# Patient Record
Sex: Female | Born: 1950 | Race: White | Hispanic: No | Smoking: Current every day smoker
Health system: Southern US, Community
[De-identification: ages and names within clinical notes are randomized; demographics above are authoritative.]

## PROBLEM LIST (undated history)

## (undated) DIAGNOSIS — G4700 Insomnia, unspecified: Secondary | ICD-10-CM

## (undated) DIAGNOSIS — K5090 Crohn's disease, unspecified, without complications: Secondary | ICD-10-CM

## (undated) DIAGNOSIS — I472 Ventricular tachycardia: Secondary | ICD-10-CM

## (undated) DIAGNOSIS — F32A Depression, unspecified: Secondary | ICD-10-CM

## (undated) DIAGNOSIS — J449 Chronic obstructive pulmonary disease, unspecified: Secondary | ICD-10-CM

## (undated) DIAGNOSIS — Z932 Ileostomy status: Secondary | ICD-10-CM

## (undated) DIAGNOSIS — I4729 Other ventricular tachycardia: Secondary | ICD-10-CM

## (undated) DIAGNOSIS — E46 Unspecified protein-calorie malnutrition: Secondary | ICD-10-CM

## (undated) DIAGNOSIS — F1910 Other psychoactive substance abuse, uncomplicated: Secondary | ICD-10-CM

## (undated) DIAGNOSIS — F329 Major depressive disorder, single episode, unspecified: Secondary | ICD-10-CM

## (undated) DIAGNOSIS — K529 Noninfective gastroenteritis and colitis, unspecified: Secondary | ICD-10-CM

## (undated) DIAGNOSIS — J440 Chronic obstructive pulmonary disease with acute lower respiratory infection: Secondary | ICD-10-CM

## (undated) DIAGNOSIS — F1190 Opioid use, unspecified, uncomplicated: Secondary | ICD-10-CM

## (undated) DIAGNOSIS — G4701 Insomnia due to medical condition: Secondary | ICD-10-CM

## (undated) HISTORY — DX: Unspecified protein-calorie malnutrition: E46

## (undated) HISTORY — DX: Insomnia, unspecified: G47.00

## (undated) HISTORY — DX: Other ventricular tachycardia: I47.29

## (undated) HISTORY — DX: Insomnia due to medical condition: G47.01

## (undated) HISTORY — DX: Other psychoactive substance abuse, uncomplicated: F19.10

## (undated) HISTORY — PX: COLOSTOMY: SHX63

## (undated) HISTORY — PX: COLON STRICTURE DILATATION: SHX1372

## (undated) HISTORY — DX: Crohn's disease, unspecified, without complications: K50.90

## (undated) HISTORY — PX: TUBAL LIGATION: SHX77

## (undated) HISTORY — DX: Major depressive disorder, single episode, unspecified: F32.9

## (undated) HISTORY — DX: Depression, unspecified: F32.A

## (undated) HISTORY — PX: BOWEL RESECTION: SHX1257

## (undated) HISTORY — PX: OOPHORECTOMY: SHX86

## (undated) HISTORY — DX: Ventricular tachycardia: I47.2

---

## 1979-01-14 DIAGNOSIS — K5090 Crohn's disease, unspecified, without complications: Secondary | ICD-10-CM

## 1979-01-14 HISTORY — PX: COLECTOMY: SHX59

## 1979-01-14 HISTORY — DX: Crohn's disease, unspecified, without complications: K50.90

## 1999-02-26 ENCOUNTER — Encounter: Payer: Self-pay | Admitting: Internal Medicine

## 1999-02-26 ENCOUNTER — Ambulatory Visit (HOSPITAL_COMMUNITY): Admission: RE | Admit: 1999-02-26 | Discharge: 1999-02-26 | Payer: Self-pay | Admitting: Internal Medicine

## 2000-02-28 ENCOUNTER — Encounter: Admission: RE | Admit: 2000-02-28 | Discharge: 2000-02-28 | Payer: Self-pay | Admitting: Family Medicine

## 2000-02-28 ENCOUNTER — Encounter: Payer: Self-pay | Admitting: Family Medicine

## 2000-02-29 ENCOUNTER — Ambulatory Visit (HOSPITAL_COMMUNITY): Admission: RE | Admit: 2000-02-29 | Discharge: 2000-02-29 | Payer: Self-pay | Admitting: Family Medicine

## 2000-03-10 ENCOUNTER — Emergency Department (HOSPITAL_COMMUNITY): Admission: EM | Admit: 2000-03-10 | Discharge: 2000-03-10 | Payer: Self-pay | Admitting: Emergency Medicine

## 2002-04-22 ENCOUNTER — Encounter: Payer: Self-pay | Admitting: Emergency Medicine

## 2002-04-22 ENCOUNTER — Emergency Department (HOSPITAL_COMMUNITY): Admission: EM | Admit: 2002-04-22 | Discharge: 2002-04-22 | Payer: Self-pay | Admitting: Emergency Medicine

## 2003-05-14 ENCOUNTER — Emergency Department (HOSPITAL_COMMUNITY): Admission: EM | Admit: 2003-05-14 | Discharge: 2003-05-14 | Payer: Self-pay | Admitting: Emergency Medicine

## 2003-08-05 ENCOUNTER — Inpatient Hospital Stay (HOSPITAL_COMMUNITY): Admission: EM | Admit: 2003-08-05 | Discharge: 2003-08-07 | Payer: Self-pay | Admitting: Emergency Medicine

## 2003-08-24 ENCOUNTER — Encounter: Admission: RE | Admit: 2003-08-24 | Discharge: 2003-08-24 | Payer: Self-pay | Admitting: Internal Medicine

## 2004-02-16 ENCOUNTER — Emergency Department (HOSPITAL_COMMUNITY): Admission: EM | Admit: 2004-02-16 | Discharge: 2004-02-16 | Payer: Self-pay | Admitting: Emergency Medicine

## 2004-06-08 ENCOUNTER — Emergency Department (HOSPITAL_COMMUNITY): Admission: EM | Admit: 2004-06-08 | Discharge: 2004-06-08 | Payer: Self-pay | Admitting: Family Medicine

## 2004-08-17 ENCOUNTER — Emergency Department (HOSPITAL_COMMUNITY): Admission: EM | Admit: 2004-08-17 | Discharge: 2004-08-17 | Payer: Self-pay | Admitting: Family Medicine

## 2004-09-28 ENCOUNTER — Emergency Department (HOSPITAL_COMMUNITY): Admission: EM | Admit: 2004-09-28 | Discharge: 2004-09-28 | Payer: Self-pay | Admitting: Emergency Medicine

## 2007-01-16 ENCOUNTER — Emergency Department (HOSPITAL_COMMUNITY): Admission: EM | Admit: 2007-01-16 | Discharge: 2007-01-16 | Payer: Self-pay | Admitting: Family Medicine

## 2009-01-11 ENCOUNTER — Emergency Department (HOSPITAL_COMMUNITY): Admission: EM | Admit: 2009-01-11 | Discharge: 2009-01-11 | Payer: Self-pay | Admitting: Family Medicine

## 2009-09-06 ENCOUNTER — Encounter (INDEPENDENT_AMBULATORY_CARE_PROVIDER_SITE_OTHER): Payer: Self-pay | Admitting: *Deleted

## 2009-09-06 ENCOUNTER — Ambulatory Visit: Payer: Self-pay | Admitting: Internal Medicine

## 2009-09-07 ENCOUNTER — Inpatient Hospital Stay (HOSPITAL_COMMUNITY): Admission: EM | Admit: 2009-09-07 | Discharge: 2009-09-08 | Payer: Self-pay | Admitting: Emergency Medicine

## 2009-09-07 ENCOUNTER — Encounter (INDEPENDENT_AMBULATORY_CARE_PROVIDER_SITE_OTHER): Payer: Self-pay | Admitting: Internal Medicine

## 2009-09-07 DIAGNOSIS — K5090 Crohn's disease, unspecified, without complications: Secondary | ICD-10-CM

## 2009-09-09 DIAGNOSIS — N390 Urinary tract infection, site not specified: Secondary | ICD-10-CM

## 2009-09-14 ENCOUNTER — Encounter: Payer: Self-pay | Admitting: Internal Medicine

## 2009-11-09 ENCOUNTER — Ambulatory Visit: Payer: Self-pay | Admitting: Internal Medicine

## 2009-11-09 DIAGNOSIS — R112 Nausea with vomiting, unspecified: Secondary | ICD-10-CM | POA: Insufficient documentation

## 2009-11-09 DIAGNOSIS — M810 Age-related osteoporosis without current pathological fracture: Secondary | ICD-10-CM | POA: Insufficient documentation

## 2009-11-09 DIAGNOSIS — R109 Unspecified abdominal pain: Secondary | ICD-10-CM | POA: Insufficient documentation

## 2009-11-18 LAB — CONVERTED CEMR LAB
Albumin: 4 g/dL (ref 3.5–5.2)
CO2: 20 meq/L (ref 19–32)
Chloride: 106 meq/L (ref 96–112)
Creatinine, Ser: 0.71 mg/dL (ref 0.40–1.20)
Eosinophils Relative: 2 % (ref 0–5)
Glucose, Bld: 81 mg/dL (ref 70–99)
HCT: 49.1 % — ABNORMAL HIGH (ref 36.0–46.0)
Hemoglobin: 16.6 g/dL — ABNORMAL HIGH (ref 12.0–15.0)
MCV: 90 fL (ref >=78.0)
Monocytes Absolute: 0.4 10*3/uL (ref 0.1–1.0)
Monocytes Relative: 7 % (ref 3–12)
Neutrophils Relative %: 59 % (ref 43–77)
Platelets: 241 10*3/uL (ref 150–400)
RBC: 5.45 M/uL — ABNORMAL HIGH (ref 3.87–5.11)
RDW: 12.6 % (ref 11.5–15.5)
WBC: 6.2 10*3/uL (ref 4.0–10.5)

## 2009-11-23 ENCOUNTER — Ambulatory Visit (HOSPITAL_COMMUNITY): Admission: RE | Admit: 2009-11-23 | Discharge: 2009-11-23 | Payer: Self-pay | Admitting: Internal Medicine

## 2009-11-23 ENCOUNTER — Encounter: Payer: Self-pay | Admitting: Internal Medicine

## 2009-11-29 ENCOUNTER — Encounter (INDEPENDENT_AMBULATORY_CARE_PROVIDER_SITE_OTHER): Payer: Self-pay | Admitting: *Deleted

## 2009-11-29 ENCOUNTER — Encounter: Admission: RE | Admit: 2009-11-29 | Discharge: 2009-11-29 | Payer: Self-pay | Admitting: Surgery

## 2009-12-15 ENCOUNTER — Telehealth: Payer: Self-pay | Admitting: Internal Medicine

## 2009-12-28 ENCOUNTER — Encounter: Payer: Self-pay | Admitting: Gastroenterology

## 2010-01-03 ENCOUNTER — Telehealth: Payer: Self-pay | Admitting: Gastroenterology

## 2010-01-03 ENCOUNTER — Encounter (INDEPENDENT_AMBULATORY_CARE_PROVIDER_SITE_OTHER): Payer: Self-pay | Admitting: *Deleted

## 2010-01-11 ENCOUNTER — Ambulatory Visit: Payer: Self-pay | Admitting: Internal Medicine

## 2010-01-11 DIAGNOSIS — F4321 Adjustment disorder with depressed mood: Secondary | ICD-10-CM

## 2010-01-31 ENCOUNTER — Encounter (INDEPENDENT_AMBULATORY_CARE_PROVIDER_SITE_OTHER): Payer: Self-pay | Admitting: *Deleted

## 2010-01-31 ENCOUNTER — Ambulatory Visit: Payer: Self-pay | Admitting: Gastroenterology

## 2010-02-01 LAB — CONVERTED CEMR LAB
ALT: 9 units/L (ref 0–35)
BUN: 8 mg/dL (ref 6–23)
CO2: 30 meq/L (ref 19–32)
Chloride: 101 meq/L (ref 96–112)
Creatinine, Ser: 0.6 mg/dL (ref 0.4–1.2)
Glucose, Bld: 95 mg/dL (ref 70–99)
Hemoglobin: 14 g/dL (ref 12.0–15.0)
MCV: 91.4 fL (ref 78.0–100.0)
Monocytes Absolute: 0.4 10*3/uL (ref 0.1–1.0)
Potassium: 4.2 meq/L (ref 3.5–5.1)
RBC: 4.56 M/uL (ref 3.87–5.11)
RDW: 13.9 % (ref 11.5–14.6)
Sodium: 139 meq/L (ref 135–145)
Total Protein: 6.8 g/dL (ref 6.0–8.3)
WBC: 6.6 10*3/uL (ref 4.5–10.5)

## 2010-02-02 ENCOUNTER — Ambulatory Visit: Payer: Self-pay | Admitting: Cardiology

## 2010-02-11 ENCOUNTER — Telehealth: Payer: Self-pay | Admitting: Internal Medicine

## 2010-02-14 ENCOUNTER — Emergency Department (HOSPITAL_COMMUNITY): Admission: EM | Admit: 2010-02-14 | Discharge: 2010-02-15 | Payer: Self-pay | Admitting: Emergency Medicine

## 2010-02-17 ENCOUNTER — Ambulatory Visit (HOSPITAL_COMMUNITY): Admission: RE | Admit: 2010-02-17 | Discharge: 2010-02-17 | Payer: Self-pay | Admitting: Gastroenterology

## 2010-02-17 ENCOUNTER — Ambulatory Visit: Payer: Self-pay | Admitting: Gastroenterology

## 2010-02-17 LAB — HM SIGMOIDOSCOPY

## 2010-02-18 ENCOUNTER — Telehealth: Payer: Self-pay | Admitting: Internal Medicine

## 2010-02-18 ENCOUNTER — Telehealth: Payer: Self-pay | Admitting: Gastroenterology

## 2010-02-22 ENCOUNTER — Ambulatory Visit: Payer: Self-pay | Admitting: Internal Medicine

## 2010-02-22 DIAGNOSIS — H269 Unspecified cataract: Secondary | ICD-10-CM | POA: Insufficient documentation

## 2010-03-01 ENCOUNTER — Encounter: Payer: Self-pay | Admitting: Internal Medicine

## 2010-03-01 ENCOUNTER — Encounter: Payer: Self-pay | Admitting: Gastroenterology

## 2010-03-04 ENCOUNTER — Encounter: Payer: Self-pay | Admitting: Internal Medicine

## 2010-03-30 ENCOUNTER — Telehealth: Payer: Self-pay | Admitting: Internal Medicine

## 2010-04-12 ENCOUNTER — Ambulatory Visit (HOSPITAL_COMMUNITY): Admission: RE | Admit: 2010-04-12 | Discharge: 2010-04-12 | Payer: Self-pay | Admitting: General Surgery

## 2010-04-13 ENCOUNTER — Inpatient Hospital Stay (HOSPITAL_COMMUNITY)
Admission: RE | Admit: 2010-04-13 | Discharge: 2010-04-19 | Payer: Self-pay | Source: Home / Self Care | Admitting: General Surgery

## 2010-04-13 ENCOUNTER — Ambulatory Visit: Payer: Self-pay | Admitting: Cardiology

## 2010-04-13 ENCOUNTER — Encounter (INDEPENDENT_AMBULATORY_CARE_PROVIDER_SITE_OTHER): Payer: Self-pay | Admitting: General Surgery

## 2010-04-13 HISTORY — PX: LAPAROSCOPY: SHX197

## 2010-04-15 ENCOUNTER — Encounter: Payer: Self-pay | Admitting: Cardiovascular Disease

## 2010-04-16 ENCOUNTER — Encounter: Payer: Self-pay | Admitting: Cardiology

## 2010-04-18 ENCOUNTER — Encounter: Payer: Self-pay | Admitting: Cardiovascular Disease

## 2010-04-25 ENCOUNTER — Telehealth (INDEPENDENT_AMBULATORY_CARE_PROVIDER_SITE_OTHER): Payer: Self-pay | Admitting: Radiology

## 2010-04-26 ENCOUNTER — Encounter: Payer: Self-pay | Admitting: Cardiology

## 2010-04-26 ENCOUNTER — Ambulatory Visit: Payer: Self-pay

## 2010-04-26 ENCOUNTER — Encounter (HOSPITAL_COMMUNITY)
Admission: RE | Admit: 2010-04-26 | Discharge: 2010-06-14 | Payer: Self-pay | Source: Home / Self Care | Attending: Internal Medicine | Admitting: Internal Medicine

## 2010-04-29 ENCOUNTER — Encounter: Payer: Self-pay | Admitting: Internal Medicine

## 2010-04-29 ENCOUNTER — Telehealth: Payer: Self-pay | Admitting: Cardiology

## 2010-04-29 ENCOUNTER — Ambulatory Visit: Payer: Self-pay | Admitting: Physician Assistant

## 2010-05-03 ENCOUNTER — Telehealth: Payer: Self-pay | Admitting: *Deleted

## 2010-05-04 ENCOUNTER — Ambulatory Visit: Payer: Self-pay | Admitting: Internal Medicine

## 2010-05-04 DIAGNOSIS — F17200 Nicotine dependence, unspecified, uncomplicated: Secondary | ICD-10-CM

## 2010-05-04 DIAGNOSIS — E46 Unspecified protein-calorie malnutrition: Secondary | ICD-10-CM

## 2010-05-05 ENCOUNTER — Telehealth: Payer: Self-pay | Admitting: Internal Medicine

## 2010-05-10 ENCOUNTER — Ambulatory Visit (HOSPITAL_COMMUNITY): Admission: RE | Admit: 2010-05-10 | Payer: Self-pay | Source: Home / Self Care | Admitting: Internal Medicine

## 2010-05-10 ENCOUNTER — Ambulatory Visit (HOSPITAL_COMMUNITY)
Admission: RE | Admit: 2010-05-10 | Discharge: 2010-05-10 | Payer: Self-pay | Source: Home / Self Care | Attending: Internal Medicine | Admitting: Internal Medicine

## 2010-05-13 ENCOUNTER — Encounter: Payer: Self-pay | Admitting: Internal Medicine

## 2010-05-25 ENCOUNTER — Emergency Department (HOSPITAL_COMMUNITY)
Admission: EM | Admit: 2010-05-25 | Discharge: 2010-05-25 | Payer: Medicare Other | Source: Home / Self Care | Admitting: Emergency Medicine

## 2010-05-25 ENCOUNTER — Encounter: Payer: Self-pay | Admitting: Internal Medicine

## 2010-05-26 ENCOUNTER — Emergency Department (HOSPITAL_COMMUNITY)
Admission: EM | Admit: 2010-05-26 | Discharge: 2010-05-27 | Payer: Self-pay | Source: Home / Self Care | Admitting: Emergency Medicine

## 2010-05-27 ENCOUNTER — Telehealth: Payer: Self-pay | Admitting: Internal Medicine

## 2010-05-27 ENCOUNTER — Emergency Department (HOSPITAL_COMMUNITY)
Admission: EM | Admit: 2010-05-27 | Discharge: 2010-05-27 | Payer: Self-pay | Source: Home / Self Care | Admitting: Emergency Medicine

## 2010-05-30 LAB — URINALYSIS, ROUTINE W REFLEX MICROSCOPIC
Bilirubin Urine: NEGATIVE
Hgb urine dipstick: NEGATIVE
Ketones, ur: NEGATIVE mg/dL
Nitrite: NEGATIVE
Protein, ur: NEGATIVE mg/dL
Specific Gravity, Urine: 1.022 (ref 1.005–1.030)
Urine Glucose, Fasting: 100 mg/dL — AB
Urobilinogen, UA: 0.2 mg/dL (ref 0.0–1.0)
pH: 5.5 (ref 5.0–8.0)

## 2010-05-30 LAB — CBC
HCT: 39.6 % (ref 36.0–46.0)
HCT: 36.8 % (ref 36.0–46.0)
Hemoglobin: 13.1 g/dL (ref 12.0–15.0)
Hemoglobin: 12.1 g/dL (ref 12.0–15.0)
MCH: 30.2 pg (ref 26.0–34.0)
MCH: 30.4 pg (ref 26.0–34.0)
MCHC: 33.1 g/dL (ref 30.0–36.0)
MCHC: 32.9 g/dL (ref 30.0–36.0)
MCV: 91.2 fL (ref 78.0–100.0)
MCV: 92.5 fL (ref 78.0–100.0)
Platelets: 230 10*3/uL (ref 150–400)
Platelets: 277 10*3/uL (ref 150–400)
RBC: 4.34 MIL/uL (ref 3.87–5.11)
RBC: 3.98 MIL/uL (ref 3.87–5.11)
RDW: 13 % (ref 11.5–15.5)
RDW: 13.3 % (ref 11.5–15.5)
WBC: 7.1 10*3/uL (ref 4.0–10.5)
WBC: 7.9 10*3/uL (ref 4.0–10.5)

## 2010-05-30 LAB — BASIC METABOLIC PANEL
BUN: 10 mg/dL (ref 6–23)
CO2: 20 mEq/L (ref 19–32)
Calcium: 9.3 mg/dL (ref 8.4–10.5)
Chloride: 108 mEq/L (ref 96–112)
Creatinine, Ser: 0.73 mg/dL (ref 0.4–1.2)
GFR calc Af Amer: >60 mL/min (ref >60)
GFR calc non Af Amer: >60 mL/min (ref >60)
Glucose, Bld: 179 mg/dL — ABNORMAL HIGH (ref 70–99)
Potassium: 3.5 mEq/L (ref 3.5–5.1)
Sodium: 137 mEq/L (ref 135–145)

## 2010-05-30 LAB — COMPREHENSIVE METABOLIC PANEL
ALT: 12 U/L (ref 0–35)
AST: 26 U/L (ref 0–37)
Albumin: 3.7 g/dL (ref 3.5–5.2)
Alkaline Phosphatase: 99 U/L (ref 39–117)
BUN: 11 mg/dL (ref 6–23)
CO2: 22 mEq/L (ref 19–32)
Calcium: 8.9 mg/dL (ref 8.4–10.5)
Chloride: 105 mEq/L (ref 96–112)
Creatinine, Ser: 0.81 mg/dL (ref 0.4–1.2)
GFR calc Af Amer: >60 mL/min (ref >60)
GFR calc non Af Amer: >60 mL/min (ref >60)
Glucose, Bld: 110 mg/dL — ABNORMAL HIGH (ref 70–99)
Potassium: 3 mEq/L — ABNORMAL LOW (ref 3.5–5.1)
Sodium: 137 mEq/L (ref 135–145)
Total Bilirubin: 0.6 mg/dL (ref 0.3–1.2)
Total Protein: 6.7 g/dL (ref 6.0–8.3)

## 2010-05-30 LAB — DIFFERENTIAL
Basophils Absolute: 0.1 10*3/uL (ref 0.0–0.1)
Basophils Absolute: 0 10*3/uL (ref 0.0–0.1)
Basophils Relative: 1 % (ref 0–1)
Basophils Relative: 1 % (ref 0–1)
Eosinophils Absolute: 0.1 10*3/uL (ref 0.0–0.7)
Eosinophils Absolute: 0.1 10*3/uL (ref 0.0–0.7)
Eosinophils Relative: 1 % (ref 0–5)
Eosinophils Relative: 2 % (ref 0–5)
Lymphocytes Relative: 11 % — ABNORMAL LOW (ref 12–46)
Lymphocytes Relative: 18 % (ref 12–46)
Lymphs Abs: 0.8 10*3/uL (ref 0.7–4.0)
Lymphs Abs: 1.5 10*3/uL (ref 0.7–4.0)
Monocytes Absolute: 0.5 10*3/uL (ref 0.1–1.0)
Monocytes Absolute: 0.6 10*3/uL (ref 0.1–1.0)
Monocytes Relative: 7 % (ref 3–12)
Monocytes Relative: 7 % (ref 3–12)
Neutro Abs: 5.6 10*3/uL (ref 1.7–7.7)
Neutro Abs: 5.7 10*3/uL (ref 1.7–7.7)
Neutrophils Relative %: 80 % — ABNORMAL HIGH (ref 43–77)
Neutrophils Relative %: 72 % (ref 43–77)

## 2010-05-30 LAB — LACTIC ACID, PLASMA: Lactic Acid, Venous: 1 mmol/L (ref 0.5–2.2)

## 2010-05-30 LAB — POCT I-STAT, CHEM 8
BUN: 9 mg/dL (ref 6–23)
Calcium, Ion: 1.04 mmol/L — ABNORMAL LOW (ref 1.12–1.32)
Chloride: 105 mEq/L (ref 96–112)
Creatinine, Ser: 0.7 mg/dL (ref 0.4–1.2)
Glucose, Bld: 87 mg/dL (ref 70–99)
HCT: 36 % (ref 36.0–46.0)
Hemoglobin: 12.2 g/dL (ref 12.0–15.0)
Potassium: 3.2 mEq/L — ABNORMAL LOW (ref 3.5–5.1)
Sodium: 137 mEq/L (ref 135–145)
TCO2: 24 mmol/L (ref 0–100)

## 2010-05-30 LAB — LIPASE, BLOOD: Lipase: 33 U/L (ref 11–59)

## 2010-05-31 ENCOUNTER — Ambulatory Visit
Admission: RE | Admit: 2010-05-31 | Discharge: 2010-05-31 | Payer: Self-pay | Source: Home / Self Care | Attending: Internal Medicine | Admitting: Internal Medicine

## 2010-05-31 DIAGNOSIS — K047 Periapical abscess without sinus: Secondary | ICD-10-CM | POA: Insufficient documentation

## 2010-05-31 DIAGNOSIS — R300 Dysuria: Secondary | ICD-10-CM | POA: Insufficient documentation

## 2010-05-31 LAB — CONVERTED CEMR LAB
Glucose, Urine, Semiquant: NEGATIVE
Lymphocytes Relative: 28 % (ref 12–46)
Lymphs Abs: 1.8 10*3/uL (ref 0.7–4.0)
Neutrophils Relative %: 51 % (ref 43–77)
Nitrite: NEGATIVE
Platelets: 242 10*3/uL (ref 150–400)
Urobilinogen, UA: NEGATIVE
WBC Urine, dipstick: NEGATIVE
WBC: 6.5 10*3/uL (ref 4.0–10.5)
pH: 5

## 2010-06-02 ENCOUNTER — Telehealth: Payer: Self-pay | Admitting: Internal Medicine

## 2010-06-04 ENCOUNTER — Encounter: Payer: Self-pay | Admitting: Internal Medicine

## 2010-06-04 ENCOUNTER — Observation Stay (HOSPITAL_COMMUNITY)
Admission: EM | Admit: 2010-06-04 | Discharge: 2010-06-05 | Payer: Self-pay | Source: Home / Self Care | Admitting: Emergency Medicine

## 2010-06-05 ENCOUNTER — Encounter: Payer: Self-pay | Admitting: Internal Medicine

## 2010-06-06 ENCOUNTER — Ambulatory Visit (HOSPITAL_COMMUNITY)
Admission: RE | Admit: 2010-06-06 | Discharge: 2010-06-06 | Payer: Self-pay | Source: Home / Self Care | Attending: Internal Medicine | Admitting: Internal Medicine

## 2010-06-07 ENCOUNTER — Ambulatory Visit (HOSPITAL_COMMUNITY)
Admission: RE | Admit: 2010-06-07 | Discharge: 2010-06-07 | Payer: Self-pay | Source: Home / Self Care | Attending: Internal Medicine | Admitting: Internal Medicine

## 2010-06-07 LAB — URINE MICROSCOPIC-ADD ON

## 2010-06-07 LAB — CBC
Hemoglobin: 11.4 g/dL — ABNORMAL LOW (ref 12.0–15.0)
MCH: 29 pg (ref 26.0–34.0)
MCHC: 30.7 g/dL (ref 30.0–36.0)
Platelets: 169 10*3/uL (ref 150–400)
RBC: 3.93 MIL/uL (ref 3.87–5.11)
RDW: 12.9 % (ref 11.5–15.5)

## 2010-06-07 LAB — DIFFERENTIAL
Eosinophils Relative: 11 % — ABNORMAL HIGH (ref 0–5)
Monocytes Absolute: 0.5 10*3/uL (ref 0.1–1.0)
Monocytes Relative: 10 % (ref 3–12)
Neutro Abs: 2.1 10*3/uL (ref 1.7–7.7)

## 2010-06-07 LAB — URINALYSIS, ROUTINE W REFLEX MICROSCOPIC
Ketones, ur: NEGATIVE mg/dL
Specific Gravity, Urine: 1.028 (ref 1.005–1.030)
Urobilinogen, UA: 0.2 mg/dL (ref 0.0–1.0)

## 2010-06-07 LAB — POCT I-STAT, CHEM 8
Chloride: 104 mEq/L (ref 96–112)
Creatinine, Ser: 0.7 mg/dL (ref 0.4–1.2)
Hemoglobin: 14.6 g/dL (ref 12.0–15.0)
Potassium: 3.9 mEq/L (ref 3.5–5.1)
TCO2: 26 mmol/L (ref 0–100)

## 2010-06-07 LAB — COMPREHENSIVE METABOLIC PANEL
ALT: 19 U/L (ref 0–35)
CO2: 25 mEq/L (ref 19–32)
Chloride: 105 mEq/L (ref 96–112)
GFR calc Af Amer: >60 mL/min (ref >60)
GFR calc non Af Amer: >60 mL/min (ref >60)
Sodium: 136 mEq/L (ref 135–145)

## 2010-06-08 ENCOUNTER — Telehealth: Payer: Self-pay | Admitting: *Deleted

## 2010-06-14 ENCOUNTER — Ambulatory Visit
Admission: RE | Admit: 2010-06-14 | Discharge: 2010-06-14 | Payer: Self-pay | Source: Home / Self Care | Attending: Internal Medicine | Admitting: Internal Medicine

## 2010-06-14 NOTE — Assessment & Plan Note (Signed)
Summary: routine f/u//kg   Vital Signs:  Patient profile:   60 year old female Height:      64.25 inches (163.19 cm) Weight:      111.2 pounds (50.55 kg) BMI:     19.01 Temp:     98.2 degrees F (36.78 degrees C) oral Pulse rate:   72 / minute BP sitting:   122 / 72  (left arm) Cuff size:   regular  Vitals Entered By: Cynda Familia Duncan Dull) (January 11, 2010 11:43 AM) CC: f/u stoma, not eating well, Is Patient Diabetic? No Pain Assessment Patient in pain? yes     Location: "stoma pain" Intensity: 4 Type: burning Onset of pain  Constant Nutritional Status BMI of 19 -24 = normal  Have you ever been in a relationship where you felt threatened, hurt or afraid?No   Does patient need assistance? Functional Status Self care Ambulation Normal   CC:  f/u stoma, not eating well, and .  History of Present Illness: 60 year old patient who comes in for 2 month follow up. Since I last saw her she has had all the health maintenance we scheduled and followed up with Dr. Gaston Islam.  Her bone density showed as expected severe osteoporosis with her long term steroid use and I have started her on fosamax which she is tolereating without problem. Her mamogram was fine.   After seeing Dr. Gaston Islam she had a barium enema which was unremarkable, then saw his partner, Dr. Rush Farmer. He has sent her to GI, Dr. Christella Hartigan, who is scheduled to see her 01/31/10. She is frustrated that is taking so long to get to surgery and we discussed at length today why that was neccessary.  She is not sleeping-able to fall asleep-but awakening after 2 hours, she is hardly eating because it causes pain and has lost 7 more pounds. She eats one meal in the evening after taking 1-2 vicodin. She is often tearful. The belt that was suggested that she use with her ostomy bag is very uncomfortable.  Ms. Kosinski does have a history of depression, again relating to her medical illness and thinks she was on wellbutrin in the past. That  drug was also chosen in hopes of tobacco cessation.  Depression History:      The patient denies a depressed mood most of the day and a diminished interest in her usual daily activities.         Preventive Screening-Counseling & Management  Alcohol-Tobacco     Smoking Status: current     Smoking Cessation Counseling: yes     Packs/Day: 2.0     Year Started: started at 87  Current Medications (verified): 1)  Promethazine Hcl 25 Mg Tabs (Promethazine Hcl) .... Take 1 Tablet By Mouth Every 6 Hours As Needed For Nausea 2)  Hydrocodone-Acetaminophen 5-500 Mg Tabs (Hydrocodone-Acetaminophen) .... Take 1 Tablet By Mouth Every 6 Hours As Needed For Pain 3)  Fosamax 70 Mg Tabs (Alendronate Sodium) .... Take One Tab Once A Week 4)  Trazodone Hcl 50 Mg Tabs (Trazodone Hcl) .Marland Kitchen.. 1-2 At Night As Needed For Sleep 5)  Citalopram Hydrobromide 40 Mg Tabs (Citalopram Hydrobromide) .... One A Day  Allergies (verified): No Known Drug Allergies  Past History:  Past Surgical History: Reviewed history from 11/09/2009 and no changes required. 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 single, lives with 18 birds, 3 dogs and 2 cats has a very supportive younger brother who also  has Crohn's Current Smoker Alcohol use-no  Review of Systems       as per HPI  Physical Exam  General:  alert.   Head:  normocephalic and atraumatic.   Eyes:  vision grossly intact.   Neck:  supple.   Lungs:  normal respiratory effort and normal breath sounds.   Heart:  normal rate and regular rhythm.   Abdomen:  not examined, wearing ostomy bag with belt Extremities:  no edema Psych:  Oriented X3, memory intact for recent and remote, and depressed affect.     Impression & Recommendations:  Problem # 1:  DEPRESSION, SITUATIONAL (ICD-309.0) I talked at length with patient about this. Before she gets more down, we decides to begin citalopram 40mg .  She will take 1/2 a pill a day for 6 days, then begin taking one a day. I have talked about risks, benefits, and side effects. I believe this will also help her sleep after a few weeks. In the meantime I have also begun trazadone 50mg  1-2 at night for sleep as needed.  Problem # 2:  OSTEOPOROSIS (ICD-733.00) Assessment: Improved She is tolerating the fosamax well. Her updated medication list for this problem includes:    Fosamax 70 Mg Tabs (Alendronate sodium) .Marland Kitchen... Take one tab once a week  Problem # 3:  CROHN'S DISEASE (ICD-555.9) At this point I am giving her 60 vicidin a month, and I believe this will be short term until after her surgery. I believe her pain is more structural than due to active disease.  Problem # 4:  NAUSEA AND VOMITING (ICD-787.01) Phenergan is refilled today.  Problem # 5:  Preventive Health Care (ICD-V70.0) She did not want a flu shot today. Otherwise she is up to date.  Complete Medication List: 1)  Promethazine Hcl 25 Mg Tabs (Promethazine hcl) .... Take 1 tablet by mouth every 6 hours as needed for nausea 2)  Hydrocodone-acetaminophen 5-500 Mg Tabs (Hydrocodone-acetaminophen) .... Take 1 tablet by mouth every 6 hours as needed for pain 3)  Fosamax 70 Mg Tabs (Alendronate sodium) .... Take one tab once a week 4)  Trazodone Hcl 50 Mg Tabs (Trazodone hcl) .Marland Kitchen.. 1-2 at night as needed for sleep 5)  Citalopram Hydrobromide 40 Mg Tabs (Citalopram hydrobromide) .... One a day  Patient Instructions: 1)  Please schedule a follow-up appointment in 1 month. 2)  I have started you on citalopram and trazadone. Prescriptions: PROMETHAZINE HCL 25 MG TABS (PROMETHAZINE HCL) Take 1 tablet by mouth every 6 hours as needed for nausea  #60 x 2   Entered and Authorized by:   Zoila Shutter MD   Signed by:   Zoila Shutter MD on 01/11/2010   Method used:   [facility]  [phone]* (retail)       [address] [place], [place]   [address]       Ph: [phone] or [phone]       Fax: [phone]   RxID:   [id number] CITALOPRAM HYDROBROMIDE 40 MG TABS (CITALOPRAM HYDROBROMIDE) one a day  #31 x 6   Entered and Authorized by:   Zoila Shutter MD   Signed by:   Zoila Shutter MD on 01/11/2010   Method used:   [facility]        [facility]  [address]  [phone]* (retail)       [address] [place]  [place], [place]  [address]       Ph: [phone] or [phone]       Fax: [phone]   RxID:   [id number] TRAZODONE HCL 50 MG TABS (TRAZODONE HCL) 1-2 at night as needed for sleep  #60 x 6   Entered and Authorized by:   Zoila Shutter MD   Signed by:   Zoila Shutter MD on 01/11/2010   Method used:   [facility]        [facility]  [phone]* (retail)       [address] [place], [place]  [address]       Ph: [phone] or [phone]       Fax: [phone]   RxID:   [id number]    Prevention & Chronic Care Immunizations   Influenza vaccine: Not documented    Tetanus booster: 01/13/2009: Historical    Pneumococcal vaccine: Historical  (08/13/2009)  Colorectal Screening   Hemoccult: Not documented    Colonoscopy: Not documented  Other Screening   Pap smear: Not documented    Mammogram: ASSESSMENT: Negative - BI-RADS 1^MM DIGITAL SCREENING  (11/23/2009)   Mammogram action/deferral: Ordered  (11/09/2009)   Smoking status: current  (01/11/2010)   Smoking cessation counseling: yes  (01/11/2010)  Lipids   Total Cholesterol: Not documented   LDL: Not documented   LDL Direct: Not documented   HDL: Not documented   Triglycerides: Not documented

## 2010-06-14 NOTE — Progress Notes (Signed)
Summary: sooner appt  Phone Note From Other Clinic Call back at [phone]   Caller: Helmut Muster from [facility] Dr Dwain Sarna Call For: Dr Christella Hartigan Reason for Call: Schedule Patient Appt Summary of Call: Helmut Muster states that patient wants to be seen sooner than first available appt (oct) for problems with Iliostomy bag and Crohns. Initial call taken by: Tawni Levy,  January 03, 2010 9:30 AM  Follow-up for Phone Call        Pt of Dr Steele Sizer at [facility] last seen 05.  Called Helmut Muster to see why the pt is switching care.  Left message with nurse to have her call me  Follow-up by: Chales Abrahams CMA Duncan Dull),  January 03, 2010 10:54 AM  Additional Follow-up for Phone Call Additional follow up Details #1::        Spoke with Helmut Muster and she is going to try and get the pt back in with Dr Lilli Light she will call back with any questions Additional Follow-up by: Chales Abrahams CMA Duncan Dull),  January 03, 2010 11:09 AM     Appended Document: sooner appt Helmut Muster called back and Dr Christella Hartigan has spoken with Dr Dwain Sarna about the pt so she has been scheduled

## 2010-06-14 NOTE — Procedures (Signed)
Summary: Ileoscopy vis Ostomy  Patient: [name] Note: All result statuses are Final unless otherwise noted.  Tests: (1) Flexible Sigmoidoscopy (FLX)  FLX Flexible Sigmoidoscopy                             DONE (C)     [facility]     [address] [place], [place]  [address]           FLEXIBLE ILEOSCOPY PROCEDURE REPORT           PATIENT:  Misty [name], Misty [name]  MR#:  [id number]     BIRTHDATE:  13-Feb-1951, 59 yrs. old  GENDER:  female     ENDOSCOPIST:  Rachael Fee, MD     Referred by:  Emelia Loron, M.D.     PROCEDURE DATE:  02/17/2010     PROCEDURE:  ileoscopy via ostomy     ASA CLASS:  Class III     INDICATIONS:  long history of crohn's, now with ileostomy, pain at     ileostomy     MEDICATIONS:   MAC sedation, administered by CRNA           DESCRIPTION OF PROCEDURE:   After the risks benefits and     alternatives of the procedure were thoroughly explained, informed     consent was obtained.  No rectal exam performed. The [id number]     ([id number]), [id number] ([id number]) and [id number] ([id number]) endoscope was     introduced through the ostomy and advanced 40cm into the ileum,     without limitations.  Unprepped.  The instrument was then slowly     withdrawn as the mucosa was fully examined.     <<PROCEDUREIMAGES>>     A pediatric gastroscope was used to inspect the distal 40cm of     ileum, via ileostomy. The distal most 5-6cm of the neo-terminal     ileum was mildly inflammed, ulcerated. There was a focal, fibrotic     appearing stricture approximately 5cm proximal to ostomy that     narrowed lumen to 5mm (could not pass 9mm adult gastroscope, but     pediatric scope passed quite easily). Biopsies were taken from     normal ileum as well as from the more overtly inflammed     distal-most 5cm of ileum (see image003, image004, image005,     image007, image009, and image010).   Retroflexed views in the     rectum revealed no abnormalities.    The scope was  then withdrawn     from the patient and the procedure terminated.     COMPLICATIONS:  None           ENDOSCOPIC IMPRESSION:     Distal 5-6cm of neo-terminal ileum was inflamed mildly. There     was also a fibrotic appearing stricture 5cm proximal to ostomy     site.  The stricture narrowed lumen to 5mm, focally.  Biopsies     taken from normal and abnormal distal ileum.           RECOMMENDATIONS:     Await final biopsies.  Will discuss with Dr. Dwain Sarna.     She knows that smoking is very bad for Crohn's (stopping smoking     is felt to be as effective as starting steriods for Crohn's     control), will continue to try to stop.  ______________________________     Rachael Fee, MD           n.     REVISED:  02/17/2010 02:28 PM     eSIGNED:   Rachael Fee at 02/17/2010 02:28 PM           Curt Jews, [id number]  Note: An exclamation mark (!) indicates a result that was not dispersed into the flowsheet. Document Creation Date: 02/17/2010 2:29 PM _______________________________________________________________________  (1) Order result status: Final Collection or observation date-time: 02/17/2010 14:11 Requested date-time:  Receipt date-time:  Reported date-time:  Referring Physician:   Ordering Physician: Rob Bunting [phone]) Specimen Source:  Source: Launa Grill Order Number: [id number] Lab site:

## 2010-06-14 NOTE — Progress Notes (Signed)
Summary: Question for Dr Christella Hartigan  Phone Note Call from Patient Call back at [facility] Phone [phone]   Caller: Patient Call For: Dr. Christella Hartigan Reason for Call: Talk to Nurse Summary of Call: pt. got a ticket for not wearing a seat belt. Was told by attorney if she had a note for her Crohn's disease she would not have to pay for it. Initial call taken by: Karna Christmas,  February 18, 2010 3:56 PM  Follow-up for Phone Call        Dr Christella Hartigan this pt wants a note saying that Crohns is preventing her from wearing a seat belt.  Chales Abrahams CMA Duncan Dull)  February 18, 2010 4:03 PM  Follow-up by: Rachael Fee MD,  February 18, 2010 4:11 PM  Additional Follow-up for Phone Call Additional follow up Details #1::        no Additional Follow-up by: Rachael Fee MD,  February 18, 2010 4:11 PM    Additional Follow-up for Phone Call Additional follow up Details #2::    pt aware  Follow-up by: Chales Abrahams CMA Duncan Dull),  February 18, 2010 4:18 PM

## 2010-06-14 NOTE — Procedures (Signed)
Summary: Preparation for Flex Sigmoid / [facility]  Preparation for Flex Sigmoid / [facility]   Imported By: Lennie Odor 02/01/2010 14:58:21  _____________________________________________________________________  External Attachment:    Type:   Image     Comment:   External Document

## 2010-06-14 NOTE — Letter (Signed)
Summary: External Correspondence-[facility]  External Correspondence-[facility]   Imported By: Louretta Parma 09/14/2009 15:24:45  _____________________________________________________________________  External Attachment:    Type:   Image     Comment:   External Document

## 2010-06-14 NOTE — Progress Notes (Signed)
Summary: med refillgp  Phone Note Refill Request Message from:  Fax from Pharmacy on March 30, 2010 4:36 PM  Refills Requested: Medication #1:  HYDROCODONE-ACETAMINOPHEN 5-500 MG TABS Take 1 tablet by mouth every 6 hours as needed for pain   Last Refilled: 03/07/2010  Method Requested: Telephone to Pharmacy Initial call taken by: Chinita Pester RN,  March 30, 2010 4:37 PM  Follow-up for Phone Call        [facility] Follow-up by: Chinita Pester RN,  March 31, 2010 12:18 PM    Prescriptions: HYDROCODONE-ACETAMINOPHEN 5-500 MG TABS (HYDROCODONE-ACETAMINOPHEN) Take 1 tablet by mouth every 6 hours as needed for pain  #60 x 1   Entered and Authorized by:   Zoila Shutter MD   Signed by:   Zoila Shutter MD on 03/30/2010   Method used:   Telephoned to ...       [facility]  [phone]* (retail)       [address] [place], [place]  [address]       Ph: [phone] or [phone]       Fax: [phone]   RxID:   [id number]   Appended Document: med refillgp Pt states she went by to pick up med and it was not called in.  Pharmacy was contacted and rx was called in, pt aware.  Pt will also have surgery on Nov 30th with [facility]

## 2010-06-14 NOTE — Discharge Summary (Signed)
Summary: Hospital Discharge Update    Hospital Discharge Update:  Date of Admission: 09/06/2009 Date of Discharge: 09/08/2009  Brief Summary:  Patient is a 60 yo F with a PMH of Crohn's disease s/p total colectomy admitted for abdominal pain at the ostomy site. Likely secondary to chemical irritation given that patient has insufficient material resources and was not changing ostomy bags frequently. Patient also had nausea and vomiting that was resolved at the time of discharge, but she was sent out with a script for Phenergan. She is on no medications for Crohn's and an abdominal CT was performed with no evidence of Crohn's flare.  Labs needed at follow-up: Basic metabolic panel  Other follow-up issues:  Please request records from [facility] and her surgeon mentioned in [place] records (he is here in [place]) as the patient was seen there in the past. She would also like a referral to a local GI practice as the trip to [facility] is too far for her now. Please ask about recurrent nausea and/or vomiting and check BMET.  Problem list changes:  Added new problem of CROHN'S DISEASE (ICD-555.9) Added new problem of DEPRESSION, HX OF (ICD-V11.8)  Medication list changes:  Added new medication of PROMETHAZINE HCL 25 MG TABS (PROMETHAZINE HCL) Take 1 tablet by mouth every 6 hours as needed for nausea - Signed Added new medication of HYDROCODONE-ACETAMINOPHEN 5-500 MG TABS (HYDROCODONE-ACETAMINOPHEN) Take 1 tablet by mouth every 6 hours as needed for pain - Signed Rx of PROMETHAZINE HCL 25 MG TABS (PROMETHAZINE HCL) Take 1 tablet by mouth every 6 hours as needed for nausea;  #60 x 0;  Signed;  Entered by: Nilda Riggs MD;  Authorized by: Nilda Riggs MD;  Method used: Handwritten Rx of HYDROCODONE-ACETAMINOPHEN 5-500 MG TABS (HYDROCODONE-ACETAMINOPHEN) Take 1 tablet by mouth every 6 hours as needed for pain;  #60 x 0;  Signed;  Entered by: Nilda Riggs MD;  Authorized by: Nilda Riggs MD;   Method used: Handwritten  The medication, problem, and allergy lists have been updated.  Please see the dictated discharge summary for details.  Discharge medications:  PROMETHAZINE HCL 25 MG TABS (PROMETHAZINE HCL) Take 1 tablet by mouth every 6 hours as needed for nausea HYDROCODONE-ACETAMINOPHEN 5-500 MG TABS (HYDROCODONE-ACETAMINOPHEN) Take 1 tablet by mouth every 6 hours as needed for pain  Other patient instructions:  You have an appointment in the [facility] with Dr. Coralee Pesa on May 11th at 8:30 am. Please remember to bring your medications with you to the appointment. If you experience any worsening of symptoms or intractable nausea and vomiting, please call the clinic at [phone] or seek medical attention immediately if the symptoms are severe.  Note: Hospital Discharge Medications & Other Instructions handout was printed, one copy for patient and a second copy to be placed in hospital chart.  Prescriptions: HYDROCODONE-ACETAMINOPHEN 5-500 MG TABS (HYDROCODONE-ACETAMINOPHEN) Take 1 tablet by mouth every 6 hours as needed for pain  #60 x 0   Entered and Authorized by:   Nilda Riggs MD   Signed by:   Nilda Riggs MD on 09/08/2009   Method used:   Handwritten   RxID:   [id number] PROMETHAZINE HCL 25 MG TABS (PROMETHAZINE HCL) Take 1 tablet by mouth every 6 hours as needed for nausea  #60 x 0   Entered and Authorized by:   Nilda Riggs MD   Signed by:   Nilda Riggs MD on 09/08/2009   Method used:   Handwritten   RxID:  [id number]   Appended Document: Hospital Discharge Update ESR was drawn prior to d/c and was 11 (normal). CRP was pending.  Appended Document: Hospital Discharge Update   Impression & Recommendations:  Problem # 1:  UTI (ICD-599.0)  Cipro called in to patient's pharmacy for 4 more days of treatment.  Her updated medication list for this problem includes:    Ciprofloxacin Hcl 500 Mg Tabs (Ciprofloxacin hcl) .Marland Kitchen... Take 1  tablet by mouth two times a day  Complete Medication List: 1)  Promethazine Hcl 25 Mg Tabs (Promethazine hcl) .... Take 1 tablet by mouth every 6 hours as needed for nausea 2)  Hydrocodone-acetaminophen 5-500 Mg Tabs (Hydrocodone-acetaminophen) .... Take 1 tablet by mouth every 6 hours as needed for pain 3)  Ciprofloxacin Hcl 500 Mg Tabs (Ciprofloxacin hcl) .... Take 1 tablet by mouth two times a day Prescriptions: CIPROFLOXACIN HCL 500 MG TABS (CIPROFLOXACIN HCL) Take 1 tablet by mouth two times a day  #8 x 0   Entered and Authorized by:   Nilda Riggs MD   Signed by:   Nilda Riggs MD on 09/09/2009   Method used:   [facility]        [facility] (retail)       [address]       [place], [place]  [address]       Ph: [phone] or [phone]       Fax: [phone]   RxID:   [id number]

## 2010-06-14 NOTE — Letter (Signed)
Summary: [facility]  [address] [place], [place] [address]   Phone: [phone]  Fax: [phone]       [facility]    1951/03/06    MRN: [id number]        Procedure Day /Date:02/17/10 THURS     Arrival Time:1230 pm     Procedure Time:130 pm     Location of Procedure:                     X  [facility] ( Outpatient Registration)      PREPARATION FOR FLEXIBLE SIGMOIDOSCOPY WITH MAGNESIUM CITRATE  Prior to the day before your procedure, purchase one 8 oz. bottle of Magnesium Citrate   from the laxative section of your drugstore.  _________________________________________________________________________________________________  THE DAY BEFORE YOUR PROCEDURE             DATE: 02/16/10      DAY:WED  1.   Have a clear liquid dinner the night before your procedure.  2.   Do not drink anything colored red or purple.  Avoid juices with pulp.  No orange juice.              CLEAR LIQUIDS INCLUDE: Water Jello Ice Popsicles Tea (sugar ok, no milk/cream) Powdered fruit flavored drinks Coffee (sugar ok, no milk/cream) Gatorade Juice: apple, white grape, white cranberry  Lemonade Clear bullion, consomm, broth Carbonated beverages (any kind) Strained chicken noodle soup Hard Candy   3.   At 7:00 pm the night before your procedure, drink one bottle of Magnesium Citrate over ice.  4.   Drink at least 3 more glasses of clear liquids before bedtime (preferably juices).  5.   Results are expected usually within 1 to 6 hours after taking the Magnesium Citrate.  ___________________________________________________________________________________________________  THE DAY OF YOUR PROCEDURE            DATE: 02/17/10     DAY: THURS    1.   You may drink clear liquids until 930 am (4 hours before exam)       MEDICATION INSTRUCTIONS  Unless otherwise instructed, you should take regular prescription medications with a small sip  of water as early as possible the morning of your procedure.         OTHER INSTRUCTIONS  You will need a responsible adult at least 59 years of age to accompany you and drive you home.   This person must remain in the waiting room during your procedure.  Wear loose fitting clothing that is easily removed.  Leave jewelry and other valuables at home.  However, you may wish to bring a book to read or an iPod/MP3 player to listen to music as you wait for your procedure to start.  Remove all body piercing jewelry and leave at home.  Total time from sign-in until discharge is approximately 2-3 hours.  You should go home directly after your procedure and rest.  You can resume normal activities the day after your procedure.  The day of your procedure you should not:   Drive   Make legal decisions   Operate machinery   Drink alcohol   Return to work  You will receive specific instructions about eating, activities and medications before you leave.   The above instructions have been reviewed and explained to me by   _______________________    I fully understand and can verbalize these instructions _____________________________ Date _________

## 2010-06-14 NOTE — Letter (Signed)
Summary: New Patient letter  [facility]  [address] [place], [place] [address]   Phone: [phone]  Fax: [phone]       01/03/2010 MRN: [id number]  [facility] [place] [address] [place], [place]  [address]  Dear Misty [name],  Welcome to the Gastroenterology Division at [facility].    You are scheduled to see Dr.  Christella Hartigan  on 01/31/10 at 10:15 am on the 3rd floor at [facility], [address].  We ask that you try to arrive at our office 15 minutes prior to your appointment time to allow for check-in.  We would like you to complete the enclosed self-administered evaluation form prior to your visit and bring it with you on the day of your appointment.  We will review it with you.  Also, please bring a complete list of all your medications or, if you prefer, bring the medication bottles and we will list them.  Please bring your insurance card so that we may make a copy of it.  If your insurance requires a referral to see a specialist, please bring your referral form from your primary care physician.  Co-payments are due at the time of your visit and may be paid by cash, check or credit card.     Your office visit will consist of a consult with your physician (includes a physical exam), any laboratory testing he/she may order, scheduling of any necessary diagnostic testing (e.g. x-ray, ultrasound, CT-scan), and scheduling of a procedure (e.g. Endoscopy, Colonoscopy) if required.  Please allow enough time on your schedule to allow for any/all of these possibilities.    If you cannot keep your appointment, please call [phone] to cancel or reschedule prior to your appointment date.  This allows [place] the opportunity to schedule an appointment for another patient in need of care.  If you do not cancel or reschedule by 5 p.m. the business day prior to your appointment date, you will be charged a $50.00 late cancellation/no-show fee.    Thank you for choosing  [facility] for your medical needs.  We appreciate the opportunity to care for you.  Please visit [place] at our website  to learn more about our practice.                     Sincerely,                                                             The Gastroenterology Division

## 2010-06-14 NOTE — Consult Note (Signed)
Summary: [facility]  [facility]   Imported By: Louretta Parma 03/18/2010 15:18:54  _____________________________________________________________________  External Attachment:    Type:   Image     Comment:   External Document

## 2010-06-14 NOTE — Consult Note (Signed)
Summary: Dr. Jamey Ripa  Dr. Jamey Ripa   Imported By: Shon Hough 12/03/2009 14:14:21  _____________________________________________________________________  External Attachment:    Type:   Image     Comment:   External Document

## 2010-06-14 NOTE — Progress Notes (Signed)
Summary: refill/gg  Phone Note Refill Request  on February 11, 2010 2:53 PM  Refills Requested: Medication #1:  HYDROCODONE-ACETAMINOPHEN 5-500 MG TABS Take 1 tablet by mouth every 6 hours as needed for pain   Last Refilled: 01/11/2010  Method Requested: Fax to Local Pharmacy Initial call taken by: Merrie Roof RN,  February 11, 2010 2:54 PM  Follow-up for Phone Call        There were 2 refills when filled 01/11/10 so should be good to 04/13/10. Follow-up by: Zoila Shutter MD,  February 11, 2010 3:46 PM  Additional Follow-up for Phone Call Additional follow up Details #1::        Rx faxed to pharmacy Additional Follow-up by: Merrie Roof RN,  February 14, 2010 3:09 PM    Prescriptions: HYDROCODONE-ACETAMINOPHEN 5-500 MG TABS (HYDROCODONE-ACETAMINOPHEN) Take 1 tablet by mouth every 6 hours as needed for pain  #60 x 1   Entered and Authorized by:   Zoila Shutter MD   Signed by:   Zoila Shutter MD on 02/14/2010   Method used:   Telephoned to ...       [facility]  [phone]* (retail)       [address] [place], [place]  [address]       Ph: [phone] or [phone]       Fax: [phone]   RxID:   [id number]

## 2010-06-14 NOTE — Letter (Signed)
Summary: New Patient letter  [facility]  [address] [place], [place] [address]   Phone: [phone]  Fax: [phone]       12/28/2009 MRN: [id number]  [facility] [address] [place], [place]  [address]  Dear Ms. Alben Spittle,  Welcome to the Gastroenterology Division at [facility].    You are scheduled to see Dr.  Christella Hartigan on  02-15-10 at 8:30am on the 3rd floor at [facility], [address].  We ask that you try to arrive at our office 15 minutes prior to your appointment time to allow for check-in.  We would like you to complete the enclosed self-administered evaluation form prior to your visit and bring it with you on the day of your appointment.  We will review it with you.  Also, please bring a complete list of all your medications or, if you prefer, bring the medication bottles and we will list them.  Please bring your insurance card so that we may make a copy of it.  If your insurance requires a referral to see a specialist, please bring your referral form from your primary care physician.  Co-payments are due at the time of your visit and may be paid by cash, check or credit card.     Your office visit will consist of a consult with your physician (includes a physical exam), any laboratory testing he/she may order, scheduling of any necessary diagnostic testing (e.g. x-ray, ultrasound, CT-scan), and scheduling of a procedure (e.g. Endoscopy, Colonoscopy) if required.  Please allow enough time on your schedule to allow for any/all of these possibilities.    If you cannot keep your appointment, please call [phone] to cancel or reschedule prior to your appointment date.  This allows [place] the opportunity to schedule an appointment for another patient in need of care.  If you do not cancel or reschedule by 5 p.m. the business day prior to your appointment date, you will be charged a $50.00 late cancellation/no-show fee.    Thank you for choosing [facility] for your medical needs.  We appreciate the opportunity to care for you.  Please visit [place] at our website  to learn more about our practice.                     Sincerely,                                                             The Gastroenterology Division

## 2010-06-14 NOTE — Progress Notes (Signed)
Summary: phone/gg  Phone Note Call from Patient   Caller: Patient Summary of Call: Pt. got a ticket for not wearing a seat belt. Was told by attorney if she had a note for her doctor stating she has Crohn's disease she would not have to pay for it.  Pt states it's hard to wear the seatbelt and is asking for a note saying that Crohns is preventing her from wearing a seat belt. She asked Dr Christella Hartigan to write the letter but he said no.  (See last entry.) Please advise. Initial call taken by: Merrie Roof RN,  February 18, 2010 5:05 PM  Follow-up for Phone Call        Pt has appointment with you on the 11th.  Can she get the letter then? Follow-up by: Merrie Roof RN,  February 18, 2010 5:25 PM  Additional Follow-up for Phone Call Additional follow up Details #1::        Done. Additional Follow-up by: Zoila Shutter MD,  February 22, 2010 9:31 AM

## 2010-06-14 NOTE — Assessment & Plan Note (Signed)
Summary: ER/FU-/CFB   Vital Signs:  Patient profile:   60 year old female Height:      64.25 inches (163.19 cm) Weight:      108.0 pounds (49.09 kg) BMI:     18.46 Temp:     99.2 degrees F (37.33 degrees C) oral Pulse rate:   82 / minute BP sitting:   98 / 59  (left arm) Cuff size:   regular  Vitals Entered By: Cynda Familia Duncan Dull) (February 22, 2010 9:12 AM) CC: Er f/u, problems with left eye for about 1 year, appt next tues with Dr Dwain Sarna Is Patient Diabetic? No Pain Assessment Patient in pain? yes     Location: abdomen Intensity: 3 Type: aching/cramping Onset of pain  Chronic Nutritional Status BMI of < 19 = underweight  Have you ever been in a relationship where you felt threatened, hurt or afraid?No   Does patient need assistance? Functional Status Self care Ambulation Normal   Primary Care Provider:  Dr Coralee Pesa  CC:  Er f/u, problems with left eye for about 1 year, and appt next tues with Dr Dwain Sarna.  History of Present Illness: 60 year old pt. who comes in for follow up. I spoke with her yesterday about a letter saying she could not wear her safety belt secondary to pain and I have written that letter.   I also spoke with Dr. Dwain Sarna yesterday. He is planning to call Ms. Sansom  and get her scheduled for surgery now that the ileoscopy through ostomy has been done. This showed a stricture and the biopsies did not show any active disease.   She has continued to have pain. Her weight is down 11 pounds.  Ms. Jasmer also c/o a film over her L eye that she has had for over a year. She says that it is very hard to drive at night because of glare.   She was in the ED since she was last in with a GI bug treated with IVF, anti-emetics, and pain meds. That has completely resolved.  She also has had a problem with incontinence since her first surgery for her Crohn's which the surgeon had said was expected.  She is sleeping well with the addition of trazadone to  her celexa.  Preventive Screening-Counseling & Management  Alcohol-Tobacco     Smoking Status: current     Smoking Cessation Counseling: yes     Packs/Day: 1.0     Year Started: started at 34  Current Medications (verified): 1)  Promethazine Hcl 25 Mg Tabs (Promethazine Hcl) .... Take 1 Tablet By Mouth Every 6 Hours As Needed For Nausea 2)  Hydrocodone-Acetaminophen 5-500 Mg Tabs (Hydrocodone-Acetaminophen) .... Take 1 Tablet By Mouth Every 6 Hours As Needed For Pain 3)  Fosamax 70 Mg Tabs (Alendronate Sodium) .... Take One Tab Once A Week 4)  Trazodone Hcl 50 Mg Tabs (Trazodone Hcl) .Marland Kitchen.. 1-2 At Night As Needed For Sleep 5)  Citalopram Hydrobromide 40 Mg Tabs (Citalopram Hydrobromide) .... One A Day  Allergies: 1)  ! Codeine  Family History: Reviewed history from 01/31/2010 and no changes required. Crohn's disease, brother  Social History: Reviewed history from 01/31/2010 and no changes required. single, lives with 18 birds, 3 dogs and 2 cats has a very supportive younger brother who also has Crohn's Current Smoker Alcohol use-no  Packs/Day:  1.0  Review of Systems General:  Complains of weight loss; denies fatigue and weakness. CV:  Denies chest pain or discomfort and palpitations. Resp:  Denies cough and shortness of breath. GI:  Complains of abdominal pain; denies constipation, loss of appetite, nausea, and vomiting.  Physical Exam  General:  alert and underweight appearing.   Head:  normocephalic and atraumatic.   Eyes:  vision grossly intact. bilateral cataracts  Neck:  supple and no masses.   Breasts:  no masses.   Lungs:  normal respiratory effort and normal breath sounds.   Heart:  normal rate, regular rhythm, and no murmur.   Abdomen:  soft, colostomy bag, being held on by a bag, diffuse tenderness   Psych:  mood good since being told she was going to have surgery   Impression & Recommendations:  Problem # 1:  DEPRESSION, SITUATIONAL (ICD-309.0) Doing  well. Continue celexa and trazadone.  Problem # 2:  CATARACTS, BILATERAL (ICD-366.9) I will refer her to ophthalmology. I have asked her to avoid driving at night. These have probably occurred as early as they have because of chronic steroid use.  Problem # 3:  CROHN'S DISEASE (ICD-555.9) Thankfully patient is seeing Dr. Dwain Sarna next week and hopefully will be scheduled for surgery soon. We have talked about the importance of compliance with medications if needed after surgery. However the biopsies did not show active disease. I have asked her to try to eat healthily and increase protein before surgery to maximize nutritional status.  Problem # 4:  OSTEOPOROSIS (ICD-733.00) She is tolerating the fosamax without problem. Her updated medication list for this problem includes:    Fosamax 70 Mg Tabs (Alendronate sodium) .Marland Kitchen... Take one tab once a week  Problem # 5:  Preventive Health Care (ICD-V70.0) Flu shot given today.  Complete Medication List: 1)  Promethazine Hcl 25 Mg Tabs (Promethazine hcl) .... Take 1 tablet by mouth every 6 hours as needed for nausea 2)  Hydrocodone-acetaminophen 5-500 Mg Tabs (Hydrocodone-acetaminophen) .... Take 1 tablet by mouth every 6 hours as needed for pain 3)  Fosamax 70 Mg Tabs (Alendronate sodium) .... Take one tab once a week 4)  Trazodone Hcl 50 Mg Tabs (Trazodone hcl) .Marland Kitchen.. 1-2 at night as needed for sleep 5)  Citalopram Hydrobromide 40 Mg Tabs (Citalopram hydrobromide) .... One a day  Patient Instructions: 1)  Please schedule a follow-up appointment in 3 months. 2)  I am so glad you are going to get this surgery.   Prevention & Chronic Care Immunizations   Influenza vaccine: Not documented    Tetanus booster: 01/13/2009: Historical    Pneumococcal vaccine: Historical  (08/13/2009)  Colorectal Screening   Hemoccult: Not documented    Colonoscopy: Not documented  Other Screening   Pap smear: Not documented    Mammogram: ASSESSMENT:  Negative - BI-RADS 1^MM DIGITAL SCREENING  (11/23/2009)   Mammogram action/deferral: Ordered  (11/09/2009)   Smoking status: current  (02/22/2010)   Smoking cessation counseling: yes  (02/22/2010)  Lipids   Total Cholesterol: Not documented   LDL: Not documented   LDL Direct: Not documented   HDL: Not documented   Triglycerides: Not documented   Appended Document: ophthalmology referral//kg    Clinical Lists Changes  Orders: Added new Referral order of Ophthalmology Referral (Ophthalmology) - Signed

## 2010-06-14 NOTE — Letter (Signed)
Summary: [facility]   Imported By: Lester Ericson 03/18/2010 09:01:06  _____________________________________________________________________  External Attachment:    Type:   Image     Comment:   External Document

## 2010-06-14 NOTE — Assessment & Plan Note (Signed)
History of Present Illness Visit Type: Initial Consult Primary GI MD: Rob Bunting MD Primary Provider: Dr Coralee Pesa Requesting Provider: Moishe Spice Chief Complaint: Crohns History of Present Illness:     very pleasant 60 year old woman who was diagnosed with crohns disease in her 71s.  Her brother also had crohn's.    She has had several surgeries (4-5), one was complicated by abscess.  She has had at least a total colectomy, ileostomy.  She has no anal discharge, function.   She has had ileostomy since 1980s and  never been revised. Currently she is have ostomy problems, leakage at the site for 10 years.  5 years ago evluated by [employer], who recommended ostomy revision but she declined at that time.  She occasionally has cramps/nausea/joint pain/watery ostomy output. She will go on liquids, sometimes for 4-5 days.  then slowy resume regular diet for her (low residue, soft, no veggie diet).  she was on prednsione (given by her veternarian) that she took daily about a year ago.  The last was 10 months ago. Was off/on pred for many years "the only thing that helped."  Never on azathiaprine/31mp.  Never been on remicade (her brother is though).           Current Medications (verified): 1)  Promethazine Hcl 25 Mg Tabs (Promethazine Hcl) .... Take 1 Tablet By Mouth Every 6 Hours As Needed For Nausea 2)  Hydrocodone-Acetaminophen 5-500 Mg Tabs (Hydrocodone-Acetaminophen) .... Take 1 Tablet By Mouth Every 6 Hours As Needed For Pain 3)  Fosamax 70 Mg Tabs (Alendronate Sodium) .... Take One Tab Once A Week 4)  Trazodone Hcl 50 Mg Tabs (Trazodone Hcl) .Marland Kitchen.. 1-2 At Night As Needed For Sleep 5)  Citalopram Hydrobromide 40 Mg Tabs (Citalopram Hydrobromide) .... One A Day  Allergies (verified): 1)  ! Codeine  Past History:  Past Medical History: Crohn's disease, diagnosed 1980s status post multiple surgeries eventual total colectomy, ileostomy Anxiety Chronic  headaches Depression Pneumonia Occasional UTI  Past Surgical History: 1980-colectomy with end ileostomy small bowel resection(s)  Family History: Crohn's disease, brother  Social History: single, lives with 18 birds, 3 dogs and 2 cats has a very supportive younger brother who also has Crohn's Current Smoker Alcohol use-no   Review of Systems       Pertinent positive and negative review of systems were noted in the above HPI and GI specific review of systems.  All other review of systems was otherwise negative.   Vital Signs:  Patient profile:   60 year old female Height:      64.25 inches Weight:      109 pounds BMI:     18.63 BSA:     1.52 Pulse rate:   80 / minute Pulse rhythm:   regular BP sitting:   110 / 60  (left arm)  Vitals Entered By: Merri Ray CMA Duncan Dull) (January 31, 2010 10:16 AM)  Physical Exam  Additional Exam:  Constitutional: generally well appearing Psychiatric: alert and oriented times 3 Eyes: extraocular movements intact Mouth: oropharynx moist, no lesions Neck: supple, no lymphadenopathy Cardiovascular: heart regular rate and rythm Lungs: CTA bilaterally Abdomen: Ostomy site visualized, removed wafer:  the ostomy opening is indurated, quite tender to touch,  no indurated tissue, no suggestion of abscess, umbilicus is reddened, she has thick midline scarring. Mildly tender other than her ostomy itself which is a bit more tender. Extremities: no lower extremity edema bilaterally Skin: no lesions on visible extremities    Impression &  Recommendations:  Problem # 1:  history of Crohn's disease, painful ileostomy site I think the most important question on mind is whether she has active Crohn's disease. Her ostomy site is definitely reddened, this could be from active Crohn's disease. The ostomy weeks and may indeed need to be revised by think we should check to see if she has active disease first, get that under control if possible with  medicines prior to any revision of ileostomy.  She'll get basic set of labs including a CBC, metabolic profile, sedimentation rate, Prometheus TPMT testing ( to consider eventual immunomodulators treatments)   we will set her up with CT enterography as well. I may also want to proceed with ileostomy examination with a thin endoscope, biopsies.   Patient Instructions: 1)  You will get lab test(s) done today (cbc, cmet, esr, Prometheus TPMT testing). 2)  You will be scheduled for a CT (enterography) scan of abdomen and pelvis with IV and oral contrast. 3)  You will be scheduled to have an ileostomy examination (propofol, book as a flex sig without enemas however) at [facility]. 4)  A copy of this information will be sent to Dr. Dwain Sarna. 5)  The medication list was reviewed and reconciled.  All changed / newly prescribed medications were explained.  A complete medication list was provided to the patient / caregiver.  Appended Document:  Added to her intructions.Marland KitchenMarland KitchenMarland KitchenOne of your biggest health concerns is your smoking.  You should try your absolute best to stop.  If you need assistence, please contact your PCP or Smoking Cessation Class at [facility] [phone]) or [facility] Quit-Line (1-800-QUIT-NOW).       Allergies: 1)  ! Codeine   Patient Instructions: 1)  Smoking is TERRIBLE for Crohn's disease.  You should try your absolute best to stop.  If you need assistence, please contact your PCP or Smoking Cessation Class at [facility] [phone]) or [facility] Quit-Line (1-800-QUIT-NOW). 2)  The medication list was reviewed and reconciled.  All changed / newly prescribed medications were explained.  A complete medication list was provided to the patient / caregiver.    Appended Document: Orders Update/Labs    Clinical Lists Changes  Orders: Added new Test order of TLB-CBC Platelet - w/Differential (85025-CBCD) - Signed Added new Test order of TLB-CMP (Comprehensive Metabolic Pnl)  (80053-COMP) - Signed Added new Test order of TLB-Sedimentation Rate (ESR) (85652-ESR) - Signed Added new Referral order of CT Enteroscopy (CT Enteroscopy) - Signed      Appended Document: Orders Update/flex    Clinical Lists Changes  Orders: Added new Test order of ZFLEX (ZFL) - Signed

## 2010-06-14 NOTE — Consult Note (Signed)
[facility]   Imported By: Louretta Parma 03/16/2010 14:34:11  _____________________________________________________________________  External Attachment:    Type:   Image     Comment:   External Document

## 2010-06-14 NOTE — Progress Notes (Signed)
Summary: med refill/gp  Phone Note Refill Request Message from:  Fax from Pharmacy on December 15, 2009 10:21 AM  Refills Requested: Medication #1:  HYDROCODONE-ACETAMINOPHEN 5-500 MG TABS Take 1 tablet by mouth every 6 hours as needed for pain   Last Refilled: 11/09/2009 Last appt. June 28 w/labs.   Method Requested: Telephone to Pharmacy Initial call taken by: Chinita Pester RN,  December 15, 2009 10:22 AM  Follow-up for Phone Call        [facility] Follow-up by: Chinita Pester RN,  December 15, 2009 10:30 AM    Prescriptions: HYDROCODONE-ACETAMINOPHEN 5-500 MG TABS (HYDROCODONE-ACETAMINOPHEN) Take 1 tablet by mouth every 6 hours as needed for pain  #60 x 1   Entered and Authorized by:   Zoila Shutter MD   Signed by:   Zoila Shutter MD on 12/15/2009   Method used:   Telephoned to ...       [facility]  [phone]* (retail)       [address] [place], [place]  [address]       Ph: [phone] or [phone]       Fax: [phone]   RxID:   [id number]

## 2010-06-14 NOTE — Assessment & Plan Note (Signed)
Summary: new pt to clinic/cfb   Vital Signs:  Patient profile:   60 year old female Height:      64.25 inches (163.19 cm) Weight:      118.9 pounds (54.05 kg) BMI:     20.32 Temp:     97.5 degrees F (36.39 degrees C) oral Pulse rate:   73 / minute BP sitting:   103 / 63  (right arm) Cuff size:   regular  Vitals Entered By: Cynda Familia Duncan Dull) (November 09, 2009 9:50 AM) CC: new to clinic, HFU-pt c/o knee and pain Is Patient Diabetic? No Pain Assessment Patient in pain? yes     Location: stoma Intensity: 4 Type: stinging Onset of pain  Chronic for 30+ years Nutritional Status BMI of 19 -24 = normal  Have you ever been in a relationship where you felt threatened, hurt or afraid?No   Does patient need assistance? Functional Status Self care Ambulation Normal   CC:  new to clinic and HFU-pt c/o knee and pain.  History of Present Illness: 60 year old patient who was admitted to my B service 4/24-4/27/11 with abdominal pain, and nausea and vomiting secondary to irritation surrounding her ostomy site and also longstanding pain as she has a very stenotic iliostomy that she was told she needed surgery for 6 years ago.  When Ms. Blomberg came into the hospital we were unaware of the past history and thought that this was a more acute problem. She had not been changing her ostomy bags as directed (q week), but had been making them last up to 3 weeks because of the cost and we thought this may have been the main cause of her problem.  However on obtaining old records, on 11/11/04 she was seen by GI who documented that she had seen a local surgeon, Dr. Jamey Ripa, who recommended a laparotomy with iliostomy revision at that time. This was because she had a very stenotic iliostomy orifice. At that time she wanted to wait on the surgery and even today when I bring it up she wanted to put it off. She had been using "pred"-prednisone that she got from the vet clinic where she worked up till several  months ago to help the pain around her site caused by the stenosis and almost all foods that go through the site.  We discharged her from the hospital with #60 vicodin and she takes 1 a day so she can eat some dinner. She denies any recent prednisone use. She has not had any symptoms to suggest acute Crohn's flare.  She also has a history of osteoporosis but has not had a bone density in some time. She went through menopause at age 32. She lives with 18 birds, 3 dogs and 2 cats which are her family and that is why she is hesitant to take care of herself.  Depression History:      The patient denies a depressed mood most of the day and a diminished interest in her usual daily activities.         Preventive Screening-Counseling & Management  Alcohol-Tobacco     Smoking Status: current     Smoking Cessation Counseling: yes     Packs/Day: 2.0     Year Started: started at 51  Allergies (verified): No Known Drug Allergies  Past History:  Past Surgical History: 1980-colectomy with end ileostomy  Social History: single, lives with 18 birds, 3 dogs and 2 cats has a very supportive younger brother who also  has Crohn's Current Smoker Alcohol use-no Packs/Day:  2.0 Smoking Status:  current  Review of Systems       she does have weight loss because of decreased by mouth as it is painful for anything to go through her iliostomy CV:  Denies chest pain or discomfort and palpitations. Resp:  Denies cough and shortness of breath. GI:  as per HPI.  Physical Exam  General:  alert and well-developed.   Head:  normocephalic and atraumatic.   Neck:  supple and no masses.   Lungs:  normal respiratory effort and normal breath sounds.   Heart:  normal rate and regular rhythm.   Abdomen:  soft and normal bowel sounds.  iliostomy bag with normal appearing liquid stool, she did not take off the bandage to see the surrounding skin, the mucosa is pulled in some. Extremities:  no  edema   Impression & Recommendations:  Problem # 1:  ABDOMINAL PAIN (ICD-789.00) This is almost certainly secondary to the stenosis of the iliostomy which can only have gotten worse over the last 6 years. After talking to Ms. Tarte for some time she understood the importance of proceeding with this now. She is scheduled to see Dr. Jamey Ripa on July 11/11. I will check a sed rate to see if there is any evidence of active disease, but I do not think that there is. Orders: Surgical Referral (Surgery)  Problem # 2:  OSTEOPOROSIS (ICD-733.00) We have scheduled a bone density and then I have spoken with her about starting her on Fosamax. Orders: Dexa scan (Dexa scan)  Problem # 3:  CROHN'S DISEASE (ICD-555.9) See #1, as well I have given her #60 more vicodin for the pain so she can eat at least one meal a day. Orders: T-Comprehensive Metabolic Panel ([id number]) T-CBC w/Diff ([id number])  Problem # 4:  DEPRESSION, HX OF (ICD-V11.8) No current problem with this. Will follow.  Problem # 5:  Preventive Health Care (ICD-V70.0) Mammogram and bone density scheduled for 11/22/09. She had a Tdap in fall of 2010. She had a pneumovax when we had her in the hospital in 4/11. We will follow up on a pap smear and fasting lipids in future visits. She is to follow up in 6 weeks.  Complete Medication List: 1)  Promethazine Hcl 25 Mg Tabs (Promethazine hcl) .... Take 1 tablet by mouth every 6 hours as needed for nausea 2)  Hydrocodone-acetaminophen 5-500 Mg Tabs (Hydrocodone-acetaminophen) .... Take 1 tablet by mouth every 6 hours as needed for pain  Other Orders: Mammogram (Screening) (Mammo)  Patient Instructions: 1)  Please schedule a follow-up appointment in 6-8 weeks. 2)  I have refilled your vicodin for your abdominal pain. 3)  You are scheduled to see Dr. Jamey Ripa on July 12. 4)  TAKE CARE OF YOURSELF!!! Prescriptions: HYDROCODONE-ACETAMINOPHEN 5-500 MG TABS (HYDROCODONE-ACETAMINOPHEN) Take 1  tablet by mouth every 6 hours as needed for pain  #60 x 0   Entered and Authorized by:   Zoila Shutter MD   Signed by:   Zoila Shutter MD on 11/09/2009   Method used:   Print then Give to Patient   RxID:   [id number]  Process Orders Check Orders Results:     [facility]: Check successful Tests Sent for requisitioning (November 10, 2009 1:18 PM):     11/09/2009: [facility] -- T-Comprehensive Metabolic Panel [[id number]] (signed)     11/09/2009: [facility] -- [facility] w/Diff [[id number]] (signed)    Prevention & Chronic  Care Immunizations   Influenza vaccine: Not documented    Tetanus booster: 01/13/2009: Historical    Pneumococcal vaccine: Historical  (08/13/2009)  Colorectal Screening   Hemoccult: Not documented    Colonoscopy: Not documented  Other Screening   Pap smear: Not documented    Mammogram: Not documented   Mammogram action/deferral: Ordered  (11/09/2009)   Smoking status: current  (11/09/2009)   Smoking cessation counseling: yes  (11/09/2009)  Lipids   Total Cholesterol: Not documented   LDL: Not documented   LDL Direct: Not documented   HDL: Not documented   Triglycerides: Not documented   Nursing Instructions: Schedule screening mammogram (see order)    Immunization History:  Pneumovax Immunization History:    Pneumovax:  historical (08/13/2009)  Tetanus/Td Immunization History:    Tetanus/Td:  historical (01/13/2009)

## 2010-06-15 LAB — CONVERTED CEMR LAB
ALT: 14 units/L (ref 0–35)
AST: 19 units/L (ref 0–37)
Albumin: 4.1 g/dL (ref 3.5–5.2)
Alkaline Phosphatase: 96 units/L (ref 39–117)
Basophils Absolute: 0.1 10*3/uL (ref 0.0–0.1)
Basophils Relative: 1 % (ref 0–1)
Calcium: 9.4 mg/dL (ref 8.4–10.5)
Chloride: 100 meq/L (ref 96–112)
Hemoglobin: 12.9 g/dL (ref 12.0–15.0)
Lymphocytes Relative: 32 % (ref 12–46)
MCHC: 31.9 g/dL (ref 30.0–36.0)
Monocytes Absolute: 0.5 10*3/uL (ref 0.1–1.0)
Neutro Abs: 2.9 10*3/uL (ref 1.7–7.7)
Platelets: 200 10*3/uL (ref 150–400)
Potassium: 4.3 meq/L (ref 3.5–5.3)
RDW: 12.9 % (ref 11.5–15.5)
Sed Rate: 6 mm/hr (ref 0–22)
Sodium: 134 meq/L — ABNORMAL LOW (ref 135–145)
Total Protein: 7 g/dL (ref 6.0–8.3)

## 2010-06-16 NOTE — Assessment & Plan Note (Signed)
Summary: Cardiology Nuclear Testing  Nuclear Med Background Indications for Stress Test: Evaluation for Ischemia, Post Hospital  Indications Comments: 04/13/10 Hosp. for ileostomy revision>>04/15/10 Surg. >>Post OP Day #2 >>Polymorphic VT with Assoc. CP & SOB  History: Echo  History Comments: 04/15/10 EF = 50-55%  Symptoms: Chest Pain, Chest Pressure, Fatigue, Fatigue with Exertion, Palpitations, SOB    Nuclear Pre-Procedure Cardiac Risk Factors: Smoker Caffeine/Decaff Intake: none NPO After: 8:00 PM Lungs: Clear IV 0.9% NS with Angio Cath: 22g     IV Site: R Forearm IV Started by: Cathlyn Parsons, RN Chest Size (in) 34     Cup Size B     Height (in): 64.25 Weight (lb): 97 BMI: 16.58 Tech Comments: Patient not taking Metoprolol at present.  Nuclear Med Study 1 or 2 day study:  1 day     Stress Test Type:  Eugenie Birks Reading MD:  Olga Millers, MD     Referring MD:  Lovina Reach Resting Radionuclide:  Technetium 64m Tetrofosmin     Resting Radionuclide Dose:  10.7 mCi  Stress Radionuclide:  Technetium 24m Tetrofosmin     Stress Radionuclide Dose:  33 mCi   Stress Protocol      Max HR:  115 bpm     Predicted Max HR:  162 bpm  Max Systolic BP: 123 mm Hg     Percent Max HR:  70.99 %Rate Pressure Product:  98119  Lexiscan: 0.4 mg   Stress Test Technologist:  Irean Hong,  RN     Nuclear Technologist:  Doyne Keel, CNMT  Rest Procedure  Myocardial perfusion imaging was performed at rest 45 minutes following the intravenous administration of Technetium 50m Tetrofosmin.  Stress Procedure  The patient received IV Lexiscan 0.4 mg over 15-seconds.  Technetium 82m Tetrofosmin injected at 30-seconds.  The EKG was nondiagnostic due to baseline T wave changes, which was reviewed by Dr. Algis Downs. Bensimhon.  Quantitative spect images were obtained after a 45 minute delay.  QPS Raw Data Images:  Acquisition technically good; normal left ventricular size. Stress Images:  Normal homogeneous  uptake in all areas of the myocardium. Rest Images:  Normal homogeneous uptake in all areas of the myocardium. Subtraction (SDS):  No evidence of ischemia. Transient Ischemic Dilatation:  1.13  (Normal <1.22)  Lung/Heart Ratio:  .34  (Normal <0.45)  Quantitative Gated Spect Images QGS EDV:  70 ml QGS ESV:  29 ml QGS EF:  59 % QGS cine images:  Normal wall motion.   Overall Impression  Exercise Capacity: Lexiscan with no exercise. BP Response: Normal blood pressure response. Clinical Symptoms: There is chest pain ECG Impression: No significant ST segment change suggestive of ischemia; patient with anterior Twave inversion on baseline ECG with prolonged QT. Overall Impression: Normal lexiscan nuclear study with no ischemia or infarction.    Appended Document: Cardiology Nuclear Testing Normal stress nuclear study. Still needs f/u for prolonged QT on IKG Pull chart for review with EP.  Appended Document: Cardiology Nuclear Testing LMTCB. PT HAS APPT WITH SCOTT Lawler TODAY AT 11:00 AM./CY  Appended Document: Cardiology Nuclear Testing SEE PHONE NOTE./CY

## 2010-06-16 NOTE — Progress Notes (Signed)
Summary: to ED/ hla  Phone Note Call from Patient   Caller: Patient Summary of Call: pt calls and states she has N&V, weakness, has been in ED 3x in 3 days, she states she feels horrible, she is advised to go to ED now, do not drive, either have someone bring her or call 911, she is agreeable. Initial call taken by: Marin Roberts RN,  May 27, 2010 5:02 PM  Follow-up for Phone Call        Noted. Follow-up by: Zoila Shutter MD,  May 30, 2010 1:16 PM

## 2010-06-16 NOTE — Consult Note (Signed)
Summary: DR.WAKEFIELD  DR.WAKEFIELD   Imported By: Margie Billet 05/25/2010 11:29:57  _____________________________________________________________________  External Attachment:    Type:   Image     Comment:   External Document

## 2010-06-16 NOTE — Progress Notes (Signed)
Summary: TPN order//kg  Phone Note Other Incoming Call back at [phone]   Caller: [facility] Summary of Call: Call from [facility] with [facility] who stated that when they contacted the pt regarding getting her TPN, the pt stated she had numerous birds and dogs and in singlewide trailer and didnt want them to come to her home.  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  [facility] then called our office to make [place] aware that because this pt works, she is not considered homebound and they may not be able to see her at at all in the home.  They will have to check with the supervisor tomorrow morning.  I attempted to call [facility] to see if that was an option, but they had already closed for the day.  As the office is closed tomorrow and Monday, Debbie with [facility] was given Dr Sandy Salaam pager number to keep her informed as to what the next step is.  Pt is currently scheduled for a picc line tomorrow at 2pm here at [facility].  Eunice Blase stated that if the supervisor does agree to letting pt be seen, it now may be early next week before they can get there and has the number to reschedule pt's picc line if needed.  They are short staffed with the Christmas holiday.  Debbie willl keep in touch with pt over the weekend.  I also contacted pt and made her aware that  Eunice Blase will call her back tomorrow.  Of note the pt is still responsible to 20% which is estimated to be about $336 a week.  [facility] also wanted to know how long the pt will need the TPN and I did not have the answer to that.  [facility] will f/u with Dr Coralee Pesa and patient tomorrow.Cynda Familia [facility])  May 05, 2010 5:34 PM   Follow-up for Phone Call        I spoke with Eunice Blase at [facility] and patient several times today. For now PICC line is cancelled for today and reschedulled for 05/10/10 at 2pm. Patient will get TPN care done at a friend or family members house. Eunice Blase is checking to  see is they can help with the cost of nursing care for patient. Follow-up by: Zoila Shutter MD,  May 06, 2010 1:32 PM

## 2010-06-16 NOTE — Progress Notes (Signed)
Summary: phone/gg  Phone Note From Other Clinic   Summary of Call: Call from [employer], nurse with [facility]  [phone] The PICC flushes okay but they can't get blood return.  They want an order for cathflo to flush in home. they are checking on insurance coverage.  She may need to be set up in for outpatient care.  Will call tomorrow. Initial call taken by: Merrie Roof RN,  June 02, 2010 5:06 PM  Follow-up for Phone Call        Talked with [facility] and in home cath flo is not paid for so pt will need to go to "out pt clinic" and we have to set this up. We can set this up at [facility] as soon as possible so they can draw blood. Follow-up by: Merrie Roof RN,  June 03, 2010 3:48 PM  Additional Follow-up for Phone Call Additional follow up Details #1::        IR called to set up time for pt to come in and have PICC line evaluated.  ex [phone] I/Radiology is closed and scheduler is gone for the day. They will call pt  on Monday and will work pt in. Pt is not to eat monday AM. Message left for pt.    Additional Follow-up for Phone Call Additional follow up Details #2::    I discussed situation by phone with IR, who placed the PICC line, and they advised that, so long as the PICC is usable, patient come in Monday and they will assess and manage the PICC problem (see RN note above). Follow-up by: Margarito Liner MD,  June 03, 2010 6:26 PM  Additional Follow-up for Phone Call Additional follow up Details #3:: Details for Additional Follow-up Action Taken: Thank you. Additional Follow-up by: Zoila Shutter MD,  June 05, 2010 12:36 PM   Appended Document: phone/gg IR at [facility] unable to evaluate PICC today so pt sent to [facility] IR for evaluation. Order sent to evaluate and change if necessary.  Dr Reche Dixon signed order.  Order faxed to IR @ [facility] Pt called and will go to [facility] now for evaluation.

## 2010-06-16 NOTE — Miscellaneous (Addendum)
  Pt called now and said she was dehydrated and feels weak and is on her way to the ED.

## 2010-06-16 NOTE — Assessment & Plan Note (Signed)
Summary: EST-3 MONTH RECHECK/CH   Vital Signs:  Patient profile:   60 year old female Height:      64.25 inches (163.19 cm) Weight:      98.8 pounds (44.91 kg) BMI:     16.89 Temp:     97.2 degrees F (36.22 degrees C) oral Pulse rate:   64 / minute BP sitting:   107 / 63  (right arm) Cuff size:   small  Vitals Entered By: Cynda Familia Duncan Dull) (May 31, 2010 2:09 PM) CC: f/u-c/o feeling weak, seen recently in ER Is Patient Diabetic? No Pain Assessment Patient in pain? no      Nutritional Status BMI of < 19 = underweight  Have you ever been in a relationship where you felt threatened, hurt or afraid?Unable to ask   Does patient need assistance? Functional Status Self care Ambulation Normal   Primary Care Provider:  Dr Coralee Pesa  CC:  f/u-c/o feeling weak and seen recently in ER.  History of Present Illness: 60 year old well known to me who I last saw in consultation in the ED on 05/25/10. At that time she was havining nausea and vomiting and was having abdominal pain. She wasn't sure if this was a GI bug or just what happens at times but she refused admission. She was given IVF and sent out. She went to the ED on 1/12 and 1/13 as well but was sent out. She comes in today feeling better, finally, but she has lost all  of the weight that she had gained.  As well home health has called and said that she has been getting her care for TPN in a car and cannot.  Today she c/o of dysuria,  as she has not taken much in. She denies fever, chills, sweats, or CVA tenderness  She does have good news in that her abdomen is starting to feel better and she thinks that the surgery has made a difference! As well the colostomy bag is fitting better.  She also has what she thinks is a tooth abscess, but does not have the money to take care of it.     Depression History:      The patient denies a depressed mood most of the day and a diminished interest in her usual daily activities.           Preventive Screening-Counseling & Management  Alcohol-Tobacco     Smoking Status: current     Smoking Cessation Counseling: yes     Packs/Day: 1.0     Year Started: started at 60  Current Medications (verified): 1)  Promethazine Hcl 25 Mg Tabs (Promethazine Hcl) .... Take 1 Tablet By Mouth Every 6 Hours As Needed For Nausea 2)  Fosamax 70 Mg Tabs (Alendronate Sodium) .... Take One Tab Once A Week 3)  Metoprolol Tartrate 25 Mg Tabs (Metoprolol Tartrate) .... One Half A Tab Every 8 Hours 4)  Oxycodone-Acetaminophen 5-325 Mg Tabs (Oxycodone-Acetaminophen) .Marland Kitchen.. 1-2 By Mouth Q 4 Hours As Needed-Prescibed By Gi Surgery 5)  Bupropion Hcl 150 Mg Xr12h-Tab (Bupropion Hcl) .Marland Kitchen.. 1 A Day For 1 Week, Then Start Taking Two Times A Day  Allergies: 1)  ! Codeine  Past History:  Past Medical History: Reviewed history from 05/04/2010 and no changes required. Crohn's disease, diagnosed 1980s status post multiple surgeries eventual total colectomy, ileostomy Anxiety Chronic headaches Depression Pneumonia Occasional UTI s/p surgery 04/13/10  Family History: Reviewed history from 01/31/2010 and no changes required. Crohn's disease, brother  Social History: Reviewed history from 01/31/2010 and no changes required. single, lives with 18 birds, 3 dogs and 2 cats has a very supportive younger brother who also has Crohn's Current Smoker Alcohol use-no   Review of Systems General:  Complains of fatigue, loss of appetite, and weight loss; denies chills, fever, and sweats. CV:  Denies chest pain or discomfort and palpitations. Resp:  Denies cough and shortness of breath. GI:  Complains of abdominal pain, nausea, and vomiting. GU:  Complains of dysuria; decrease in urine output. Psych:  Complains of depression.  Physical Exam  General:  Frail appearing in NAD Head:  normocephalic and atraumatic.   Eyes:  vision grossly intact.   Mouth:  poor dentition.  mandible tender to palpate  along right side, erythema along lower right gum line, mostly thick white sputum surronding that area Neck:  no masses.  no lymphadenopathy Lungs:  normal respiratory effort and normal breath sounds.   Heart:  normal rate, regular rhythm, and no murmur.   Abdomen:  colostomy bag in place, less tenderness surrounding itsoft and normal bowel sounds.   Extremities:  no edema Psych:  Oriented X3.  still somewhat anxious and depressed but mood is better than it has been   Impression & Recommendations:  Problem # 1:  MALNUTRITION (ICD-263.9) I think we are starting to make some headway with the TPN as her albumin is normalizing. Unfortunately she has had nausea and vomiting for several days and lost the few pounds that she had gained. She now feels that she is around the corner from that and taking full liquids. Continue TPN. I have spoken to home health several times about where she is trying to receive care for her line and TPN (car,etc.) and spoken to patient about the importance of complying with rules of home health so that she can continue to receive TPN.  Problem # 2:  ABSCESS, TOOTH (ICD-522.5) Prescription for Augmentin 875mg  two times a day for 10 days given. Orders: T-CBC w/Diff ([id number])  Problem # 3:  DYSURIA (ICD-788.1)  Urine dip was done that did not show UTI but just dehydration, sgr>1.030. Will culture urine to make sure that there is not a UTI that didn't show up on U/A and have talked to her about importance of increased by mouth intake.  Problem # 4:  TOBACCO ABUSE (ICD-305.1) She has been smking less but that is because she has been acutely ill. She does not feel a difference from the wellbutrin yet but she is tolerating it.  Problem # 5:  DEPRESSION, SITUATIONAL (ICD-309.0) Patient does seem less depressed today. It may be that the medication is kicking in, or it may be that she is starting to see benefit from the sargery. I will see her back in a few weeks.  Greater  than 50 minutes spent with >50% spent face to face with counseling and coordination of care discussing patient's care, with surgery, home health, TPN nurse, ED. As well I have spoken to her at length about the importance of adherence to her schedule with home health and meeting them at a HOUSE, etc.  Complete Medication List: 1)  Promethazine Hcl 25 Mg Tabs (Promethazine hcl) .... Take 1 tablet by mouth every 6 hours as needed for nausea 2)  Fosamax 70 Mg Tabs (Alendronate sodium) .... Take one tab once a week 3)  Metoprolol Tartrate 25 Mg Tabs (Metoprolol tartrate) .... One half a tab every 8 hours 4)  Oxycodone-acetaminophen 5-325 Mg Tabs (Oxycodone-acetaminophen) .Marland KitchenMarland KitchenMarland Kitchen  1-2 by mouth q 4 hours as needed-prescibed by gi surgery 5)  Bupropion Hcl 150 Mg Xr12h-tab (Bupropion hcl) .Marland Kitchen.. 1 a day for 1 week, then start taking two times a day 6)  Amoxicillin-pot Clavulanate 875-125 Mg Tabs (Amoxicillin-pot clavulanate) .... Take 1 two times a day  Other Orders: T-Urinalysis Dipstick only ([id number]) T-Culture, Urine ([id number])  Patient Instructions: 1)  Please schedule a follow-up appointment in 2 weeks. Purnell Shoemaker will call you with an appointment time. 2)  I will call in Augmentin for the tooth infection. 3)  I am so glad that your stomach is feeling better. 4)  Eat lots of good food!!! Prescriptions: AMOXICILLIN-POT CLAVULANATE 875-125 MG TABS (AMOXICILLIN-POT CLAVULANATE) take 1 two times a day  #20 x 1   Entered and Authorized by:   Zoila Shutter MD   Signed by:   Zoila Shutter MD on 06/01/2010   Method used:   Electronically to        [facility]  [address] [id number] (retail)       [address]       [place], [place]  [address]       Ph: [phone] or [phone]       Fax: [phone]   RxID:   [id number]    Orders Added: 1)  Est. Patient Level V [99215] 2)  T-Urinalysis Dipstick only [[id number]] 3)  T-Culture, Urine [[id number]] 4)  T-CBC w/Diff [[id number]] 5)  Est.  Patient Level V [99215] 6)  Est. Patient Level V [16010]   Process Orders Check Orders Results:     [facility]: Check successful Tests Sent for requisitioning (June 01, 2010 3:35 PM):     05/31/2010: [facility] -- T-Culture, Urine [[id number]] (signed)     05/31/2010: [facility] -- T-CBC w/Diff [[id number]] (signed)   Blood from drawn from left upper arm double lumen PICC for labs without difficulty; per protocol. Flushed with NS and Heparin per protocol.  Chinita Pester RN  May 31, 2010 3:26 PM   Prevention & Chronic Care Immunizations   Influenza vaccine: Historical  (02/22/2010)    Tetanus booster: 01/13/2009: Historical    Pneumococcal vaccine: Historical  (08/13/2009)  Colorectal Screening   Hemoccult: Not documented    Colonoscopy: Not documented  Other Screening   Pap smear: Not documented    Mammogram: ASSESSMENT: Negative - BI-RADS 1^MM DIGITAL SCREENING  (11/23/2009)   Mammogram action/deferral: Ordered  (11/09/2009)   Smoking status: current  (05/31/2010)   Smoking cessation counseling: yes  (05/31/2010)  Lipids   Total Cholesterol: Not documented   LDL: Not documented   LDL Direct: Not documented   HDL: Not documented   Triglycerides: Not documented  Laboratory Results   Urine Tests  Date/Time Received: Cynda Familia [facility])  May 31, 2010 5:15 PM  Date/Time Reported: Cynda Familia [facility])  May 31, 2010 5:15 PM   Routine Urinalysis   Color: yellow Appearance: Clear Glucose: negative   (Normal Range: Negative) Bilirubin: small   (Normal Range: Negative) Ketone: trace (5)   (Normal Range: Negative) Spec. Gravity: >=1.030   (Normal Range: 1.003-1.035) Blood: negative   (Normal Range: Negative) pH: 5.0   (Normal Range: 5.0-8.0) Protein: 30   (Normal Range: Negative) Urobilinogen: negative   (Normal Range: 0-1) Nitrite: negative   (Normal Range: Negative) Leukocyte  Esterace: negative   (Normal Range: Negative)

## 2010-06-16 NOTE — Progress Notes (Signed)
Summary: pt rtn call  Phone Note Call from Patient Call back at Home Phone [phone]   Caller: Patient Reason for Call: Talk to Nurse, Talk to Doctor Summary of Call: pt rtn call to christine Initial call taken by: Omer Jack,  April 29, 2010 10:04 AM  Follow-up for Phone Call        Phone Call Completed PT AWARE OF MYOVIEW RESULTS AND ALSO THE NEED TO SEE EP  WILL FORWARD  TO KELLY  TO GET PT AN APPT SCHEDULED  ASAP . Follow-up by: Scherrie Bateman, LPN,  April 29, 2010 12:43 PM

## 2010-06-16 NOTE — Progress Notes (Signed)
Summary: picc line and TPN order//kg  Phone Note Other Incoming   Summary of Call: Received request from Dr Weldon Inches office to have picc line placed in pt for nutrition.  Short Stay 1st avail appt is Fri 12/13 at 2:30pm with pt arriving by 2pm, she will see Dr Coralee Pesa tomorrow at 1:15pm.  Order for picc line to be faxed to Short Stay and order for TPN to be faxed to [facility] [phone] atten: Pharmacy along with her demographic, insurance, office notes from Dr Jamey Ripa.  Last ov note requested from [facility] and will fax requested information tomorrow am.Kaye Collene Schlichter Duncan Dull)  May 03, 2010 4:59 PM   Follow-up for Phone Call        Order faxed to IR for picc line and order faxed to [facility] for TPN (pt to be covered by 80%). Last ov note from [facility] received and sent to be scanned.  phone call complete. Follow-up by: Cynda Familia Duncan Dull),  May 04, 2010 4:03 PM

## 2010-06-16 NOTE — Miscellaneous (Signed)
  Clinical Lists Changes  Observations: Added new observation of NUCLEAR NOS: Exercise Capacity: Lexiscan with no exercise. BP Response: Normal blood pressure response. Clinical Symptoms: There is chest pain ECG Impression: No significant ST segment change suggestive of ischemia; patient with anterior Twave inversion on baseline ECG with prolonged QT. Overall Impression: Normal lexiscan nuclear study with no ischemia or infarction.   (04/26/2010 8:22)      Nuclear Study  Procedure date:  04/26/2010  Findings:      Exercise Capacity: Lexiscan with no exercise. BP Response: Normal blood pressure response. Clinical Symptoms: There is chest pain ECG Impression: No significant ST segment change suggestive of ischemia; patient with anterior Twave inversion on baseline ECG with prolonged QT. Overall Impression: Normal lexiscan nuclear study with no ischemia or infarction.

## 2010-06-16 NOTE — Miscellaneous (Signed)
Summary: Dichrarged from ED  INTERNAL MEDICINE ADMISSION HISTORY AND PHYSICAL Attending Dr Coralee Pesa Acting Intern Shanda Bumps [phone] R2 Dr Eben Burow [phone]  PCP: Dr. Coralee Pesa  CC: Abdominal pain, nausea and vomiting  HPI:  Misty [name] is a 59yo white woman with a history of Crohn's disease s/p total colectomy and multiple medical problems who presents via ambulance with nausea, vomiting and abdominal pain that started at 6pm yesterday.  The patient reports an initial premonition of oncoming illness and foul-tasting burping followed by vomiting every hour since last night.  The vomitus is nonbloody.  Ileostomy output had increased but is beginning to slow down again. The abdominal pain is diffuse and moderate to severe, with focus at the LUQ. The patient normally takes Phenergan at home but ran out of it yesterday.  She also takes Percocet and fentanyl patches; she is out of both of these as well (she vomited her last oxycodone tablet last night). She is on TPN 24 hours a day and tolerating that without problems, and believes she has been gaining weight on it. No fever, chills, myalgias, viral symptoms, sore throat, headache, vision changes, edema, abdominal bloating or distension or history of sick contacts. She was last seen by Dr. Dwain Sarna last week.    ALLERGIES:  CODEINE - nausea/vomiting PAST MEDICAL HISTORY: Crohn's disease, diagnosed 1980s status post multiple surgeries eventual total colectomy, ileostomy. Most      recent operation 04/13/10, lysis of adhesions, ileostomy revision. Anxiety Chronic headaches Depression Pneumonia Occasional UTI Osteoporosis Cataracts, bilateral Hysterectomy  MEDICATIONS: 1)  Promethazine Hcl 25 Mg Tabs Take 1 Tablet By Mouth Every 6 Hours As Needed For Nausea 2)  Fosamax 70 Mg Tabs (Alendronate Sodium) .... Take One Tab Once A Week 3)  Metoprolol Tartrate 25 Mg Tabs (Metoprolol Tartrate) .... One Half A Tab Every 8 Hours 4)  Fentanyl 50 Mcg/hr Pt72  (Fentanyl) .... Change Every 3 Days 5)  Oxycodone-Acetaminophen 5-325 Mg Tabs 1-2  Q 4 Hours as needed -Prescibed By Gi Surgery 6)  Bupropion Hcl 150 Mg Xr12h-Tab ... 1 A Day For 1 Week, Then Start Taking Two Times A Day  SOCIAL HISTORY: single, lives with 18 birds, 3 dogs and 2 cats has a very supportive younger brother who also has Crohn's Current Smoker Alcohol use-no    FAMILY HISTORY Crohn's disease, brother   ROS: As per HPI  VITALS: T:97.4  P: 52  BP:120/65  R:22  O2SAT: 97% ON: room air PHYSICAL EXAM: Gen: AOx3 but sleepy, in mild distress secondary to discomfort, lying in bed with lights out. Thin. Eyes: PERRL, EOMI ENT: Mucous membranes mildly dry, No erythema noted in posterior pharynx Neck: No JVD, No LAD Chest: CTAB with  good respiratory effort CVS: regular rhythmic rate, NO M/R/G, S1 S2 normal Abdo: soft,ND, BS+, No hepatosplenomegaly. Diffuse moderate tenderness with some guarding all 4 quadrants EXT: No edema noted Neuro: Nonfocal Skin: no rashes noted.   LABS: Sodium (NA)                              137               135-145          mEq/L  Potassium (K)                            3.5  3.5-5.1          mEq/L  Chloride                                 108               96-112           mEq/L  CO2                                      20                19-32            mEq/L  Glucose                                  179        h      70-99            mg/dL  BUN                                      10                6-23             mg/dL  Creatinine                               0.73              0.4-1.2          mg/dL  GFR, Est Non African American            >60               >60              mL/min  GFR, Est African American                >60               >60              mL/min    Oversized comment, see footnote  1  Calcium                                  9.3               8.4-10.5         mg/dL   WBC                                       7.1               4.0-10.5         K/uL  RBC                                      4.34  3.87-5.11        MIL/uL  Hemoglobin (HGB)                         13.1              12.0-15.0        g/dL  Hematocrit (HCT)                         39.6              36.0-46.0        %  MCV                                      91.2              78.0-100.0       fL  MCH -                                    30.2              26.0-34.0        pg  MCHC                                     33.1              30.0-36.0        g/dL  RDW                                      13.0              11.5-15.5        %  Platelet Count (PLT)                     230               150-400          K/uL  Neutrophils, %                           80         h      43-77            %  Lymphocytes, %                           11         l      12-46            %  Monocytes, %                             7                 3-12             %  Eosinophils, %  1                 0-5              %  Basophils, %                             1                 0-1              %  Neutrophils, Absolute                    5.6               1.7-7.7          K/uL  Lymphocytes, Absolute                    0.8               0.7-4.0          K/uL  Monocytes, Absolute                      0.5               0.1-1.0          K/uL  Eosinophils, Absolute                    0.1               0.0-0.7          K/uL  Basophils, Absolute                      0.1               0.0-0.1          K/uL   Color, Urine                             YELLOW            YELLOW  Appearance                               CLEAR             CLEAR  Specific Gravity                         1.022             1.005-1.030  pH                                       5.5               5.0-8.0  Urine Glucose                            100        a      NEG              mg/dL  Bilirubin  NEGATIVE          NEG  Ketones                                   NEGATIVE          NEG              mg/dL  Blood                                    NEGATIVE          NEG  Protein                                  NEGATIVE          NEG              mg/dL  Urobilinogen                             0.2               0.0-1.0          mg/dL  Nitrite                                  NEGATIVE          NEG  Leukocytes                               NEGATIVE          NEG  IMAGING: Abdominal XR: IMPRESSION: No acute findings.  ASSESSMENT AND PLAN: (1) Abdominal pain, nausea and vomiting: The patient presents with diffuse abdominal pain and severe nausea and vomiting.  This may be due to enteric viral infection; however, given her extensive surgical history additional precautions for intraabdominal bacterial infection, ischemia, and obstruction must be considered.  Her exam does not indicate peritoneal signs, she is afebrile and her WBC is normal. Abdominal XR shows no evidence of acute processes. Additionally, she has run out of her Fentanyl, Phenergan and Oxycodone; narcotic withdrawal and inadequate symptom management of pain may be contributing.    - Patient has received bolus NS, maintenance fluids at 165mL/h; morphine 4mg  x2 and Zofran.  She desires additional fluids and being allowed to leave from the ER.    - Dr. Coralee Pesa, patient's PCP, has been notified of patient's status. Dr Coralee Pesa evaluated the patient and since patient is not agreeable to admission, plans to send the patient home with few doses of percocet and refill of phenergan. Patients needs to call her surgeon for a refill of her chronic pain meds as surgery is the sole prescriber of opiates. The patient does not wish to stay past 10 or 11PM tonight given her many animals unattended at her home.                -Surgery was informally consulted and we talked to Dr Dwain Sarna and does not feel an urgent examination by them is warranted at this time, as her abdomen is not  likely a surgical problem presently with abdominal xary findings and the  fact that she is having good output from her ostomy bag.   - She will follow up with Dr. Dwain Sarna in surgery in a regularly scheduled appointment next week   -  Rx Phenergan and Percocet (2)Glycosuria and serum glucose 179: Patient has no history of diabetes; consider outpatient screening for DM.   Shanda Bumps, MS4   Lars Mage, R2 resident   Dr Nyra Capes, Attending

## 2010-06-16 NOTE — Assessment & Plan Note (Signed)
Summary: EST-SICK VISIT/CH   Vital Signs:  Misty [name] [name] profile:   60 year old Misty [name] [name] Height:      64.25 inches (163.19 cm) Weight:      100.4 pounds (Misty [name].64 kg) BMI:     17.16 Temp:     96.8 degrees F (36 degrees C) oral Pulse rate:   71 / minute BP sitting:   117 / 57  (left arm) Cuff size:   small  Vitals Entered By: Cynda Familia Duncan Dull) (May 04, 2010 1:38 PM) CC: follow-up visit Is Misty [name] [name] Diabetic? No Pain Assessment Misty [name] [name] in pain? yes     Location: stoma site Intensity: 7 Type: stinging Onset of pain  Chronic Nutritional Status BMI of < 19 = underweight  Does Misty [name] [name] need assistance? Functional Status Self care Ambulation Normal   Primary Care Provider:  Dr Coralee Pesa  CC:  follow-up visit.  History of Present Illness: Misty [name] Misty [name] [name] who comes in for hospital follow up and several concerns;  1. Depression/anxiety: Misty [name] Misty [name] [name] was taken off of her trazadone and celexa in [name] because of post-op complication of polymorphic V-tach. Her baseline EKG shows prolonged QT interval. Today she comes in and says she :needs something for her nerves.  2. Tobacco abuse: Misty [name] [name] says she has cut down to <1 ppd which she says is significant progress for her. We discussed again how important it was for her to quit-post-op respiratory failure, cardiac compromise, etc. She has taken Wellbutrin in Misty [name] past and done fine on it.  3. Malnutrition: She has lost 37 pounds since before this surgery. I spoke with Dr. Gaston Islam yesterday who feels she needs TPN for good wound healing and overall health and I certainly agree. She is still afraid to eat much.  4. Post op pain: Misty [name] Misty [name] [name] called here yesterday asking for something for her pain. Drs. Strek and Dwain Sarna have been giving her a fentanyl patch and percocet and as we have all discussed, they will continue to treat her pain. I learned from Dr. Gaston Islam that back in Misty [name] 1980's she actually had had seizures from Misty [name] demerol she was on  for post-op pain, and had had trouble with high doses of pain medication.  5 Cardiac: Misty [name] [name] has recently has a negative stress test done at [facility] cardiology. They are still wanting to see her from an EP standpoint regarding her abnormal EKG (prolonged QT), and polymorphic V-tach.  Depression History:      Misty [name] Misty [name] [name] is having a depressed mood most of Misty [name] day but denies diminished interest in her usual daily activities.        Misty [name] Misty [name] [name] denies that she wishes that she were dead and denies that she has thought about ending her life.         Preventive Screening-Counseling & Management  Alcohol-Tobacco     Smoking Status: current     Smoking Cessation Counseling: yes     Packs/Day: 1.0     Year Started: started at 72  Current Medications (verified): 1)  Promethazine Hcl 25 Mg Tabs (Promethazine Hcl) .... Take 1 Tablet By Mouth Every 6 Hours As Needed For Nausea 2)  Fosamax 70 Mg Tabs (Alendronate Sodium) .... Take One Tab Once A Week 3)  Metoprolol Tartrate 25 Mg Tabs (Metoprolol Tartrate) .... One Half A Tab Every 8 Hours 4)  Fentanyl 50 Mcg/hr Pt72 (Fentanyl) .... Change Every 3 Days 5)  Oxycodone-Acetaminophen 5-325 Mg Tabs (Oxycodone-Acetaminophen) .Marland Kitchen.. 1-2 By Mouth Q 4 Hours As Needed-Prescibed By Gi Surgery 6)  Bupropion Hcl 150 Mg Xr12h-Tab (Bupropion Hcl) .Marland Kitchen.. 1 A Day For 1 Week, Then Start Taking Two Times A Day  Allergies (verified): 1)  ! Codeine  Past History:  Past Medical History: Crohn's disease, diagnosed 1980s status post multiple surgeries eventual total colectomy, ileostomy Anxiety Chronic headaches Depression Pneumonia Occasional UTI s/p surgery 04/13/10  Family History: Reviewed history from 01/31/2010 and no changes required. Crohn's disease, brother  Social History: Reviewed history from 01/31/2010 and no changes required. single, lives with 18 birds, 3 dogs and 2 cats has a very supportive younger brother who also has Crohn's Current  Smoker Alcohol use-no   Review of Systems General:  Complains of loss of appetite and weight loss; denies chills, fever, and sleep disorder; 37 pounds total. CV:  Denies chest pain or discomfort and palpitations. Resp:  Denies cough and shortness of breath. GI:  Complains of abdominal pain; denies nausea and vomiting. Psych:  Complains of anxiety.  Physical Exam  General:  alert.  cachectic appearing Head:  normocephalic and atraumatic.   Eyes:  vision grossly intact.   Neck:  supple and full ROM.   Lungs:  normal respiratory effort.  slightly decreased breath sounds at Misty [name] bases but clear Heart:  normal rate, regular rhythm, and no murmur.   Abdomen:  soft and normal bowel sounds.  minimal tenderness, fentanyl patch on LUQ Msk:  normal ROM.   Extremities:  very thin, no edema Psych:  Oriented X3 and moderately anxious.     Impression & Recommendations:  Problem # 1:  DEPRESSION, SITUATIONAL (ICD-309.0) I have spoken with Dr. Ladona Ridgel, EP, regarding what anti-depressants would be safe, if any with her prolonged QT. Misty [name] only one that is a possibility is Wellbutrin. This is a good choice for her as it has worked for her before and may help with tobacco cessation. However it is not a great drug for anxiety. Will begin Wellbutrin SR 150mg  a day for 1 week and then increase to 150mg  two times a day. She will follow up in a few weeks.  Problem # 2:  TOBACCO ABUSE (ICD-305.1) Again will begin Wellbutrin and have counseled about this at length today.  Problem # 3:  MALNUTRITION (ICD-263.9) Misty [name] [name] will have a PICC line placed 05/06/10 in radiology. She has been approved for TPN which Home Health will provide and should begin 05/07/10.  Greater than Misty [name] minutes with >50% spent face to face with coordination of care for setting up TPN, discussing her care with 2 other MDs, ETC.  Problem # 4:  ABDOMINAL PAIN (ICD-789.00) Currently being treated by GI surgery and she seems pretty comfortable  today.  Complete Medication List: 1)  Promethazine Hcl 25 Mg Tabs (Promethazine hcl) .... Take 1 tablet by mouth every 6 hours as needed for nausea 2)  Fosamax 70 Mg Tabs (Alendronate sodium) .... Take one tab once a week 3)  Metoprolol Tartrate 25 Mg Tabs (Metoprolol tartrate) .... One half a tab every 8 hours 4)  Fentanyl 50 Mcg/hr Pt72 (Fentanyl) .... Change every 3 days 5)  Oxycodone-acetaminophen 5-325 Mg Tabs (Oxycodone-acetaminophen) .Marland Kitchen.. 1-2 by mouth q 4 hours as needed-prescibed by gi surgery 6)  Bupropion Hcl 150 Mg Xr12h-tab (Bupropion hcl) .Marland Kitchen.. 1 a day for 1 week, then start taking two times a day  Misty [name] [name] Instructions: 1)  Please schedule a follow-up appointment as needed. 2)  You are scheduled to get a PICC line on Friday morning. 3)  We will be in touch about starting antidepressants  once I speak with cardiology. 4)  Take care! Prescriptions: BUPROPION HCL 150 MG XR12H-TAB (BUPROPION HCL) 1 a day for 1 week, then start taking two times a day  #60 x 6   Entered and Authorized by:   Zoila Shutter MD   Signed by:   Zoila Shutter MD on 05/04/2010   Method used:   Electronically to        [phone] (retail)       [address] [place], [place]  [address]       Ph: [phone] or [phone]       Fax: [phone]   RxID:   [id number]    Orders Added: 1)  Est. Misty [name] [name] Level V [34742]   Immunization History:  Influenza Immunization History:    Influenza:  historical (02/22/2010)   Immunization History:  Influenza Immunization History:    Influenza:  Historical (02/22/2010)  Prevention & Chronic Care Immunizations   Influenza vaccine: Historical  (02/22/2010)    Tetanus booster: 01/13/2009: Historical    Pneumococcal vaccine: Historical  (08/13/2009)  Colorectal Screening   Hemoccult: Not documented    Colonoscopy: Not documented  Other Screening   Pap smear: Not documented    Mammogram: ASSESSMENT: Negative -  BI-RADS 1^MM DIGITAL SCREENING  (11/23/2009)   Mammogram action/deferral: Ordered  (11/09/2009)   Smoking status: current  (05/04/2010)   Smoking cessation counseling: yes  (05/04/2010)  Lipids   Total Cholesterol: Not documented   LDL: Not documented   LDL Direct: Not documented   HDL: Not documented   Triglycerides: Not documented

## 2010-06-16 NOTE — Progress Notes (Signed)
Summary: nuc pre-procedure  Phone Note Outgoing Call   Call placed by: Harlow Asa CNMT Call placed to: Patient Reason for Call: Confirm/change Appt Summary of Call: Left message with information on [employer] Information Sheet (see scanned document for details).      Nuclear Med Background Indications for Stress Test: Evaluation for Ischemia, Post Hospital  Indications Comments: 04/13/10 Hosp. for ileostomy revision>>04/15/10 Surg. >>Post OP Day #2 >>Polymorphic VT with Assoc. CP & SOB  History: Echo  History Comments: 04/15/10 EF = 50-55%  Symptoms: Chest Pain, SOB    Nuclear Pre-Procedure Cardiac Risk Factors: Smoker Height (in): 64.25

## 2010-06-17 ENCOUNTER — Encounter: Payer: Self-pay | Admitting: Internal Medicine

## 2010-06-17 ENCOUNTER — Emergency Department (HOSPITAL_COMMUNITY): Payer: Medicare Other

## 2010-06-17 ENCOUNTER — Inpatient Hospital Stay (HOSPITAL_COMMUNITY)
Admission: EM | Admit: 2010-06-17 | Discharge: 2010-06-20 | DRG: 392 | Disposition: A | Discharge status: Home Health | Payer: Medicare Other | Attending: Infectious Diseases | Admitting: Infectious Diseases

## 2010-06-17 DIAGNOSIS — F341 Dysthymic disorder: Secondary | ICD-10-CM | POA: Diagnosis present

## 2010-06-17 DIAGNOSIS — R112 Nausea with vomiting, unspecified: Secondary | ICD-10-CM | POA: Diagnosis present

## 2010-06-17 DIAGNOSIS — K2970 Gastritis, unspecified, without bleeding: Secondary | ICD-10-CM | POA: Diagnosis present

## 2010-06-17 DIAGNOSIS — Z933 Colostomy status: Secondary | ICD-10-CM

## 2010-06-17 DIAGNOSIS — K2990 Gastroduodenitis, unspecified, without bleeding: Secondary | ICD-10-CM | POA: Diagnosis present

## 2010-06-17 DIAGNOSIS — K3184 Gastroparesis: Secondary | ICD-10-CM | POA: Diagnosis present

## 2010-06-17 DIAGNOSIS — I2510 Atherosclerotic heart disease of native coronary artery without angina pectoris: Secondary | ICD-10-CM | POA: Diagnosis present

## 2010-06-17 DIAGNOSIS — M899 Disorder of bone, unspecified: Secondary | ICD-10-CM | POA: Diagnosis present

## 2010-06-17 DIAGNOSIS — Z79899 Other long term (current) drug therapy: Secondary | ICD-10-CM

## 2010-06-17 DIAGNOSIS — Z7982 Long term (current) use of aspirin: Secondary | ICD-10-CM

## 2010-06-17 DIAGNOSIS — R109 Unspecified abdominal pain: Principal | ICD-10-CM | POA: Diagnosis present

## 2010-06-17 DIAGNOSIS — I959 Hypotension, unspecified: Secondary | ICD-10-CM | POA: Diagnosis present

## 2010-06-17 DIAGNOSIS — E876 Hypokalemia: Secondary | ICD-10-CM | POA: Diagnosis not present

## 2010-06-17 DIAGNOSIS — M6250 Muscle wasting and atrophy, not elsewhere classified, unspecified site: Secondary | ICD-10-CM | POA: Diagnosis present

## 2010-06-17 LAB — URINALYSIS, ROUTINE W REFLEX MICROSCOPIC
Ketones, ur: NEGATIVE mg/dL
Specific Gravity, Urine: 1.028 (ref 1.005–1.030)
Urine Glucose, Fasting: NEGATIVE mg/dL
pH: 5 (ref 5.0–8.0)

## 2010-06-17 LAB — DIFFERENTIAL
Lymphs Abs: 1.8 10*3/uL (ref 0.7–4.0)
Monocytes Relative: 7 % (ref 3–12)
Neutro Abs: 5.6 10*3/uL (ref 1.7–7.7)
Neutrophils Relative %: 69 % (ref 43–77)

## 2010-06-17 LAB — URINE MICROSCOPIC-ADD ON

## 2010-06-17 LAB — CBC
Hemoglobin: 13.5 g/dL (ref 12.0–15.0)
MCH: 29.9 pg (ref 26.0–34.0)
MCV: 90.7 fL (ref 78.0–100.0)
RBC: 4.52 MIL/uL (ref 3.87–5.11)

## 2010-06-17 LAB — COMPREHENSIVE METABOLIC PANEL
AST: 20 U/L (ref 0–37)
Alkaline Phosphatase: 101 U/L (ref 39–117)
CO2: 23 mEq/L (ref 19–32)
Chloride: 103 mEq/L (ref 96–112)
Creatinine, Ser: 0.76 mg/dL (ref 0.4–1.2)
GFR calc Af Amer: >60 mL/min (ref >60)
GFR calc non Af Amer: >60 mL/min (ref >60)
Total Bilirubin: 0.6 mg/dL (ref 0.3–1.2)

## 2010-06-17 LAB — CK TOTAL AND CKMB (NOT AT ARMC): CK, MB: 2.6 ng/mL (ref 0.3–4.0)

## 2010-06-18 ENCOUNTER — Observation Stay (HOSPITAL_COMMUNITY): Payer: Medicare Other

## 2010-06-18 DIAGNOSIS — R109 Unspecified abdominal pain: Secondary | ICD-10-CM

## 2010-06-18 DIAGNOSIS — K5090 Crohn's disease, unspecified, without complications: Secondary | ICD-10-CM

## 2010-06-18 LAB — CBC
HCT: 31.4 % — ABNORMAL LOW (ref 36.0–46.0)
Hemoglobin: 10.1 g/dL — ABNORMAL LOW (ref 12.0–15.0)
RBC: 3.45 MIL/uL — ABNORMAL LOW (ref 3.87–5.11)
WBC: 4.5 10*3/uL (ref 4.0–10.5)

## 2010-06-18 LAB — BASIC METABOLIC PANEL
CO2: 24 mEq/L (ref 19–32)
GFR calc Af Amer: >60 mL/min (ref >60)
Glucose, Bld: 107 mg/dL — ABNORMAL HIGH (ref 70–99)
Potassium: 3.2 mEq/L — ABNORMAL LOW (ref 3.5–5.1)
Sodium: 136 mEq/L (ref 135–145)

## 2010-06-18 LAB — CARDIAC PANEL(CRET KIN+CKTOT+MB+TROPI): Relative Index: 0.8 (ref 0.0–2.5)

## 2010-06-18 MED ORDER — IOHEXOL 300 MG/ML  SOLN
80.0000 mL | Freq: Once | INTRAMUSCULAR | Status: AC | PRN
  Administered 2010-06-18: 80 mL via INTRAVENOUS

## 2010-06-19 ENCOUNTER — Observation Stay (HOSPITAL_COMMUNITY): Payer: Medicare Other

## 2010-06-19 LAB — CARDIAC PANEL(CRET KIN+CKTOT+MB+TROPI)
Relative Index: 0.3 (ref 0.0–2.5)
Total CK: 427 U/L — ABNORMAL HIGH (ref 7–177)

## 2010-06-19 LAB — TECHNOLOGIST SMEAR REVIEW

## 2010-06-19 LAB — CBC
HCT: 30.9 % — ABNORMAL LOW (ref 36.0–46.0)
Hemoglobin: 9.7 g/dL — ABNORMAL LOW (ref 12.0–15.0)
MCH: 29.3 pg (ref 26.0–34.0)
MCHC: 31.4 g/dL (ref 30.0–36.0)
RBC: 3.31 MIL/uL — ABNORMAL LOW (ref 3.87–5.11)

## 2010-06-19 LAB — BASIC METABOLIC PANEL
BUN: <1 mg/dL — ABNORMAL LOW (ref 6–23)
Calcium: 8 mg/dL — ABNORMAL LOW (ref 8.4–10.5)
GFR calc non Af Amer: >60 mL/min (ref >60)
Potassium: 3.3 mEq/L — ABNORMAL LOW (ref 3.5–5.1)
Sodium: 138 mEq/L (ref 135–145)

## 2010-06-19 LAB — URINE CULTURE: Colony Count: NO GROWTH

## 2010-06-20 LAB — CBC
Hemoglobin: 10.4 g/dL — ABNORMAL LOW (ref 12.0–15.0)
MCH: 29.4 pg (ref 26.0–34.0)
MCHC: 31.9 g/dL (ref 30.0–36.0)
Platelets: 145 10*3/uL — ABNORMAL LOW (ref 150–400)
RDW: 12.6 % (ref 11.5–15.5)

## 2010-06-20 LAB — BASIC METABOLIC PANEL
CO2: 23 mEq/L (ref 19–32)
Calcium: 8.5 mg/dL (ref 8.4–10.5)
Creatinine, Ser: 0.7 mg/dL (ref 0.4–1.2)
GFR calc Af Amer: >60 mL/min (ref >60)
GFR calc non Af Amer: >60 mL/min (ref >60)
Sodium: 140 mEq/L (ref 135–145)

## 2010-06-20 LAB — TSH: TSH: 2.537 u[IU]/mL (ref 0.350–4.500)

## 2010-06-21 ENCOUNTER — Other Ambulatory Visit: Payer: Self-pay | Admitting: Internal Medicine

## 2010-06-21 ENCOUNTER — Telehealth: Payer: Self-pay | Admitting: *Deleted

## 2010-06-21 MED ORDER — OMEPRAZOLE 40 MG PO CPDR: 40.0000 mg | DELAYED_RELEASE_CAPSULE | Freq: Every day | ORAL | Status: DC

## 2010-06-21 NOTE — Telephone Encounter (Signed)
Corrie Dandy, pharm at [facility] calls to say that pt's PICC line care will not be covered by insurance carrier because pt is not homebound, arrangements need to be made please call her for plan of care at [phone]

## 2010-06-22 NOTE — Telephone Encounter (Signed)
I left patient a message to call back in about waht she wanted to do about PICC line.

## 2010-06-22 NOTE — Initial Assessments (Signed)
INTERNAL MEDICINE ADMISSION HISTORY AND PHYSICAL  First Contact: Dr. Dorthula Rue  [phone] Second Contact: Dr. Denton Meek  [phone] Weekends, [place], or after 5pm weekdays: First Contact: [phone] Second Contact:[phone]  PCP: Dr. Coralee Pesa  CC: abdominal pain  HPI: 60 y/o woman with PMH significant for Crohn's disease s/p total colectomy ,ileostomy with ileostomy revision in Dec 2011 who comes to the ER with the chief complaint of abdominal pain that started last night around 3 o'clock. She describes her pain -" 10 lbs of weight in her stomach", throbbing or aching in nature,  10/10 in severity, located all over her abdomen but mostly in the epigastrium. The pain is associated with nausea and dry heaves but denies any vomiting. Nothing makes it better or worse. She reports that she has been having these episodic flares for last month occuring at a frequency of 1-2 episodes/week.  She describes the contents of colostomy as greenish,loose stools that has been unchanged from the past and endorse around 10 lbs weight loss over a period of 1 month. She also reports occassional palpitations but denies chest pain, SOB. 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 She was recently seen in the clinic by Dr. Coralee Pesa around 3- 4  days ago when her TPN was stopped.  ALLERGIES:  ! CODEINE  ! HEPARIN  PAST MEDICAL HISTORY: Crohn's disease, diagnosed 1980s status post multiple surgeries eventual total colectomy, ileostomy Anxiety Chronic headaches Depression Pneumonia Occasional UTI s/p surgery 04/13/10  MEDICATIONS: PROMETHAZINE HCL 25 MG TABS (PROMETHAZINE HCL) Take 1 tablet by mouth every 6 hours as needed for nausea FOSAMAX 70 MG TABS (ALENDRONATE SODIUM) Take one tab once a week METOPROLOL TARTRATE 25 MG TABS (METOPROLOL TARTRATE) one half a tab every 8 hours OXYCODONE-ACETAMINOPHEN 5-325  MG TABS (OXYCODONE-ACETAMINOPHEN) 1-2 by mouth q 4 hours as needed-prescibed by GI surgery BUPROPION HCL 150 MG XR12H-TAB (BUPROPION HCL) 1 a day for 1 week, then start taking two times a day   SOCIAL HISTORY:  single, lives with 18 birds, 3 dogs and 2 cats has a very supportive younger brother who also has Crohn's Current Smokes 1/2 PPD for 35 years Alcohol use-no    FAMILY HISTORY  Crohn's disease, brother  ROS: As per HPI   VITALS: T: 97.9 P:92  BP: 164/92 R:16  O2SAT:99%  ON: RA  PHYSICAL EXAM: Gen: Patient is in NAD, Pleasant. Eyes: PERRL, EOMI, No signs of anemia or jaundince. ENT: MMM, OP clear, No erythema, thrush or exudates. Neck: Supple, No carotid Bruits, No JVD, No thyromegaly Resp: CTA- Bilaterally, No W/C/R. CVS: S1S2 RRR, No M/R/G GI: Abdomen is soft. ND,diffusely tender to palpation in all four quadrants mostly in the epigastrium, NG, NR, rebound tendernes present, BS+, colostomy in place. No organomegaly. Ext: No pedal edema, cyanosis or clubbing. GU: No CVA tenderness. Skin: No visible rashes, scars. Lymph: No palpable lymphadenopathy. MS: Moving all 4 extremities. Neuro: A&O X3, CN II - XII are grossly intact. Motor strength is 5/5 in the all 4 extremities, Sensations intact to light touch, Gait normal, Cerebellar signs negative. Psych: Appropriate  LABS:   WBC                                      8.2  4.0-10.5         K/uL  RBC                                      4.52              3.87-5.11        MIL/uL  Hemoglobin (HGB)                         13.5              12.0-15.0        g/dL  Hematocrit (HCT)                         41.0              36.0-46.0        %  MCV                                      90.7              78.0-100.0       fL  MCH -                                    29.9              26.0-34.0        pg  MCHC                                     32.9              30.0-36.0        g/dL  RDW                                       12.9              11.5-15.5        %  Platelet Count (PLT)                     234               150-400          K/uL  Neutrophils, %                           69                43-77            %  Lymphocytes, %                           23                12-46            %  Monocytes, %  7                 3-12             %  Eosinophils, %                           1                 0-5              %  Basophils, %                             1                 0-1              %  Neutrophils, Absolute                    5.6               1.7-7.7          K/uL  Lymphocytes, Absolute                    1.8               0.7-4.0          K/uL  Monocytes, Absolute                      0.6               0.1-1.0          K/uL  Eosinophils, Absolute                    0.1               0.0-0.7          K/uL  Basophils, Absolute                      0.1               0.0-0.1          K/uL   Sodium (NA)                              137               135-145          mEq/L  Potassium (K)                            3.6               3.5-5.1          mEq/L  Chloride                                 103               96-112           mEq/L  CO2  23                19-32            mEq/L  Glucose                                  99                70-99            mg/dL  BUN                                      16                6-23             mg/dL  Creatinine                               0.76              0.4-1.2          mg/dL  GFR, Est Non African American            >60               >60              mL/min  GFR, Est African American                >60               >60              mL/min    Oversized comment, see footnote  1  Bilirubin, Total                         0.6               0.3-1.2          mg/dL  Alkaline Phosphatase                     101               39-117           U/L  SGOT (AST)                               20                 0-37             U/L  SGPT (ALT)                               17                0-35             U/L  Total  Protein                           7.3  6.0-8.3          g/dL  Albumin-Blood                            4.1               3.5-5.2          g/dL  Calcium                                  9.5               8.4-10.5         mg/dL   Lipase                                   27                11-59            U/L   ASSESSMENT AND PLAN: (1)Acute abdominal pain:  Etiology unclear. DD include gastroparesis vs small bowel obstruction vs acute mesenteric ischemia( given her h/o of polymorphic Vtach) vs stricture. Will admit her to telemetry. Will cycle her CEx 2, check her Vit B-12, folate, TSH, UA. Will treat her symptomatically with morphine and zofran for pain and nausea. We may consider starting  her on reglan.   (2)DEPRESSION/ANXIETY: Will hold her wellbutyrin for now.  (3)Tobacco Abuse: Nicotine patch.  (4)Polymorphic Vtach, hx [id number] stable, currently asymptomatic. Will follow up CE and EKG and would continue her on metoprolol. We may consider EP studies.  (5)VTE PROPH: SCD's  I performed and/or observed a history and physical examination of the patient.  I discussed the case with the residents as noted and reviewed the residents' notes.  I agree with the findings and plan--please refer to the attending physician note for more details. Signature Printed Name

## 2010-06-22 NOTE — Assessment & Plan Note (Signed)
Summary: EST-CAN'T EAT.WEIGT DOWN TO 95 PDS SINCE LOVSTOMACH PAIN/CFB   Vital Signs:  Patient profile:   60 year old female Height:      64.25 inches (163.19 cm) Weight:      97.0 pounds (44.09 kg) BMI:     16.58 Temp:     97.1 degrees F (36.17 degrees C) oral Pulse rate:   80 / minute BP sitting:   104 / 63  (right arm) Cuff size:   small  Vitals Entered By: Cynda Familia Duncan Dull) (June 14, 2010 1:29 PM) CC: nausea off and on x 3wks, , Depression Is Patient Diabetic? No Nutritional Status BMI of < 19 = underweight  Have you ever been in a relationship where you felt threatened, hurt or afraid?No   Does patient need assistance? Functional Status Self care Ambulation Normal   Primary Care Provider:  Dr Coralee Pesa  CC:  nausea off and on x 3wks, , and Depression.  History of Present Illness: 60 yr old who comes in for follow up of   1. Recurrent episodes of n/v and abdominal pain. She brings in a diary with the last week of recording how she has been feeling. For 4-5 days she ate well. Then with no provocation she again developed n/v. When she tried to eat again she did not pick the best food choices-fish sandwhich,etc. She wonders if it is relaed to TPN, Crohn's, she c/o crampy abdominal pain along with it. It is not associated with fever, chills or sweats. Phenergan and percocet do not help at all. She also feels like she is vomiting up food that was eaten several hours prior.  2. Malnutrition: Her nutrition is much better with last labs checked on 06/08/10: albumin-3.8, prealbumin 22.7, both of which are normal. So even though her weight is still down at 97 pounds her nutritional status is better.  3. Post-op: Ms. Korte does feel that the surgery helped and he chronic pain in the abdomen is decreased, and the osomy is fitting better. Probably the improves nutrition helped with this.  4. Depression: She is less depressed, but her sleep is not better. Because of her wide  complex tachycardia post op, she was taken off of celexa and trazadone. She does feel like the wellbutrin is helping with the depression and possibly with the decrease in desire for tobacco. I will check into possibly treating her with ambien for sleep.  Depression History:      The patient is having a depressed mood most of the day.         Preventive Screening-Counseling & Management  Alcohol-Tobacco     Smoking Status: current     Smoking Cessation Counseling: yes     Packs/Day: 1.0     Year Started: started at 38  Current Medications (verified): 1)  Promethazine Hcl 25 Mg Tabs (Promethazine Hcl) .... Take 1 Tablet By Mouth Every 6 Hours As Needed For Nausea 2)  Fosamax 70 Mg Tabs (Alendronate Sodium) .... Take One Tab Once A Week 3)  Metoprolol Tartrate 25 Mg Tabs (Metoprolol Tartrate) .... One Half A Tab Every 8 Hours 4)  Oxycodone-Acetaminophen 5-325 Mg Tabs (Oxycodone-Acetaminophen) .Marland Kitchen.. 1-2 By Mouth Q 4 Hours As Needed-Prescibed By Gi Surgery 5)  Bupropion Hcl 150 Mg Xr12h-Tab (Bupropion Hcl) .Marland Kitchen.. 1 A Day For 1 Week, Then Start Taking Two Times A Day  Allergies: 1)  ! Codeine  Review of Systems       nausea, vomiting, and as per HPI  Physical Exam  General:  very thin, a little better than the last time I saw here, not acutely ill Eyes:  vision grossly intact.   Mouth:  mucus membranes moist Neck:  supple.   Lungs:  normal respiratory effort and normal breath sounds.   Heart:  normal rate, regular rhythm, and no murmur.   Abdomen:  soft and normal bowel sounds.  colostomy bag in place without lekage, less tender than usual Extremities:  no edema, PICC line in and running Neurologic:  alert & oriented X3 and strength normal in all extremities.   Psych:  Oriented X3 and depressed affect.     Impression & Recommendations:  Problem # 1:  NAUSEA AND VOMITING (ICD-787.01) The etiology of these episodes is not entirely clear. This could be from TPN or possibly some  gastroparesis. I have discussed at length with Dr. Gaston Islam and will change to D 10 for 10 days and see how she does. If this helps we may be able to hep lock PICC line and just continue with by mouth intake. I may add a low dose reglan after this.  Orders: T-CMP with Estimated GFR ([id number])  Problem # 2:  MALNUTRITION (ICD-263.9) Thankfully this is better with the TPN.  Problem # 3:  DEPRESSION, SITUATIONAL (ICD-309.0) As per HPI, will continue wellbutrin and consider beginning ambien.  Problem # 4:  ABSCESS, TOOTH (ICD-522.5) Better post antibiotics.  Problem # 5:  TOBACCO ABUSE (ICD-305.1) Continue wellbutrin and counselling.  Problem # 6:  DYSURIA (ICD-788.1) She did have a UTI that waa treated with the course of augmentin that she took. The following medications were removed from the medication list:    Amoxicillin-pot Clavulanate 875-125 Mg Tabs (Amoxicillin-pot clavulanate) .Marland Kitchen... Take 1 two times a day Greater than 45 minutes spent with >50% spent face to face with counseling and with coordination of care discussing patient's complicated medical history and problems,etc.  Complete Medication List: 1)  Promethazine Hcl 25 Mg Tabs (Promethazine hcl) .... Take 1 tablet by mouth every 6 hours as needed for nausea 2)  Fosamax 70 Mg Tabs (Alendronate sodium) .... Take one tab once a week 3)  Metoprolol Tartrate 25 Mg Tabs (Metoprolol tartrate) .... One half a tab every 8 hours 4)  Oxycodone-acetaminophen 5-325 Mg Tabs (Oxycodone-acetaminophen) .Marland Kitchen.. 1-2 by mouth q 4 hours as needed-prescibed by gi surgery 5)  Bupropion Hcl 150 Mg Xr12h-tab (Bupropion hcl) .Marland Kitchen.. 1 a day for 1 week, then start taking two times a day  Other Orders: T-Sed Rate (Automated) ([id number]) T-CBC w/Diff ([id number])  Patient Instructions: 1)  Please schedule a follow-up appointment in 2 months. 2)  Home health is going to change TPN to D10 for 10 days. 3)  We will check lab work today.   Orders  Added: 1)  T-Sed Rate (Automated) [[id number]] 2)  T-CMP with Estimated GFR [[id number]] 3)  T-CBC w/Diff [[id number]]   Process Orders Check Orders Results:     [facility]: Check successful Tests Sent for requisitioning (June 14, 2010 4:33 PM):     06/14/2010: [facility] -- T-Sed Rate (Automated) [[id number]] (signed)     06/14/2010: [facility] -- T-CMP with Estimated GFR [[id number]] (signed)     06/14/2010: [facility] -- [facility] w/Diff [[id number]] (signed)     Prevention & Chronic Care Immunizations   Influenza vaccine: Historical  (02/22/2010)    Tetanus booster: 01/13/2009: Historical    Pneumococcal vaccine: Historical  (08/13/2009)  Colorectal Screening   Hemoccult:  Not documented    Colonoscopy: Not documented  Other Screening   Pap smear: Not documented    Mammogram: ASSESSMENT: Negative - BI-RADS 1^MM DIGITAL SCREENING  (11/23/2009)   Mammogram action/deferral: Ordered  (11/09/2009)   Smoking status: current  (06/14/2010)   Smoking cessation counseling: yes  (06/14/2010)  Lipids   Total Cholesterol: Not documented   LDL: Not documented   LDL Direct: Not documented   HDL: Not documented   Triglycerides: Not documented   Blood drawn from left arm double lumen PICC line for labs without difficulty per protocol.  Flushed with 10cc NS and Heparin 2.5cc per protocl.  Chinita Pester RN  June 14, 2010 3:30 PM   Process Orders Check Orders Results:     [facility]: Check successful Tests Sent for requisitioning (June 14, 2010 4:33 PM):     06/14/2010: [facility] -- T-Sed Rate (Automated) [[id number]] (signed)     06/14/2010: [facility] -- T-CMP with Estimated GFR [[id number]] (signed)     06/14/2010: [facility] -- [facility] w/Diff [[id number]] (signed)

## 2010-06-22 NOTE — Progress Notes (Signed)
Summary: cancels appts/ hla  Phone Note Other Incoming   Summary of Call: Barbara from [facility] to say pt is cancelling appts and not being present for appts, if this behavior continues pt will be discharged, may call barbara at [phone] for ?'s.  Initial call taken by: Marin Roberts RN,  June 08, 2010 2:35 PM  Follow-up for Phone Call        Noted. Follow-up by: Zoila Shutter MD,  June 10, 2010 3:33 PM

## 2010-06-24 ENCOUNTER — Telehealth: Payer: Self-pay | Admitting: *Deleted

## 2010-06-24 NOTE — Telephone Encounter (Signed)
I called [facility] and gave them the order to come and remove the PICC line. She was not started on Zoloft in the hospital, she was started on Effexor. However I do not feel comfortable with her being on any other anti-depressants until after she sees cardiology again. She is supposed to see EP regarding her prolonged QT interval and wide complex tachycardia while in the hospital. I will have Purnell Shoemaker check on the status of that appointment.

## 2010-06-24 NOTE — Telephone Encounter (Signed)
Call from pt said that she needed to leave the name of her pharmacy for her prescription for Zolofot.  Pt uses the [facility] on [address].  Pt also said that she has a PICC line that is not being used and would like to know if she can get this removed.  [facility] is no longer coming out to care for the line.

## 2010-07-01 NOTE — Telephone Encounter (Signed)
Spoke with Leila at [facility], she will contact pt directly with an appointment after she check on the hospital note and doctor of the day.

## 2010-07-04 ENCOUNTER — Encounter: Payer: Self-pay | Admitting: *Deleted

## 2010-07-06 DIAGNOSIS — Z8679 Personal history of other diseases of the circulatory system: Secondary | ICD-10-CM | POA: Insufficient documentation

## 2010-07-07 ENCOUNTER — Encounter: Payer: Self-pay | Admitting: Cardiovascular Disease

## 2010-07-07 ENCOUNTER — Encounter (INDEPENDENT_AMBULATORY_CARE_PROVIDER_SITE_OTHER): Payer: Medicare Other | Admitting: Cardiovascular Disease

## 2010-07-07 DIAGNOSIS — F17200 Nicotine dependence, unspecified, uncomplicated: Secondary | ICD-10-CM

## 2010-07-07 DIAGNOSIS — I472 Ventricular tachycardia: Secondary | ICD-10-CM

## 2010-07-07 NOTE — Discharge Summary (Signed)
NAME:  Misty [name], Misty [name]              ACCOUNT NO.:  [id number]  MEDICAL RECORD NO.:  [id number]           PATIENT TYPE:  I  LOCATION:  4739                         FACILITY:  [facility]  PHYSICIAN:  Lacretia Leigh. Tiant Peixoto, M.D.DATE OF BIRTH:  [birth year]  DATE OF ADMISSION:  06/17/2010 DATE OF DISCHARGE:  06/20/2010                              DISCHARGE SUMMARY   DISCHARGE DIAGNOSES: Acute 1. Abdominal pain with nausea and vomiting, acute on chronic, likely     secondary to gastritis versus gastroparesis. 2. Anxiety and depression--long-standing. 3. Hypotension Chronic problems 4. Crohn's disease, diagnosed greater than 30 years     ago, status post colectomy and multiple small bowel resections with     recent revision of ileostomy on April 13, 2010. 5. Osteopenia treated with fosamax 6. 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 7. History of heparin-induced thrombocytopenia with positive heparin-     induced thrombocytopenia panel on April 15, 2010.  DISCHARGE MEDICATIONS: 1. Percocet 5/325 mg 1-2 p.o. q.4 h. as needed for pain. 2. Metoclopramide 5 mg p.o. t.i.d. before each meal and each night. 3. Fosamax 70 mg p.o. once a week. 4. Ambien 5 mg p.o. nightly p.r.n. as needed for sleep. 5. Effexor 37.5 mg p.o. daily. 6. Metoprolol tartrate 12.5 mg p.o. b.i.d. 7. Bupropion 150 mg 1 a day for 1 week and then b.i.d. 8. Protonix 40 mg p.o. b.i.d.  DISPOSITION AND FOLLOWUP:  The patient is improved with regard to her abdominal pain and nausea and vomiting at discharge.  She had a negative work-up for serious intra-abdominal pathology.  She will follow up on July 11, 2010, at 9:15 at [facility] with Dr. Mliss Sax.  At the time of that appointment, she will need: 1. A BMET  for hypokalemia. 2. A repeat EKG for QT prolongation in the context of new Effexor     dosing.  At that time, we  can consider advancing her Effexor dosing     as she is currently on low dose. 3. Follow-up of intravascular abdominal pain suspecting prominent psychosomatic     component to pain. 4.  Consider referral to Mental Heath or Behavioral Health for evaluation.  PROCEDURES: 1. On June 17, 2010, abdominal 2-view x-ray showed nonspecific     bowel gas pattern with gas-filled small bowel within the pelvis,     but a paucity of bowel gas elsewhere.  No free intraperitoneal air. 2. Two-view chest x-ray on June 17, 2010, showing COPD without     acute disease and resolution of bilateral pleural effusions and     bibasilar airspace disease. 3. A CT of abdomen and pelvis on June 18, 2010, showing the patient     status post colectomy without evidence of active Crohn disease,     decreased versus resolved cul-de-sac fluid, equivocal edema     adjacent to the gallbladder.  No calcified stones and apparent     gastric wall thickening which could be due to underdistention     correlate with any symptoms that suggest gastritis. 4.  Ultrasound of abdomen and pelvis on June 19, 2010, showing no     evidence of gallbladder wall thickening, cholelithiasis, or     sonographic Murphy sign and no demonstrated acute abdominal     findings.  CONSULTATIONS:  None.  ADMITTING HISTORY AND PHYSICAL:  Misty [name] is a 60 year old woman with a past medical history significant for Crohn disease, status post total colectomy, ileostomy with ileostomy revision in December 2011 who comes to the ER with a chief complaint of abdominal pain that started the night prior to admission around 3 o'clock in the morning.  She describes her pain as throbbing or aching in nature, 10/10 severity, located all of her abdomen, but mostly in the epigastrium.  The pain is associated with nausea and dry heaves, but denies any vomiting.  Nothing makes it better or worse.  She also reports a 10-pound weight loss over the  past month.  She reports that she has been having these episodic flares of 1-2 episodes per weeks.  She describes the contents of the colostomy as greenish loose stools that have been unchanged from past stools.  She also endorses occasional palpitations, but denies chest pains and shortness of breath and that the patient has a recent history of polymorphic V tach after her ileostomy revision in December 2001 that was thought to be postop ischemic/postop MI.  She was recently seen in clinic by Dr. Coralee Pesa at which time her TPN was discontinued as a possible cause of her nausea and vomiting.  Her PICC remained in place.  ADMISSION VITAL SIGNS:  Temperature is 97.9, pulse 92, blood pressure 164/92, respirations 16, and O2 sats 99% on room air.  PHYSICAL EXAMINATION:  GENERAL:  The patient is in no acute distress, pleasant. EYES:  Pupils are equal, round, and reactive to light.  Extraocular movements are intact.  No signs of anemia or jaundice. ENT:  Mucous membranes are moist.  Oropharynx is clear.  No erythema, thrush, or exudate. NECK:  Supple.  No carotid bruits.  No JVD.  No thyromegaly. RESPIRATION:  Clear to auscultation bilaterally.  No wheezes. CARDIOVASCULAR:  S1 and S2 normal.  Regular rate and rhythm.  No murmurs, rubs, or gallops. GI:  Abdomen is soft, nondistended, diffusely tender to palpation in all 4 quadrants mostly in the epigastrium.  Voluntary guarding and rebound tenderness present.  Bowel sounds are positive.  Colostomy in place.  No organomegaly appreciated. EXTREMITIES:  No pedal edema, cyanosis, or clubbing. GU:  No CVA tenderness. SKIN:  No visible rashes or scars. LYMPH NODES:  No palpable lymphadenopathy. MUSCULOSKELETAL:  Moving all 4 extremities. NEURO:  Alert and oriented x3.  Cranial nerves II-XII are grossly intact.  Motor strength is 5/5 in all 4 extremities.  Sensation is intact to light touch.  Gait is normal.  Cerebellar signs negative. PSYCH:   Tearful.  ADMISSION LABORATORY DATA:  White blood cells 8.2, red blood cells 4.52, hemoglobin 13.5, hematocrit 41 with an MCV of 90.7, RDW 12.9, and platelet count 234.  Sodium 137, potassium 3.6, chloride 103, bicarb 23, glucose 99, BUN 16, creatinine 0.76, GFR greater than 60, bilirubin 0.6, alkaline phosphatase 101, AST 20, ALT 17, total protein 7.3, albumin 4.1, calcium 9.5, lipase 27.  HOSPITAL COURSE BY PROBLEM: 1. Nausea and vomiting with abdominal pain:  Given her multiple     negative scans and exam not consistent with Crohn's flare, most     likely secondary to gastroparesis versus gastritis.  Of note, there  is most likely a prominent psychosomatic component to her abdominal     pain.  Over the course of her admission, she improved with regards     to pain and nausea with hydration and pain medications, although     the pain never completely resolved.  For this problem, we began     treatment with Reglan for gastroparesis and nausea.  We began     Effexor for anxiety as it is a component of her abdominal pain. She will     follow up regularly with her primary care     physician for continued reassurance as to the nature and     progression of her disease.  Of note, she should consider following    up with GI in order to assess her suitability for steroids and/or     other disease-modifying medications for her Crohn disease. 2. Hypotension:  Throughout her course of hospital, she had systolic     blood pressures in 90-100 range, likely secondary to poor oral     intake.  Also, during this hospitalization, she had hypokalemia, 2-     3.5, also likely secondary to poor oral intake.  With fluid     resuscitation and increased oral intake, her hypotension improved     within normal limits during her hospitalization.  At discharge, she     will be continued on metoprolol for rate control, but in the future     consider discontinuing this secondary to hypotension. 3.  Anxiety/depression:  Throughout her hospitalization, she exhibited     labile moods, tearfulness and expressed anxiety and fear about     going home as well as anxiety and fear about being in the hospital.     We started Effexor at a low dose in the     setting of previous QT elongation in the setting of SSRI treatment.     At followup, consider increasing the dose of Effexor if the EKG     does not show continued QT prolongation.  Consider referral to     Behavioral Health and/or Mental Health Services for prominent     anxiety and depression and possible personality disorder.  DISCHARGE LABORATORY DATA AND VITAL SIGNS:  VITAL SIGNS:  Temperature 97.9, pulse 62, respirations 20, systolic blood pressure 105/59, and O2 sat 99% on room air.  Sodium 140, potassium 3.5, chloride 112, bicarb 23, BUN 2, creatinine 0.7, glucose 86, and calcium 8.5.  White blood cells 4.1, hemoglobin 10.4, hematocrit 32.6, MCV 92.1, and platelet count 145.  TSH 2.537. Peripheral smear unremarkable.  Folate 20, vitamin B12 of 928.     Deatra Robinson, MD   ______________________________ Lacretia Leigh. Ninetta Lights, M.D.    NK/MEDQ  D:  06/20/2010  T:  06/21/2010  Job:  [id number]  cc:   Tilford Pillar, MD Mliss Sax, MD [facility]  Electronically Signed by Deatra Robinson  on 06/21/2010 11:45:36 AM Electronically Signed by Johny Sax M.D. on 07/07/2010 04:28:43 PM

## 2010-07-11 ENCOUNTER — Ambulatory Visit (HOSPITAL_COMMUNITY)
Admission: RE | Admit: 2010-07-11 | Discharge: 2010-07-11 | Disposition: A | Discharge status: Home / Self Care | Payer: Medicare Other | Source: Ambulatory Visit | Attending: Cardiology | Admitting: Cardiology

## 2010-07-11 ENCOUNTER — Ambulatory Visit (INDEPENDENT_AMBULATORY_CARE_PROVIDER_SITE_OTHER): Payer: Medicare Other | Admitting: Internal Medicine

## 2010-07-11 ENCOUNTER — Encounter: Payer: Self-pay | Admitting: Internal Medicine

## 2010-07-11 VITALS — BP 105/68 | HR 79 | Temp 96.7°F | Ht 65.0 in | Wt 96.4 lb

## 2010-07-11 DIAGNOSIS — F17200 Nicotine dependence, unspecified, uncomplicated: Secondary | ICD-10-CM

## 2010-07-11 DIAGNOSIS — F4321 Adjustment disorder with depressed mood: Secondary | ICD-10-CM

## 2010-07-11 DIAGNOSIS — K5090 Crohn's disease, unspecified, without complications: Secondary | ICD-10-CM

## 2010-07-11 DIAGNOSIS — I252 Old myocardial infarction: Secondary | ICD-10-CM | POA: Insufficient documentation

## 2010-07-11 DIAGNOSIS — I4460 Unspecified fascicular block: Secondary | ICD-10-CM | POA: Insufficient documentation

## 2010-07-11 DIAGNOSIS — E46 Unspecified protein-calorie malnutrition: Secondary | ICD-10-CM

## 2010-07-11 LAB — COMPREHENSIVE METABOLIC PANEL
ALT: 11 U/L (ref 0–35)
Alkaline Phosphatase: 117 U/L (ref 39–117)
CO2: 24 mEq/L (ref 19–32)
Creat: 0.76 mg/dL (ref 0.40–1.20)
Glucose, Bld: 80 mg/dL (ref 70–99)
Sodium: 136 mEq/L (ref 135–145)
Total Bilirubin: 0.4 mg/dL (ref 0.3–1.2)

## 2010-07-11 LAB — CBC
HCT: 46 % (ref 36.0–46.0)
MCH: 29.4 pg (ref 26.0–34.0)
MCV: 92.7 fL (ref 78.0–100.0)
RBC: 4.96 MIL/uL (ref 3.87–5.11)
WBC: 6.1 10*3/uL (ref 4.0–10.5)

## 2010-07-11 MED ORDER — BUPROPION HCL ER (SR) 150 MG PO TB12: 150.0000 mg | ORAL_TABLET | Freq: Two times a day (BID) | ORAL | Status: DC

## 2010-07-11 MED ORDER — METOPROLOL SUCCINATE ER 25 MG PO TB24: 25.0000 mg | ORAL_TABLET | Freq: Three times a day (TID) | ORAL | Status: DC

## 2010-07-11 MED ORDER — ALENDRONATE SODIUM 70 MG PO TABS: 70.0000 mg | ORAL_TABLET | ORAL | Status: DC

## 2010-07-11 MED ORDER — OMEPRAZOLE 40 MG PO CPDR: 40.0000 mg | DELAYED_RELEASE_CAPSULE | Freq: Every day | ORAL | Status: DC

## 2010-07-11 MED ORDER — ZOLPIDEM TARTRATE 5 MG PO TABS: 5.0000 mg | ORAL_TABLET | Freq: Every evening | ORAL | Status: DC | PRN

## 2010-07-11 NOTE — Assessment & Plan Note (Signed)
Patient was just recently discharged from the hospital June 20, 2010, she was hospitalized for abdominal pain with nausea and vomiting and it was determined to be secondary to gastritis or gastroparesis. Today she has no pain and denies nausea and vomiting. We will repeat cmet to ensure that electrolytes are within normal limits. Patient still has issues with stoma and will follow up with her doctor on March 3 further evaluation and management.

## 2010-07-11 NOTE — Assessment & Plan Note (Signed)
Continuing to encourage tobacco cessation, she reports cutting down from 2 packs per day to one pack per day and reports doing well on Wellbutrin. She reports tolerating Wellbutrin well, has no known side effects of the medication. We will continue the same regimen and will continue to encourage tobacco cessation.

## 2010-07-11 NOTE — Patient Instructions (Addendum)
Please schedule an appointment in 3 months. If you experience stomach pain associated with nausea vomiting and your symptoms are getting worse and you also experience fevers and chills please call us back at [phone] or go to emergency department.  Your medication Effexor was temporarily discontinued due to abnormalities on EKG which we have discussed today.You have an appointment with Dr. Clide Cliff at [facility] cardiology on following date:  Appointment details:  Date: March 22nd, 2012 Time: 10 am Address: [address] Phone: [phone]

## 2010-07-11 NOTE — Assessment & Plan Note (Signed)
Well-controlled on current medication Wellbutrin. Patient was on Effexor in the past however during the past hospitalization prolongation of QT interval was noted on EKG. For that reason Dr. Harriett Sine decided to discontinue Effexor and today we have scheduled an appointment with cardiologist Dr. Clide Cliff. Patient's appointment is scheduled for August 04, 2003 at 10 AM. We will check a 12-lead EKG to ensure that there are no new changes and will follow up on cardiologist's recommendations.

## 2010-07-11 NOTE — Progress Notes (Signed)
  Subjective:    Patient ID: Misty [name], female    DOB: 11/04/1950, 60 y.o.   MRN: [id number]  HPI  patient is 60 year old female with past medical history outlined below who presents to clinic for regular follow up appointment after her recent hospitalization.  She was hospitalized due to severe abdominal pain associated with nausea and vomiting, and this was determined to be most likely secondary to gastritis or gastroparesis. She stayed in hospital for 3 days and was discharged June 20, 2010. Since her discharge, she denies episodes of abdominal pain, nausea, vomiting, fevers or chills, no urinary concerns, no blood in urine or stool. She explains that she usually skips breakfast and tries to eat lunch and dinner. This helps control some of the pain better, it feels to her that when she eats more and there is more pain. She denies systemic symptoms off night sweats but does mention continuous weight loss due to her poor oral intake. She reports she continues to smoke one pack per day but does mention that she has cut down from 2 packs per day to one pack per day. Otherwise she reports feeling well,  Not quite at her baseline yet but feels like she is getting there. No episodes of chest pain, shortness of breath, palpitations, syncope, weakness, dizziness. She also reports compliance with recommended diet as well as compliance with medications. Denies drug or alcohol use.   Review of Systems  Constitutional: Negative.   HENT: Negative.   Eyes: Negative.   Respiratory: Negative.   Cardiovascular: Negative.   Genitourinary: Negative.   Musculoskeletal: Positive for back pain.  Neurological: Negative.   Psychiatric/Behavioral: Negative.        Objective:   Physical Exam  Constitutional: Vital signs are normal. She appears cachectic. She is cooperative. She is easily aroused.  Non-toxic appearance. She does not have a sickly appearance. She does not appear ill. No distress.  HENT:    Right Ear: External ear normal.  Left Ear: External ear normal.  Nose: Nose normal.  Mouth/Throat: Oropharynx is clear and moist. No oropharyngeal exudate.  Eyes: Conjunctivae and EOM are normal. Pupils are equal, round, and reactive to light. Right eye exhibits no discharge. Left eye exhibits no discharge. No scleral icterus.  Neck: Normal range of motion. No thyromegaly present.  Cardiovascular: Normal rate, regular rhythm, normal heart sounds and intact distal pulses.  Exam reveals no gallop and no friction rub.   No murmur heard. Pulmonary/Chest: Effort normal and breath sounds normal. No respiratory distress. She has no wheezes. She has no rales. She exhibits no tenderness.  Abdominal: Soft. She exhibits no shifting dullness, no distension, no pulsatile liver, no fluid wave, no abdominal bruit and no ascites. Bowel sounds are decreased. There is no splenomegaly or hepatomegaly. There is no tenderness. There is no rigidity, no guarding and no CVA tenderness.         Colostomy in place, no signs of infection or erythema, no swelling or discharge  Musculoskeletal: Normal range of motion. She exhibits no edema and no tenderness.  Neurological: She is alert and easily aroused.  Skin: Skin is warm and dry. No rash noted. No erythema. No pallor.  Psychiatric: She has a normal mood and affect. Her behavior is normal. Judgment and thought content normal.          Assessment & Plan:

## 2010-07-11 NOTE — Assessment & Plan Note (Signed)
Today her weight is 96 pounds. I have discussed this case with Dr. Coralee Pesa and we have encouraged high protein diet. Patient verbalized understanding and agreed to try.

## 2010-07-12 MED ORDER — METOPROLOL SUCCINATE ER 25 MG PO TB24: ORAL_TABLET | ORAL | Status: DC

## 2010-07-12 NOTE — Assessment & Plan Note (Signed)
Summary: 2/17 per call from [facility] with the Outpatient clinic [facility] n...   Referring Provider:  Moishe Spice Primary Provider:  Dr Coralee Pesa  CC:  referal from Dr. Coralee Pesa.  History of Present Illness: 60 yo seen by Dr Tenny Craw and Graciela Husbands in the hospital 12/11.  She has [place] and psychiatric disease.  She was hospitalized with nausea and vohmiting with ? pneumonia.  At the time she was on trazadone celexa and compozine.  QT 454.  Post op ileostomy was subsequently uneventful and patient had F/U normal myovue and echo.  Continue to smoke daily.  Counseled for less than 10 minutes.  No SSCP, palpitaitons or syncope.  Compliant with beta blocker.  Anxiety meds adjusted.  She still has poor nutrition but denies SSCP, edema and has mild dyspnea from COPD.  Reviewed hospital records from 12/11 Echo  Myovue  Current Problems (verified): 1)  Ventricular Tachycardia  (ICD-427.1) 2)  Thrombocytopenia  (ICD-287.5) 3)  Unspecified Cardiac Dysrhythmia  (ICD-427.9) 4)  Osteopenia  (ICD-733.90) 5)  Hypotension  (ICD-458.9) 6)  Dysuria  (ICD-788.1) 7)  Abscess, Tooth  (ICD-522.5) 8)  Malnutrition  (ICD-263.9) 9)  Tobacco Abuse  (ICD-305.1) 10)  Cataracts, Bilateral  (ICD-366.9) 11)  Depression, Situational  (ICD-309.0) 12)  Nausea and Vomiting  (ICD-787.01) 13)  Osteoporosis  (ICD-733.00) 14)  Abdominal Pain  (ICD-789.00) 15)  Uti  (ICD-599.0) 16)  Depression, Hx of  (ICD-V11.8) 17)  Crohn's Disease  (ICD-555.9)  Current Medications (verified): 1)  Fosamax 70 Mg Tabs (Alendronate Sodium) .... Take One Tab Once A Week 2)  Bupropion Hcl 150 Mg Xr12h-Tab (Bupropion Hcl) .Marland Kitchen.. 1 Tab By Mouth Two Times A Day 3)  Omeprazole 40 Mg Cpdr (Omeprazole) .Marland Kitchen.. 1  Tab By Mouth Once Daily 4)  Venlafaxine Hcl 37.5 Mg Tabs (Venlafaxine Hcl) .... As Needed 5)  Zolpidem Tartrate 5 Mg Tabs (Zolpidem Tartrate) .... As Needed  Allergies (verified): 1)  ! Codeine  Past History:  Past Medical  History: Last updated: 05/04/2010 Crohn's disease, diagnosed 1980s status post multiple surgeries eventual total colectomy, ileostomy Anxiety Chronic headaches Depression Pneumonia Occasional UTI s/p surgery 04/13/10  Past Surgical History: Last updated: 01/31/2010 1980-colectomy with end ileostomy small bowel resection(s)  Family History: Last updated: 01/31/2010 Crohn's disease, brother  Social History: Last updated: 01/31/2010 single, lives with 18 birds, 3 dogs and 2 cats has a very supportive younger brother who also has Crohn's Current Smoker Alcohol use-no   Review of Systems       Denies fever, malais, weight loss, blurry vision, decreased visual acuity, cough, sputum,hemoptysis, pleuritic pain, palpitaitons, heartburn, abdominal pain, melena, lower extremity edema, claudication, or rash.   Vital Signs:  Patient profile:   60 year old female Height:      64 inches Weight:      96 pounds BMI:     16.54 Pulse rate:   82 / minute Resp:     14 per minute BP sitting:   90 / 64  (left arm)  Vitals Entered By: Kem Parkinson (July 07, 2010 3:39 PM)  Physical Exam  General:  Affect appropriate Healthy:  appears stated age HEENT: normal Neck supple with no adenopathy JVP normal no bruits no thyromegaly Lungs clear with no wheezing and good diaphragmatic motion Heart:  S1/S2 no murmur,rub, gallop or click PMI normal Abdomen: benighn, BS positve, no tenderness, no AAA no bruit.  No HSM or HJR Distal pulses intact with no bruits No edema Neuro non-focal Skin warm and  dry    Impression & Recommendations:  Problem # 1:  VENTRICULAR TACHYCARDIA (ICD-427.1) Nonrecurrent likely related to post op metabolic abnormalities and meds.  QT ok.  Any further F/U should be with Dr Graciela Husbands. Continue BB The following medications were removed from the medication list:    Metoprolol Tartrate 25 Mg Tabs (Metoprolol tartrate) ..... One half a tab every 8  hours  Problem # 2:  TOBACCO ABUSE (ICD-305.1) Counseled less than 10 minutes  Not much motivation to quit  Problem # 3:  DEPRESSION, SITUATIONAL (ICD-309.0) Avoid QT prolonging drugs and polypharmacy.  Avoid compazine

## 2010-07-12 NOTE — Progress Notes (Signed)
Addended byMliss Sax on: 07/12/2010 01:38 PM   Modules accepted: Orders

## 2010-07-15 ENCOUNTER — Encounter (INDEPENDENT_AMBULATORY_CARE_PROVIDER_SITE_OTHER): Payer: Self-pay | Admitting: *Deleted

## 2010-07-21 NOTE — Telephone Encounter (Signed)
A user error has taken place: encounter opened in error, closed for administrative reasons.

## 2010-07-21 NOTE — Letter (Signed)
Summary: Appointment - Reschedule  [facility], Main Office  [address]   [place], [place] [address]   Phone: [phone]  Fax: [phone]     July 15, 2010 MRN: [id number]   [facility] [place] [address] [place], [place]  [address]   Dear Ms. HALLS,   Due to a change in our office schedule, your appointment on 08-04-2010                      at    10:10 a.m.            must be changed.  It is very important that we reach you to reschedule this appointment. We look forward to participating in your health care needs. Please contact us at the number listed above at your earliest convenience to reschedule this appointment.     Sincerely,     Lorne Skeens  [facility] Scheduling Team

## 2010-07-22 ENCOUNTER — Encounter: Payer: Self-pay | Admitting: Internal Medicine

## 2010-07-25 LAB — BASIC METABOLIC PANEL
BUN: 5 mg/dL — ABNORMAL LOW (ref 6–23)
BUN: 5 mg/dL — ABNORMAL LOW (ref 6–23)
CO2: 24 mEq/L (ref 19–32)
CO2: 27 mEq/L (ref 19–32)
CO2: 28 mEq/L (ref 19–32)
CO2: 29 mEq/L (ref 19–32)
Calcium: 7.5 mg/dL — ABNORMAL LOW (ref 8.4–10.5)
Calcium: 7.9 mg/dL — ABNORMAL LOW (ref 8.4–10.5)
Calcium: 8 mg/dL — ABNORMAL LOW (ref 8.4–10.5)
Calcium: 8.3 mg/dL — ABNORMAL LOW (ref 8.4–10.5)
Calcium: 8.3 mg/dL — ABNORMAL LOW (ref 8.4–10.5)
Chloride: 102 mEq/L (ref 96–112)
Chloride: 105 mEq/L (ref 96–112)
Chloride: 110 mEq/L (ref 96–112)
Chloride: 93 mEq/L — ABNORMAL LOW (ref 96–112)
Creatinine, Ser: 0.57 mg/dL (ref 0.4–1.2)
Creatinine, Ser: 0.59 mg/dL (ref 0.4–1.2)
Creatinine, Ser: 0.71 mg/dL (ref 0.4–1.2)
Creatinine, Ser: 0.73 mg/dL (ref 0.4–1.2)
GFR calc Af Amer: >60 mL/min (ref >60)
GFR calc Af Amer: >60 mL/min (ref >60)
GFR calc Af Amer: >60 mL/min (ref >60)
GFR calc Af Amer: >60 mL/min (ref >60)
GFR calc Af Amer: >60 mL/min (ref >60)
GFR calc Af Amer: >60 mL/min (ref >60)
GFR calc non Af Amer: >60 mL/min (ref >60)
GFR calc non Af Amer: >60 mL/min (ref >60)
GFR calc non Af Amer: >60 mL/min (ref >60)
GFR calc non Af Amer: >60 mL/min (ref >60)
Glucose, Bld: 106 mg/dL — ABNORMAL HIGH (ref 70–99)
Glucose, Bld: 95 mg/dL (ref 70–99)
Glucose, Bld: 99 mg/dL (ref 70–99)
Potassium: 2.9 mEq/L — ABNORMAL LOW (ref 3.5–5.1)
Potassium: 3.1 mEq/L — ABNORMAL LOW (ref 3.5–5.1)
Potassium: 3.3 mEq/L — ABNORMAL LOW (ref 3.5–5.1)
Potassium: 3.6 mEq/L (ref 3.5–5.1)
Potassium: 4.2 mEq/L (ref 3.5–5.1)
Sodium: 136 mEq/L (ref 135–145)
Sodium: 138 mEq/L (ref 135–145)
Sodium: 138 mEq/L (ref 135–145)
Sodium: 139 mEq/L (ref 135–145)

## 2010-07-25 LAB — MAGNESIUM
Magnesium: 1.7 mg/dL (ref 1.5–2.5)
Magnesium: 2 mg/dL (ref 1.5–2.5)
Magnesium: 2.4 mg/dL (ref 1.5–2.5)

## 2010-07-25 LAB — CBC
HCT: 33.1 % — ABNORMAL LOW (ref 36.0–46.0)
HCT: 33.9 % — ABNORMAL LOW (ref 36.0–46.0)
HCT: 34.2 % — ABNORMAL LOW (ref 36.0–46.0)
HCT: 36.1 % (ref 36.0–46.0)
HCT: 37.9 % (ref 36.0–46.0)
Hemoglobin: 10.9 g/dL — ABNORMAL LOW (ref 12.0–15.0)
Hemoglobin: 11 g/dL — ABNORMAL LOW (ref 12.0–15.0)
MCH: 29.8 pg (ref 26.0–34.0)
MCH: 30 pg (ref 26.0–34.0)
MCHC: 31 g/dL (ref 30.0–36.0)
MCHC: 31.4 g/dL (ref 30.0–36.0)
MCHC: 31.9 g/dL (ref 30.0–36.0)
MCHC: 32.9 g/dL (ref 30.0–36.0)
MCV: 95 fL (ref 78.0–100.0)
MCV: 95 fL (ref 78.0–100.0)
MCV: 96.8 fL (ref 78.0–100.0)
Platelets: 150 10*3/uL (ref 150–400)
Platelets: 174 10*3/uL (ref 150–400)
RBC: 3.4 MIL/uL — ABNORMAL LOW (ref 3.87–5.11)
RBC: 3.62 MIL/uL — ABNORMAL LOW (ref 3.87–5.11)
RDW: 13.9 % (ref 11.5–15.5)
RDW: 14 % (ref 11.5–15.5)
RDW: 14 % (ref 11.5–15.5)
RDW: 14.1 % (ref 11.5–15.5)
RDW: 14.3 % (ref 11.5–15.5)
WBC: 5.1 10*3/uL (ref 4.0–10.5)
WBC: 5.7 10*3/uL (ref 4.0–10.5)
WBC: 5.7 10*3/uL (ref 4.0–10.5)

## 2010-07-25 LAB — CARDIAC PANEL(CRET KIN+CKTOT+MB+TROPI)
CK, MB: 3.4 ng/mL (ref 0.3–4.0)
CK, MB: 3.8 ng/mL (ref 0.3–4.0)
CK, MB: 7.4 ng/mL (ref 0.3–4.0)
Relative Index: 0.6 (ref 0.0–2.5)
Total CK: 327 U/L — ABNORMAL HIGH (ref 7–177)
Total CK: 510 U/L — ABNORMAL HIGH (ref 7–177)
Total CK: 557 U/L — ABNORMAL HIGH (ref 7–177)
Total CK: 645 U/L — ABNORMAL HIGH (ref 7–177)
Troponin I: 0.08 ng/mL — ABNORMAL HIGH (ref 0.00–0.06)
Troponin I: 0.22 ng/mL — ABNORMAL HIGH (ref 0.00–0.06)
Troponin I: 0.35 ng/mL — ABNORMAL HIGH (ref 0.00–0.06)

## 2010-07-25 LAB — HEPARIN INDUCED THROMBOCYTOPENIA PNL
Patient O.D.: 0.628
UFH High Dose UFH H: 43 % Release
UFH Low Dose 0.1 IU/mL: 43 % Release

## 2010-07-25 LAB — APTT: aPTT: 39 seconds — ABNORMAL HIGH (ref 24–37)

## 2010-07-25 LAB — BRAIN NATRIURETIC PEPTIDE: Pro B Natriuretic peptide (BNP): 600 pg/mL — ABNORMAL HIGH (ref 0.0–100.0)

## 2010-07-26 LAB — DIFFERENTIAL
Basophils Absolute: 0.1 10*3/uL (ref 0.0–0.1)
Basophils Relative: 1 % (ref 0–1)
Eosinophils Absolute: 0.1 10*3/uL (ref 0.0–0.7)
Monocytes Relative: 9 % (ref 3–12)
Neutrophils Relative %: 60 % (ref 43–77)

## 2010-07-26 LAB — COMPREHENSIVE METABOLIC PANEL
ALT: 8 U/L (ref 0–35)
Alkaline Phosphatase: 91 U/L (ref 39–117)
CO2: 28 mEq/L (ref 19–32)
Chloride: 100 mEq/L (ref 96–112)
GFR calc non Af Amer: >60 mL/min (ref >60)
Glucose, Bld: 86 mg/dL (ref 70–99)
Potassium: 3.5 mEq/L (ref 3.5–5.1)
Sodium: 135 mEq/L (ref 135–145)
Total Bilirubin: 0.3 mg/dL (ref 0.3–1.2)

## 2010-07-26 LAB — CBC
HCT: 42.7 % (ref 36.0–46.0)
Hemoglobin: 14 g/dL (ref 12.0–15.0)
MCV: 93.8 fL (ref 78.0–100.0)
WBC: 5.7 10*3/uL (ref 4.0–10.5)

## 2010-07-26 LAB — SURGICAL PCR SCREEN
MRSA, PCR: NEGATIVE
Staphylococcus aureus: NEGATIVE

## 2010-07-27 LAB — URINE MICROSCOPIC-ADD ON

## 2010-07-27 LAB — DIFFERENTIAL
Basophils Absolute: 0 10*3/uL (ref 0.0–0.1)
Lymphocytes Relative: 18 % (ref 12–46)
Neutro Abs: 7.9 10*3/uL — ABNORMAL HIGH (ref 1.7–7.7)

## 2010-07-27 LAB — URINALYSIS, ROUTINE W REFLEX MICROSCOPIC
Nitrite: NEGATIVE
Specific Gravity, Urine: 1.025 (ref 1.005–1.030)
pH: 6 (ref 5.0–8.0)

## 2010-07-27 LAB — COMPREHENSIVE METABOLIC PANEL
Albumin: 4.3 g/dL (ref 3.5–5.2)
BUN: 24 mg/dL — ABNORMAL HIGH (ref 6–23)
Chloride: 100 mEq/L (ref 96–112)
Creatinine, Ser: 1.19 mg/dL (ref 0.4–1.2)
GFR calc non Af Amer: 47 mL/min — ABNORMAL LOW (ref >60)
Total Bilirubin: 0.8 mg/dL (ref 0.3–1.2)

## 2010-07-27 LAB — CBC
MCH: 30.3 pg (ref 26.0–34.0)
MCV: 88.8 fL (ref 78.0–100.0)
Platelets: 285 10*3/uL (ref 150–400)
RBC: 5.47 MIL/uL — ABNORMAL HIGH (ref 3.87–5.11)

## 2010-07-27 LAB — URINE CULTURE: Colony Count: >=100000

## 2010-08-02 LAB — CBC
HCT: 35.9 % — ABNORMAL LOW (ref 36.0–46.0)
HCT: 37.4 % (ref 36.0–46.0)
Hemoglobin: 12.3 g/dL (ref 12.0–15.0)
Hemoglobin: 13.1 g/dL (ref 12.0–15.0)
MCHC: 34.2 g/dL (ref 30.0–36.0)
MCHC: 34.8 g/dL (ref 30.0–36.0)
MCHC: 35.1 g/dL (ref 30.0–36.0)
MCV: 94.4 fL (ref 78.0–100.0)
Platelets: 177 10*3/uL (ref 150–400)
RBC: 3.96 MIL/uL (ref 3.87–5.11)
RBC: 4.32 MIL/uL (ref 3.87–5.11)
RDW: 12.7 % (ref 11.5–15.5)
RDW: 13 % (ref 11.5–15.5)
WBC: 6.7 10*3/uL (ref 4.0–10.5)

## 2010-08-02 LAB — BASIC METABOLIC PANEL
BUN: <1 mg/dL — ABNORMAL LOW (ref 6–23)
CO2: 25 mEq/L (ref 19–32)
CO2: 26 mEq/L (ref 19–32)
Calcium: 8 mg/dL — ABNORMAL LOW (ref 8.4–10.5)
Calcium: 8.5 mg/dL (ref 8.4–10.5)
Chloride: 106 mEq/L (ref 96–112)
GFR calc Af Amer: >60 mL/min (ref >60)
GFR calc non Af Amer: >60 mL/min (ref >60)
Glucose, Bld: 118 mg/dL — ABNORMAL HIGH (ref 70–99)
Glucose, Bld: 82 mg/dL (ref 70–99)
Potassium: 2.9 mEq/L — ABNORMAL LOW (ref 3.5–5.1)
Sodium: 140 mEq/L (ref 135–145)

## 2010-08-02 LAB — COMPREHENSIVE METABOLIC PANEL
ALT: 31 U/L (ref 0–35)
AST: 34 U/L (ref 0–37)
Albumin: 3.2 g/dL — ABNORMAL LOW (ref 3.5–5.2)
CO2: 26 mEq/L (ref 19–32)
Calcium: 8.5 mg/dL (ref 8.4–10.5)
Chloride: 108 mEq/L (ref 96–112)
GFR calc Af Amer: >60 mL/min (ref >60)
GFR calc non Af Amer: >60 mL/min (ref >60)
Sodium: 139 mEq/L (ref 135–145)

## 2010-08-02 LAB — SEDIMENTATION RATE: Sed Rate: 11 mm/hr (ref 0–22)

## 2010-08-02 LAB — URINE CULTURE: Colony Count: 8000

## 2010-08-02 LAB — DIFFERENTIAL
Eosinophils Absolute: 0.1 10*3/uL (ref 0.0–0.7)
Eosinophils Relative: 2 % (ref 0–5)
Lymphs Abs: 1.7 10*3/uL (ref 0.7–4.0)
Monocytes Absolute: 0.6 10*3/uL (ref 0.1–1.0)

## 2010-08-02 LAB — URINALYSIS, ROUTINE W REFLEX MICROSCOPIC
Bilirubin Urine: NEGATIVE
Glucose, UA: NEGATIVE mg/dL
Ketones, ur: NEGATIVE mg/dL
Protein, ur: NEGATIVE mg/dL

## 2010-08-02 LAB — CULTURE, BLOOD (ROUTINE X 2)
Culture: NO GROWTH
Culture: NO GROWTH

## 2010-08-02 LAB — URINE MICROSCOPIC-ADD ON

## 2010-08-04 ENCOUNTER — Ambulatory Visit (INDEPENDENT_AMBULATORY_CARE_PROVIDER_SITE_OTHER): Payer: Medicare Other | Admitting: Internal Medicine

## 2010-08-04 ENCOUNTER — Telehealth: Payer: Self-pay | Admitting: *Deleted

## 2010-08-04 ENCOUNTER — Encounter: Payer: Self-pay | Admitting: Internal Medicine

## 2010-08-04 VITALS — BP 84/58 | HR 55 | Ht 65.0 in | Wt 97.0 lb

## 2010-08-04 DIAGNOSIS — I472 Ventricular tachycardia: Secondary | ICD-10-CM

## 2010-08-04 NOTE — Progress Notes (Signed)
HPI: Misty [name] is seen at the request of Dr. Eden Emms and Dr. Aldine Contes because of a history of polymorphic ventricular tachycardia occurring in the context of the hospitalization December 2001. The patient had been admitted for revision of ileostomy related to her Crohn's disease. She also has a history of psychiatric disease for which she is taking multiple antipsychotics and tranquilizers.  Electrocardiograms were reviewed from this hospitalization and a significant marching of her QT C. Interval from normal to 600 ms and insufflated back to normal was identified. Strips for polymorphic ventricular tachycardia were not available.  Cardiac evaluation included an ultrasound that was normal and a Myoview as an outpatient that was also normal.   There has been no prior history of syncope either before or after this event. There is no family history of sudden death, crib death, or syncope.  Current Outpatient Prescriptions  Medication Sig Dispense Refill  . alendronate (FOSAMAX) 70 MG tablet Take 1 tablet (70 mg total) by mouth every 7 (seven) days. Take in the morning with a full glass of water, on an empty stomach, and do not take anything else by mouth or lie down for the next 30 min.  4 tablet  7  . aspirin 325 MG tablet Take 325 mg by mouth every 6 (six) hours as needed.        Marland Kitchen buPROPion (WELLBUTRIN SR) 150 MG 12 hr tablet Take 1 tablet (150 mg total) by mouth 2 (two) times daily.  60 tablet  7  . metoprolol succinate (TOPROL-XL) 25 MG 24 hr tablet Take 1/2 tablet every 8 hours  45 tablet  5  . omeprazole (PRILOSEC) 40 MG capsule Take 1 capsule (40 mg total) by mouth daily.  30 capsule  5  . oxyCODONE-acetaminophen (PERCOCET) 5-325 MG per tablet       . promethazine (PHENERGAN) 25 MG tablet       . zolpidem (AMBIEN) 5 MG tablet Take 1 tablet (5 mg total) by mouth at bedtime as needed.  30 tablet  3    Allergies  Allergen Reactions  . Codeine     Past Medical History  Diagnosis Date  .  Crohn's disease 1980's     diagnosis made in 1980s, status post multiple surgeries including ileostomy and total colectomy; November 29 2009 single contrast ileostomy enema performed and normal ileostomy study following total colectomy was noted with no evidence of Crohn's dz;  Marland Kitchen Depression   . Substance abuse     tobacco  . Insomnia   . 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 Colectomy 1980's     status post colectomy with right lower quadrant ileostomy, this was performed in 1980s and we have no details of the procedures,  however this was confirmed by multiple CTs of the abdomen and pelvis, last CT of the abdomen and pelvis June 18, 2010 showed no evidence of active Crohn's disease or complications  . Laparoscopy 04/13/2010     diagnostic laparoscopy, lysis of adhesions and small bowel resection with ileostomy revision, done by Dr. Dwain Sarna;  postoperative diagnoses =  difficulty with ileostomy and ileostomy stricture,  pathology report April 13, 2010 -->  ileostomy with focal ulceration, granulation tissue and fibrosis    Family History  Problem Relation Age of Onset  . Crohn's disease Brother     History   Social History  . Marital Status: Divorced    Spouse Name:  N/A    Number of Children: N/A  . Years of Education: N/A   Occupational History  . Not on file.   Social History Main Topics  . Smoking status: Current Everyday Smoker -- 1.0 packs/day for 30 years    Types: Cigarettes  . Smokeless tobacco: Never Used  . Alcohol Use: No  . Drug Use: No  . Sexually Active: Not on file   Other Topics Concern  . Not on file   Social History Narrative  . No narrative on file    Fourteen point review of systems was negative except as noted in HPI and PMH   PHYSICAL EXAMINATION  Blood pressure 84/58, pulse 55, height 5\' 5"  (1.651 m), weight 97 lb (43.999 kg).   Well developed and nourished in  no acute distress HENT normal Neck supple with JVP-flat Carotids brisk and full without bruits Back without scoliosis or kyphosis Clear Regular rate and rhythm, no murmurs or gallops Abd-soft with active BS without hepatomegaly or midline pulsation Femoral pulses 2+ distal pulses intact No Clubbing cyanosis edema Skin-warm and dry LN-neg submandibular and supraclavicular A & Oriented CN 3-12 normal  Grossly normal sensory and motor function Affect engaging .

## 2010-08-04 NOTE — Telephone Encounter (Signed)
PT AWARE TO STOP PHENEGRAN PER DR Graciela Husbands

## 2010-08-04 NOTE — Patient Instructions (Signed)
Your physician recommends that you schedule a follow-up appointment in:6 MONTHS WITH DR Graciela Husbands  [facility] SITE  [email or web address]

## 2010-08-04 NOTE — Assessment & Plan Note (Signed)
The patient has a history of polymorphic ventricular tachycardia occurred in the context of QT prolongation. We have to assume that the patient is a forme fruste long QT syndrome patient and the drug exposure unmasked repolarization reserve deficits. She needs not to be exposed to QT prolonging agents either over-the-counter or prescribed drugs. We have given her the website QT [email or web address] as a checklist against which to compare any prescribed or over-the-counter medications.  Genetic screening at this point is not an option for her financially.

## 2010-08-08 ENCOUNTER — Encounter: Payer: Self-pay | Admitting: Internal Medicine

## 2010-08-14 ENCOUNTER — Other Ambulatory Visit: Payer: Self-pay | Admitting: Internal Medicine

## 2010-09-08 NOTE — Discharge Summary (Signed)
NAME:  GENEVER, HENTGES              ACCOUNT NO.:  [id number]  MEDICAL RECORD NO.:  [id number]           PATIENT TYPE:  LOCATION:                                 FACILITY:  PHYSICIAN:  Juanetta Gosling, MDDATE OF BIRTH:  1950-12-25  DATE OF ADMISSION:  04/13/2010 DATE OF DISCHARGE:  04/19/2010                              DISCHARGE SUMMARY   ADMISSION DIAGNOSES: 1. Crohn disease. 2. Status post total colectomy with an ileostomy. 3. 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  PROCEDURES PERFORMED:  April 13, 2010, diagnostic laparoscopy, lysis of adhesions time 1 hour with small bowel resection and ileostomy revision.  HISTORY AND HOSPITAL COURSE:  Misty [name] is a 60 year old female who was a patient of Dr. Jamey Ripa for about 30 years.  She had a history of total abdominal colectomy in the past and an ileostomy.  Over the last decade, she has had some difficulty with her Crohn's as well as her ileostomy.  She has taken prednisone in the past that she has taken without being evaluated by a gastroenterologist or a surgeon.  Dr. Jamey Ripa asked me to see her to see if this could be done with a laparoscope.  I had her seen Gastroenterology with Dr. Wendall Papa.  She underwent a flexible sigmoidoscopy through ileostomy which showed that distal loop is 5-6 cm and the terminal ileum mildly inflamed and ulcerated with a focal fibrotic-appearing stricture 5 cm proximal to the ostomy that could not pass a 9-mm adult gastroscope but did pass a pediatric scope.  Biopsies were essentially normal.  She came in along with Dr. Jamey Ripa.  We discussed laparoscopic possible open revision of her ileostomy.  We did this on April 13, 2010, and ended up needing to open her locally and resected a portion of her small bowel and brought up her new ileostomy.  She had very dense adhesions in her pelvis and we elected to just repair this locally for right now and hopefully we would have good results  from that.  Postoperatively, she had a lot of difficulty with pain control as well as her ileostomy began to be retracted as well.  She had several runs of asymptomatic ventricular tachycardia which resolved over some time and she was seen by Critical Care Medicine doctors as well as Cardiology for that.  She slowly began to improve with her pain control.  She began having output from her ileostomy although she had a significant degree of pain around her ileostomy at that point.  Eventually, she was able to be transitioned to clears.  Her cardiac issues had resolved.  We were in the process of doing some wound care around there as well and we had discussed revising the ileostomy again at some point.  Both of [place] were in agreement that we would wait some time to look at the final results of the revision that we had done this time.  I did remove some of her staples prior to this and eventually she was thought to be smoking in her room and she was mad and left against medical advice.  Her followup was with Dr. Jamey Ripa a  couple of weeks after her discharge.  Her medications upon discharge, she would not have her medication form filled out due to the fact that she left AMA and did not relieve with any instructions except for to come back and see [place] at [facility] in 2 weeks as well as to see [facility] as well.  Her pathology report showed her to have an ileostomy with focal ulceration, granulation tissue and fibrosis.     Juanetta Gosling, MD     MCW/MEDQ  D:  09/06/2010  T:  09/06/2010  Job:  [id number]  Electronically Signed by Emelia Loron MD on 09/08/2010 07:46:07 AM

## 2010-09-13 ENCOUNTER — Telehealth: Payer: Self-pay | Admitting: *Deleted

## 2010-09-13 NOTE — Telephone Encounter (Signed)
Pt calls states she thought she was being referred to a pain clinic but has not heard anything, spoke w/ kayeg. And she will look into the referral, pt is told she will get a call back in 1-2 days. She is agreeable

## 2010-09-14 NOTE — Telephone Encounter (Signed)
Received message from triage nurse that pt had called inquiring about her pain clinic referral.  Referral was faxed 08/22/10.  I contacted [facility] at [phone] and they stated they had faxed a statement saying "Pt is not appropriate for their office"  Attempted to contact pt back at number on chart but it rings 2-3 times and then clicks over to busy..Will attempt to send pt to another pain clinic.

## 2010-09-22 NOTE — Telephone Encounter (Signed)
Message left on pt's recorder that [facility] chosen not to accept her as a patient and informed her that a new referral will have to be made elsewhere.  Message also left asking her if she would be able to travel to [facility] or [place].

## 2010-09-30 NOTE — Discharge Summary (Signed)
NAME:  Misty [name], Misty [name]                          ACCOUNT NO.:  [id number]   MEDICAL RECORD NO.:  [id number]                   PATIENT TYPE:  INP   LOCATION:  5725                                 FACILITY:  [facility]   PHYSICIAN:  Ileana Roup, M.D.               DATE OF BIRTH:  [birth year]   DATE OF ADMISSION:  08/05/2003  DATE OF DISCHARGE:  08/07/2003                                 DISCHARGE SUMMARY   DISCHARGE DIAGNOSES:  1. Viral gastroenteritis.  2. Crohn's disease.  3. Tobacco abuse.  4. Questionable emphysema.   DISCHARGE MEDICATIONS:  1. Darvocet one p.o. q.4-6h. p.r.n.  2. Phenergan 1/2 to 1 tablet p.o. q.4-6h. p.r.n.  3. Nicotine patch one transdermal daily.   DISPOSITION:  Discharged to home.   FOLLOWUP:  In the Outpatient Clinic next week.  We will have to call her for  an appointment.   PROCEDURES:  1. Acute abdominal series showing one potential very mild ileus involving     some of the pelvic small bowel loops, right ileostomy, and emphysema.  2. A CT scan of the abdomen and pelvis showing no acute abnormalities in the     abdomen, no significant change since May 14, 2003, moderate aortic     atherosclerosis and findings consistent with a localized ileus involving     solitary loops of distal jejunum or proximal ileum.  No associated     inflammation suggesting _________.   CONSULTATIONS:  Dr. Russella Dar of [facility].   HISTORY OF PRESENT ILLNESS:  This is a 60 year old female with a history of  Crohn's disease with a four day history of nausea, vomiting, and diarrhea.  Her abdominal pain is cramping in nature.  She also has had decreased  appetite and decreased intake of fluids.  She has had no subjective fever  and chills, although cold sweats.  Cramping becomes better shortly after  vomiting, though drinking the contrast in the emergency department worsens  the cramping.   ALLERGIES:  No known drug allergies.   PAST MEDICAL HISTORY:  1.  Crohn's disease diagnosed at age 77.  2. Migraine headaches.   PAST SURGICAL HISTORY:  1. Ileostomy in 1982.  2. Revision of ileostomy in 1982.  3. Bowel resection in 1981.  4. Abscess in 1980.   MEDICATIONS:  1. Darvocet p.r.n.  2. Xanax p.r.n.  3. Phenergan p.r.n.   SOCIAL HISTORY:  Divorced female.  She is disabled.  She has Medicare.  She  smoked 2 packs per day for 36 years.  She denies alcohol or drug use.   FAMILY HISTORY:  Mother is alive at 64 with diverticulitis and cholesterol.  Father died at 4 as a result of lung cancer and heart attack.  She has one  sister who is healthy, two brothers;  one has Crohn's disease.   PHYSICAL EXAMINATION:  VITAL SIGNS:  Pulse 118, blood pressure 118/84,  temperature  99.6, respirations 18, saturating 98% on room air.  GENERAL:  She is in no acute distress.  She is alert and oriented.  HEENT:  Within normal limits.  LUNGS:  Clear to auscultation bilaterally.  CARDIOVASCULAR:  Regular rate and rhythm, no murmurs, rubs, or gallops are  noted, 2+ distal pulses.  GASTROINTESTINAL:  Belly is soft, diffusely tender to palpation, normoactive  bowel sounds, midline scar, no rebound, some voluntary guarding is noted.  EXTREMITIES:  Without cyanosis, clubbing, or edema.  SKIN:  No rashes.  MUSCULOSKELETAL:  Normal strength and tone.  NEUROLOGIC:  Cranial nerves II-XII intact.  Strength is 5/5 bilateral upper  and lower extremities, and 2+ DTRs.   LABORATORY DATA:  An UA was unremarkable.  Chemistries showed a sodium of  134, potassium 3.2, chloride 95, bicarbonate 28, BUN 10, creatinine 0.9,  glucose 109.  She has a CBC with a white count of 11.7, H&H of 16.8 and  29.5, platelets of 298.  Fecal occult blood negative.   HOSPITAL COURSE:  #1 -  NAUSEA AND VOMITING:  The patient had a  sedimentation rate done that was within normal limits at 13.  So it was felt  with this and the evidence found on her CT scan that this was not an active   flare of Crohn's disease.  The patient also described that her symptoms came  on rather suddenly, and this is not her usual Crohn's disease presentation.  She was hydrated, electrolytes replaced.  She was given antiemetics and pain  control with resolution of her symptoms at discharge.  #2 -  CROHN'S DISEASE:  Seems to be stable at this time.  She was briefly  started on Solu-Medrol for concerns of Crohn's flare, but this was stopped  when it was determined that this unlikely to be active inflammation.  #3 -  TOBACCO ABUSE:  She was started on a nicotine patch, is requesting  these for followup, and these will be provided hopefully through the  [facility].  #4 -  QUESTION OF EMPHYSEMA:  Seen on chest x-ray.  This should be followed  up by her primary care physician with pulmonary function tests and  appropriate medications as needed.      Misty Beath, MD                     Ileana Roup, M.D.    JT/MEDQ  D:  08/07/2003  T:  08/09/2003  Job:  [id number]

## 2010-11-01 ENCOUNTER — Telehealth: Payer: Self-pay | Admitting: *Deleted

## 2010-11-01 NOTE — Telephone Encounter (Signed)
Pt calls to state she is having pain "all over", cannot sleep or eat, has lost 4 more pounds and just cannot take much more, she is offered an appt 4/20 but states she has an appt at [facility] tomorrow and must keep that, she is given an appt for thurs 6/21 at 1015 and is agreeable

## 2010-11-01 NOTE — Telephone Encounter (Signed)
Thank you :)

## 2010-11-02 ENCOUNTER — Ambulatory Visit: Payer: Medicare Other | Admitting: Internal Medicine

## 2010-11-03 ENCOUNTER — Encounter: Payer: Self-pay | Admitting: Internal Medicine

## 2010-11-03 ENCOUNTER — Ambulatory Visit (INDEPENDENT_AMBULATORY_CARE_PROVIDER_SITE_OTHER): Payer: Medicare Other | Admitting: Internal Medicine

## 2010-11-03 DIAGNOSIS — F4321 Adjustment disorder with depressed mood: Secondary | ICD-10-CM

## 2010-11-03 DIAGNOSIS — E46 Unspecified protein-calorie malnutrition: Secondary | ICD-10-CM

## 2010-11-03 DIAGNOSIS — I4581 Long QT syndrome: Secondary | ICD-10-CM

## 2010-11-03 DIAGNOSIS — K5090 Crohn's disease, unspecified, without complications: Secondary | ICD-10-CM

## 2010-11-03 LAB — COMPREHENSIVE METABOLIC PANEL
ALT: 11 U/L (ref 0–35)
CO2: 24 mEq/L (ref 19–32)
Calcium: 10 mg/dL (ref 8.4–10.5)
Chloride: 106 mEq/L (ref 96–112)
Creat: 0.75 mg/dL (ref 0.50–1.10)
Glucose, Bld: 86 mg/dL (ref 70–99)

## 2010-11-03 MED ORDER — EMETROL 1.87-1.87-21.5 PO SOLN: 5.0000 mL | ORAL | Status: AC | PRN

## 2010-11-03 MED ORDER — BUPROPION HCL ER (SR) 150 MG PO TB12: 150.0000 mg | ORAL_TABLET | Freq: Two times a day (BID) | ORAL | Status: DC

## 2010-11-03 MED ORDER — TRAMADOL HCL 50 MG PO TABS: 50.0000 mg | ORAL_TABLET | Freq: Four times a day (QID) | ORAL | Status: DC | PRN

## 2010-11-03 NOTE — Patient Instructions (Signed)
You have an appointment with Dr Anselm Jungling on 11/15/2010 at 10:45 am.

## 2010-11-04 DIAGNOSIS — I4581 Long QT syndrome: Secondary | ICD-10-CM | POA: Insufficient documentation

## 2010-11-04 NOTE — Assessment & Plan Note (Addendum)
Patient's abdominal pain is a chronic condition. She was oriented although it is at a pain clinic at [facility]. She noted that oxycodone was working for her in the past but it is described by her Careers adviser. No pain medication was prescribed from this clinic. At this point patient was given tramadol for pain control and Emetrol solution for nausea. All other medication for nausea is contraindicated since patient was found to have prolonged QT syndrome. We'll touch base with down patient's surgeon to discuss about pain medication regimen.

## 2010-11-04 NOTE — Progress Notes (Signed)
  Subjective:    Patient ID: Misty [name], female    DOB: [birth year], 60 y.o.   MRN: [id number]  HPI This is a 60 year old female with past history significant for Crohn's disease with multiple surgeries, depression, chronic abdominal pain who presented with abdominal pain to the clinic. 1. Patient noted that she has lost 4 pounds recently due to persistent abdominal pain. She was seen at the pain clinic at [facility] wound noted that she is not a candidate for different methods of pain medication. She noted that the abdominal pain is persistent, no aggravating or alleviating factors, localized at the stoma area, associated with nausea and significant decrease of appetite. She denies any fevers or chills 2. Depression: Patient noted that she has not been taking Wellbutrin as prescribed. She can not give me an exact reason why she stopped taking it. She noted that her mood is very down and heat she is very anxious and crying all day long. She reports that even her animals( bird and dogs) don't like her since she is very emotional.    Review of Systems  Constitutional: Positive for activity change, appetite change, fatigue and unexpected weight change. Negative for fever and chills.  Respiratory: Negative for chest tightness and shortness of breath.   Cardiovascular: Negative for chest pain and palpitations.  Gastrointestinal: Positive for nausea, vomiting, abdominal pain and abdominal distention.  Genitourinary: Positive for difficulty urinating.  Musculoskeletal: Positive for back pain.  Psychiatric/Behavioral: Positive for decreased concentration.       Objective:   Physical Exam  Constitutional: She is oriented to person, place, and time. She appears cachectic.  HENT:  Head: Normocephalic.  Neck: Neck supple.  Cardiovascular: Normal rate and regular rhythm.   Pulmonary/Chest: Effort normal and breath sounds normal.  Abdominal: Soft. Bowel sounds are normal. She exhibits no  distension. There is tenderness. There is guarding. There is no rebound.    Musculoskeletal: Normal range of motion.  Neurological: She is alert and oriented to person, place, and time.  Psychiatric: Her speech is slurred. She is slowed. She exhibits a depressed mood.          Assessment & Plan:

## 2010-11-15 ENCOUNTER — Ambulatory Visit (INDEPENDENT_AMBULATORY_CARE_PROVIDER_SITE_OTHER): Payer: Medicare Other | Admitting: Internal Medicine

## 2010-11-15 ENCOUNTER — Encounter: Payer: Self-pay | Admitting: Internal Medicine

## 2010-11-15 DIAGNOSIS — F4321 Adjustment disorder with depressed mood: Secondary | ICD-10-CM

## 2010-11-15 DIAGNOSIS — K5090 Crohn's disease, unspecified, without complications: Secondary | ICD-10-CM

## 2010-11-15 MED ORDER — ZOLPIDEM TARTRATE 5 MG PO TABS: 5.0000 mg | ORAL_TABLET | Freq: Every evening | ORAL | Status: DC | PRN

## 2010-11-15 MED ORDER — GI COCKTAIL ~~LOC~~: 10.0000 mL | Freq: Three times a day (TID) | ORAL | Status: DC | PRN

## 2010-11-15 NOTE — Assessment & Plan Note (Signed)
Improving, she appears less anxious and less depressed.  She reports feeling much better. -Will continue Wellbutrin and avoid other SSRIs/benzos given her hx of prolonged QT interval.

## 2010-11-15 NOTE — Assessment & Plan Note (Addendum)
Abdominal pain is not well-controlled with Tramadol because she is only taking 1/2 tab because of the GI side effects.  OTC Emetrol is not working for her.   -Will continue Tramadol as prescribed and Dr. Gaston Islam will manage/discuss narcotics with patient. -Will try GI cocktail which include: Donnatol, Maalox, and viscous lidocaine  Case discussed with Dr. Tilford Pillar.

## 2010-11-15 NOTE — Patient Instructions (Signed)
Please follow up with Dr. Coralee Pesa in 6-8 weeks.

## 2010-11-15 NOTE — Progress Notes (Signed)
HPI: 60 yo woman with a complex medical history presents for follow up on her abdominal pain and depression.  She is feeling better in terms of depression, denies any SI/HI/hallucination.  Misty [name] restarted taking Wellbutrin after last office visit.  Her abdominal pain is still present and rates it 7/10 in severity, worse with food, constant in duration.  She only takes 1/2 tab of Tramadol because she cannot tolerate the side effects: N/V and states that Emetrol OTC does not help alleviate her symptoms.   She also wants refill on Ambien.   Also states that she is trying to quit smoking.  Denies any other systemic symptoms including chills or fever.  ROS: Review of Systems  Constitutional: + weight loss. Negative for fever and chills.  Respiratory: Negative for chest tightness and shortness of breath.  Cardiovascular: Negative for chest pain and palpitations.  Gastrointestinal: Positive for nausea, vomiting, abdominal pain.   Musculoskeletal: Positive for back pain.  Psychiatric/Behavioral: Positive for depression   PE: General: alert, cachetic appearing, and cooperative to examination.  Lungs: normal respiratory effort, no accessory muscle use, normal breath sounds, no crackles, and no wheezes. Heart: normal rate, regular rhythm, no murmur, no gallop, and no rub.  Abdomen: soft, , normal bowel sounds, no distention, slightly guarding 2/2 pain at stoma site on right mid-abdomen: no erythema or purulent drainage, bag filled with yellow-green content. Extremities: No cyanosis, clubbing, edema Neurologic: alert & oriented X3, cranial nerves II-XII intact, strength normal in all extremities, sensation intact to light touch, and gait normal.  Psych: Oriented X3, memory intact for recent and remote, normally interactive, good eye contact, mildly anxious appearing and depressed appearing.

## 2010-11-29 ENCOUNTER — Encounter: Payer: Medicare Other | Admitting: Internal Medicine

## 2011-01-10 ENCOUNTER — Ambulatory Visit (HOSPITAL_COMMUNITY)
Admission: RE | Admit: 2011-01-10 | Discharge: 2011-01-10 | Disposition: A | Discharge status: Home / Self Care | Payer: Medicare Other | Source: Ambulatory Visit | Attending: Cardiology | Admitting: Cardiology

## 2011-01-10 ENCOUNTER — Ambulatory Visit (INDEPENDENT_AMBULATORY_CARE_PROVIDER_SITE_OTHER): Payer: Medicare Other | Admitting: Internal Medicine

## 2011-01-10 ENCOUNTER — Encounter: Payer: Self-pay | Admitting: Internal Medicine

## 2011-01-10 ENCOUNTER — Other Ambulatory Visit: Payer: Self-pay

## 2011-01-10 VITALS — BP 107/72 | HR 93 | Temp 97.0°F | Wt 88.6 lb

## 2011-01-10 DIAGNOSIS — I472 Ventricular tachycardia: Secondary | ICD-10-CM

## 2011-01-10 DIAGNOSIS — K5090 Crohn's disease, unspecified, without complications: Secondary | ICD-10-CM

## 2011-01-10 DIAGNOSIS — R000 Tachycardia, unspecified: Secondary | ICD-10-CM | POA: Insufficient documentation

## 2011-01-10 DIAGNOSIS — F4321 Adjustment disorder with depressed mood: Secondary | ICD-10-CM

## 2011-01-10 DIAGNOSIS — G8929 Other chronic pain: Secondary | ICD-10-CM | POA: Insufficient documentation

## 2011-01-10 DIAGNOSIS — R109 Unspecified abdominal pain: Secondary | ICD-10-CM | POA: Insufficient documentation

## 2011-01-10 MED ORDER — MIRTAZAPINE 15 MG PO TABS: 15.0000 mg | ORAL_TABLET | Freq: Every day | ORAL | Status: AC

## 2011-01-10 NOTE — Assessment & Plan Note (Addendum)
I have told patient that she will need to get her pain meds from the pain clinic. Their notes stated that she needed to have a - UDS. Hopefully starting on the mirtazepine will allow her to come off of the [facility]. I do not currently have a follow up appointment in the next month but will try to open a spot for her. I have spoken with Dr. Jamey Ripa, her surgeon about this, and put in a call to Dr. Cindy Hazy pain specialist, [phone], who is to return my call.

## 2011-01-10 NOTE — Patient Instructions (Signed)
We will call you to get you back in the clinic soon. I am starting you on mirtazepine 15mg . Please take 1 pill at night. It will probably make you very drowsy. Good luck with our plan, I know that you can do it!

## 2011-01-10 NOTE — Progress Notes (Signed)
  Subjective:    Patient ID: Misty [name], female    DOB: 03-14-1951, 60 y.o.   MRN: [id number]  HPI Misty [name] comes in today c/o her chronic abdominal pain. She says that "Dr. Gaston Islam has washed his hands of her," and the surgeon he spoke with at [facility] deemed her not a surgical candidate. What she had failed to tell me was that the pain clinic that I sent her to had offered her narcotics, (I believe a fentanyl patch,those records have been sent out to be scanned into record), however she had failed the UDS. It was + for [facility].  Today I did discuss at length the fact the she was fortunate to have a pain clinic who will treat her. She says she is using the [facility] for sleep, appetite, and pain.   She is still on Wellbutrin for depression but options are limited because of QT prolongation and her episodes of torsaudes post-op after her last GI surgery. Ambien 10mg  is not working at all for insomnia.    Review of Systems  Constitutional: Positive for unexpected weight change.  Respiratory: Positive for cough.   Cardiovascular: Negative for chest pain.  Gastrointestinal: Positive for abdominal pain and diarrhea. Negative for vomiting.  Psychiatric/Behavioral: Positive for dysphoric mood.       Objective:   Physical Exam  Constitutional: She appears distressed.  Cardiovascular: Normal rate, regular rhythm and normal heart sounds.   Pulmonary/Chest: Breath sounds normal.  Abdominal: Bowel sounds are normal. There is tenderness.  Musculoskeletal: She exhibits no edema.  depressed appearing and thinner than when last in.        Assessment & Plan:

## 2011-01-10 NOTE — Assessment & Plan Note (Signed)
I have decided to start mirtazepine 15mg  q hs. This should help with sleep, appetite and depression. It does not have any QT interaction that I could find. Her EKG looked fine. Today. QT  Interval was 394. [name] is hesitant to stop the [facility], but I have explained that she will not be able to get pain meds unless she does.

## 2011-01-11 NOTE — Assessment & Plan Note (Signed)
At this point the etiology of her pain is not well understood by Dr. Jamey Ripa or GI. It is not thought to be secondary to be active Crohn's disease.

## 2011-01-13 ENCOUNTER — Telehealth: Payer: Self-pay | Admitting: Gastroenterology

## 2011-01-13 ENCOUNTER — Other Ambulatory Visit: Payer: Self-pay | Admitting: *Deleted

## 2011-01-13 NOTE — Telephone Encounter (Signed)
Left message on machine to call back  

## 2011-01-13 NOTE — Telephone Encounter (Signed)
Consult with Mike Gip, PA she advises to have pt seen by the PA Gunnar Fusi next week if Dr Christella Hartigan has no openings and if any symptoms change over the week end call or got to the ER. Pt has been scheduled for 01/17/11 with Gunnar Fusi

## 2011-01-13 NOTE — Telephone Encounter (Signed)
Pt is having pain in the right upper abd.  Has a "red sore" on the Stoma.  Has lost weight can't eat.  Down to 88 pounds.  Nausea, No fever.  This is nothing new to her but she is worried about the "red sore on the stoma".  Bright red and swollen.  Small amount of bleeding.  No discharge.

## 2011-01-17 ENCOUNTER — Ambulatory Visit: Payer: Medicare Other

## 2011-01-17 ENCOUNTER — Encounter: Payer: Self-pay | Admitting: Nurse Practitioner

## 2011-01-17 ENCOUNTER — Other Ambulatory Visit: Payer: Self-pay | Admitting: Internal Medicine

## 2011-01-17 ENCOUNTER — Ambulatory Visit (INDEPENDENT_AMBULATORY_CARE_PROVIDER_SITE_OTHER): Payer: Medicare Other | Admitting: Nurse Practitioner

## 2011-01-17 DIAGNOSIS — R634 Abnormal weight loss: Secondary | ICD-10-CM

## 2011-01-17 DIAGNOSIS — K5090 Crohn's disease, unspecified, without complications: Secondary | ICD-10-CM

## 2011-01-17 DIAGNOSIS — R109 Unspecified abdominal pain: Secondary | ICD-10-CM

## 2011-01-17 DIAGNOSIS — F17200 Nicotine dependence, unspecified, uncomplicated: Secondary | ICD-10-CM

## 2011-01-17 MED ORDER — ZOLPIDEM TARTRATE 10 MG PO TABS: 10.0000 mg | ORAL_TABLET | Freq: Every evening | ORAL | Status: DC | PRN

## 2011-01-17 MED ORDER — HYDROCORTISONE 2.5 % RE CREA: TOPICAL_CREAM | RECTAL | Status: DC

## 2011-01-17 NOTE — Assessment & Plan Note (Signed)
Reiterated that smoking is particularly bad for Crohn's disease and it is our recommendation that she discontinue tobacco products.

## 2011-01-17 NOTE — Telephone Encounter (Signed)
Spoke with patient regarding ambien refill as she was prescribed remeron 15mg  at beditme at last visit.  Pt states that the remeron does absolutely nothing for her and she will go back to the "lesser of the the two evils" the Rhame although she states it to does not work that well.  Pt instructed not to take both and I will forward message to her pcp for review.Criss Alvine, Misty Alfonzo Cassady9/4/201210:16 AM

## 2011-01-17 NOTE — Patient Instructions (Signed)
Please go to the basement level to have your labs drawn.  We have scheduled the CT Enterography on Friday 01-20-2011  . You need to arrive at 2:30 PM for a 4:00 PM   We sent a prescription for a steroid cream to use to the affected site of the stoma. It is suggested you stop smoking, it is bad for the Crohn's disease.

## 2011-01-17 NOTE — Progress Notes (Signed)
Misty [name] [id number] 07-19-1950   HISTORY OR PRESENT ILLNESS : Patient is a 60 year old female with a long-standing history of Crohn's, status post colectomy/ileostomy .Patient was seen as a new consult by Dr. Christella Hartigan in November 2011 for history of Crohn's disease. At that time patient presented with intermittent abdominal pain and an erythematous ostomy site. There was a concern for active disease, a CT enterography was obtained and showed an area of apparent focal small bowel wall thickening 5 cm proximal to the ileostomy. A flexible ileoscopy showed inflammation and ulceration of the the distal neo-terminal ileum . There was a focal, fibrotic  appearing stricture approximately 5cm proximal to ostomy that narrowed lumen to 5mm. ileal biopsies are negative for active inflammation. Patient was sent for surgical evaluation. In November 2011 she underwent small bowel resection with lysis of adhesions and ileostomy revision (strictured area).   Patien  presents with return abdominal pain over stoma which usually begins within a couple of hours after breakfast. Her bowel movements vary between watery to a thicker consistency which is about baseline for her. Occasional blood in stool. Yesterday she had nausea, vomiting and diarrhea but feels better today, though she hasn't eaten anything. No fevers. Weight down 10 pounds from 96 to 86 pounds since late February of this year. Scared to eat because of pain.   Current Medications, Allergies, Past Medical History, Past Surgical History, Family History and Social History were reviewed in [employer] record.   PHYSICAL EXAMINATION : General: Thin, chronically ill appearing female in no acute distress Head: Normocephalic and atraumatic Eyes:  sclerae anicteric,conjunctive pink. Ears: Normal auditory acuity Mouth: No deformity or lesions Neck: Supple, no masses.  Lungs: Clear throughout to auscultation Heart: Regular rate and  rhythm; no murmurs heard Abdomen: Soft, nondistended, nontender. No masses or hepatomegaly noted. Normal bowel sounds. On the bottom half of ostomy there is an area which is bright red and approximately 2.5 cm in size. The area is extremely tender to touch. It is not friable. No obvious ulcerations. Rectal: Not done Musculoskeletal: Symmetrical with no gross deformities  Skin: No lesions on visible extremities Extremities: No edema or deformities noted Neurological: Alert oriented x 4, grossly nonfocal Cervical Nodes:  No significant cervical adenopathy Psychological:  Alert and cooperative. Normal mood and affect  ASSESSMENT AND PLAN :

## 2011-01-17 NOTE — Assessment & Plan Note (Addendum)
Recurrent abdominal pain (over stoma) despite small bowel resection of strictured ileum November 2011. There was no evidence of active Crohn's disease on ileal biopsies in 2011. Patient's bowel movements have not changed, she has no arthralgias. Her main issue has been persistent abdominal pain. She has a erythematous area lower half of stoma. The area is not ulcerated, doubt active Crohn's disease but she has significant pain in that area. Will try topical steroids to the affected area 3 times a day. Will obtain labs including sedimentation rate and CRP. In addition, will repeat a CT enterography to look for evidence of active Crohn's disease. Patient will be called with test results and further recommendations. If above studies are normal, need to consider mesenteric ischemia as a possibility in this smoker with postprandial abdominal pain and weight loss.

## 2011-01-17 NOTE — Telephone Encounter (Signed)
I went ahead and sent in a prescription for ambien 10mg  a night. If the mirtazepine is not having any immediate effects then keep taking it with the ambien and buproprion and give it time (weeks) to start helping with sleep, appetite and depression.

## 2011-01-17 NOTE — Assessment & Plan Note (Signed)
Refer to abdominal pain.

## 2011-01-17 NOTE — Assessment & Plan Note (Signed)
No evidence of active Crohn's disease on ileal biopsies 2011.

## 2011-01-19 NOTE — Progress Notes (Signed)
i met with patient in exam room, evaluated her ostomy site.  Unclear etiology of her pain.  She had her stoma revised several months ago after complaining of pain for years and it seems like she has the same pains now.

## 2011-01-20 ENCOUNTER — Ambulatory Visit (HOSPITAL_COMMUNITY)
Admission: RE | Admit: 2011-01-20 | Discharge: 2011-01-20 | Disposition: A | Discharge status: Home / Self Care | Payer: Medicare Other | Source: Ambulatory Visit | Attending: Nurse Practitioner | Admitting: Nurse Practitioner

## 2011-01-20 DIAGNOSIS — F17200 Nicotine dependence, unspecified, uncomplicated: Secondary | ICD-10-CM

## 2011-01-20 DIAGNOSIS — R634 Abnormal weight loss: Secondary | ICD-10-CM

## 2011-01-20 DIAGNOSIS — K5090 Crohn's disease, unspecified, without complications: Secondary | ICD-10-CM

## 2011-01-20 LAB — CBC WITH DIFFERENTIAL/PLATELET
Basophils Relative: 0.4 % (ref 0.0–3.0)
Eosinophils Relative: 2 % (ref 0.0–5.0)
HCT: 43.9 % (ref 36.0–46.0)
Lymphs Abs: 2.5 10*3/uL (ref 0.7–4.0)
Monocytes Relative: 4.4 % (ref 3.0–12.0)
Neutrophils Relative %: 66 % (ref 43.0–77.0)
Platelets: 208 10*3/uL (ref 150.0–400.0)
RBC: 4.67 Mil/uL (ref 3.87–5.11)
WBC: 9.2 10*3/uL (ref 4.5–10.5)

## 2011-01-20 LAB — COMPREHENSIVE METABOLIC PANEL
Albumin: 4.5 g/dL (ref 3.5–5.2)
CO2: 20 mEq/L (ref 19–32)
GFR: 71.68 mL/min (ref >60.00)
Glucose, Bld: 153 mg/dL — ABNORMAL HIGH (ref 70–99)
Potassium: 3.8 mEq/L (ref 3.5–5.1)
Sodium: 140 mEq/L (ref 135–145)
Total Protein: 7.9 g/dL (ref 6.0–8.3)

## 2011-01-20 LAB — SEDIMENTATION RATE: Sed Rate: 10 mm/hr (ref 0–22)

## 2011-01-23 ENCOUNTER — Ambulatory Visit (HOSPITAL_COMMUNITY): Admission: RE | Admit: 2011-01-23 | Payer: Medicare Other | Source: Ambulatory Visit

## 2011-01-24 ENCOUNTER — Telehealth: Payer: Self-pay | Admitting: *Deleted

## 2011-01-24 NOTE — Telephone Encounter (Signed)
Spoke with radiology and patient did not have CT enterography. IV team could not access her for IV contrast and patient refused to drink oral contrast.

## 2011-01-27 ENCOUNTER — Encounter: Payer: Self-pay | Admitting: Internal Medicine

## 2011-01-27 ENCOUNTER — Ambulatory Visit (INDEPENDENT_AMBULATORY_CARE_PROVIDER_SITE_OTHER): Payer: Medicare Other | Admitting: Internal Medicine

## 2011-01-27 DIAGNOSIS — E46 Unspecified protein-calorie malnutrition: Secondary | ICD-10-CM

## 2011-01-27 DIAGNOSIS — K5090 Crohn's disease, unspecified, without complications: Secondary | ICD-10-CM

## 2011-01-27 NOTE — Assessment & Plan Note (Signed)
The patient presents with an area of erythema at the inferior border of her stoma, with no frank ulceration or exudate observed on exam.  The area appears similar to the description written in Dr. Larae Grooms note from 10 days ago, for which she was prescribed a hydrocortisone cream, which does not appear to have helped the area.  An ESR/CRP were negative, and her current symptoms do not seem indicative of a crohn's flare.  Rather, her symptoms likely represent a localized non-ulcerative inflammatory process, though the cause of which is unclear. -the patient was instructed to stop the hydrocortisone cream since it has not changed the area of erythema -the patient may continue to use topical lidocaine solution on the area if this practice gives symptomatic relief -the patient is encouraged to continue to use the newly-prescribed fentanyl patches, which may not have reached full efficacy at this point -the patient should follow-up with Dr. Gaston Islam 02/03/11, at 10:30 am for further evaluation of the area

## 2011-01-27 NOTE — Assessment & Plan Note (Signed)
The patient reports significant abdominal pain with any PO intake, and as such has significantly limited her PO intake recently. -the patient's newly-prescribed fentanyl patch should hopefully alleviate the patient's pain, and allow her to eat more

## 2011-01-27 NOTE — Patient Instructions (Addendum)
Your stoma looks inflamed, but does not look like a new ulcer -since the hydrocortisone cream has not changed the area, stop using this cream.  You may continue to use the lidocaine solution as needed. -continue to use the duragesic patch.  This medication may take some time to take effect -Please see Dr. Gaston Islam on 02/03/11, at 10:30 am, to follow-up on this issue

## 2011-01-27 NOTE — Progress Notes (Signed)
Acute visit  HPI The patient is Misty 60 yo [name], history of Crohn's (30-yr history, last ileostomy Nov 2011), chronic abdominal pain, and QT prolongation, presenting for Misty new area of stoma inflammation.  About 3 weeks ago, the patient noticed an area of increased erythema on her inferior stoma, associated with increased abdominal pain.  The area has not significantly changed in the last 3 weeks.  She saw Dr. Christella Hartigan (GI) 10 days ago, who noted the area of increased erythema, and prescribed Misty hydrocortisone cream to apply to the area, though the patient notes that has not had much effect.  The patient had Misty normal ESR/CRP at that visit, with Misty WBC count of 9.2, and was afebrile.  The patient was seen at the pain clinic yesterday, where the practitioner examined the area and called to make an appointment with [place] today out of concern for an ulceration of the area.  This morning, the patient was changing her stoma bag and thought she appreciated Misty small collection of pus at the superior border of her stoma.  The patient notes no fevers, chills, change in ostomy output, or blood in ostomy output.  The patient has been using Misty fentanyl patch for pain relief since last night, but has not experienced significant relief as of yet.  ROS: General: no fevers, chills, changes in weight, changes in appetite Skin: no rash HEENT: no blurry vision, hearing changes, sore throat Pulm: no dyspnea, coughing, wheezing CV: no chest pain, palpitations, shortness of breath Abd: see HPI GU: no dysuria, hematuria, polyuria Ext: no arthralgias, myalgias Neuro: no weakness, numbness, or tingling  Filed Vitals:   01/27/11 1336  BP: 104/60  Pulse: 71  Temp: 98.1 F (36.7 C)    PEX General: alert, cooperative, appears uncomfortable HEENT: pupils equal round and reactive to light, vision grossly intact, oropharynx clear and non-erythematous  Neck: supple, no lymphadenopathy Lungs: clear to ascultation bilaterally, normal  work of respiration, no wheezes, rales, ronchi Heart: regular rate and rhythm, no murmurs, gallops, or rubs Abdomen: stoma appears patent, with erythema noted on Misty raised portion of the inferior 1/2 of the stoma.  No frank ulceration or pustule noted.  Area is exquisitely tender to palpation Msk: no joint edema, warmth, or erythema Extremities: no cyanosis, clubbing, or edema Neurologic: alert & oriented X3, cranial nerves II-XII intact, strength grossly intact, sensation intact to light touch  Assessment/Plan:

## 2011-01-30 NOTE — Progress Notes (Signed)
Misty [name] is well known to me. I did speak to Dr. Gaston Islam about her last week. She was seen and examined with Dr. Manson Passey. I agree with his assessment and plan.

## 2011-01-31 ENCOUNTER — Other Ambulatory Visit (INDEPENDENT_AMBULATORY_CARE_PROVIDER_SITE_OTHER): Payer: Self-pay | Admitting: General Surgery

## 2011-02-01 ENCOUNTER — Telehealth: Payer: Self-pay | Admitting: *Deleted

## 2011-02-01 NOTE — Telephone Encounter (Signed)
Ok, she needs rov with me, next available. Please convert this to a phone note.   ----- Message ----- From: Daphine Deutscher, RN Sent: 02/01/2011 11:10 AM To: Rob Bunting, MD  Jesse Fall, RN 01/24/2011 4:15 PM Signed Spoke with radiology and patient did not have CT enterography. IV team could not access her for IV contrast and patient refused to drink oral contrast.    Routing History   From To Priority  01/24/2011 4:15 PM Daphine Deutscher, RN Meredith Pel, NP Routine   Created by                Scheduled patient on 03/01/11 at 8:45 AM for OV. Left a message for patient to call me.

## 2011-02-01 NOTE — Telephone Encounter (Signed)
Spoke with patient and gave her the appointment with Dr. Christella Hartigan.

## 2011-02-01 NOTE — Telephone Encounter (Signed)
Scheduled patient on 03/01/11 at 8:45 AM. Left a message for patient to call me.

## 2011-02-03 ENCOUNTER — Encounter (INDEPENDENT_AMBULATORY_CARE_PROVIDER_SITE_OTHER): Payer: Self-pay | Admitting: Surgery

## 2011-02-03 ENCOUNTER — Telehealth: Payer: Self-pay | Admitting: *Deleted

## 2011-02-03 ENCOUNTER — Ambulatory Visit (INDEPENDENT_AMBULATORY_CARE_PROVIDER_SITE_OTHER): Payer: Medicare Other | Admitting: Surgery

## 2011-02-03 VITALS — BP 98/62 | HR 72 | Temp 98.6°F | Resp 20 | Ht 65.0 in | Wt 94.2 lb

## 2011-02-03 DIAGNOSIS — Z432 Encounter for attention to ileostomy: Secondary | ICD-10-CM

## 2011-02-03 NOTE — Progress Notes (Signed)
The patient comes in for me to check her ileostomy. It was recently noted to have a question of an ulcerated area. She continues to have a lot of pain.  Past history, family history, and review of systems are all in Epic and not redictated into this node but have been reviewed.  Exam: Gen.: The patient is alert oriented and overall healthy, although very thin Abdomen: The ileostomy is pink and basically healthy. There is a prominent folds I don't believe is a polyp the area may be just some irritated mucosa from the enteric contents that are causing some irritation.  Impression: Continued pain and ileostomy site of uncertain etiology with prominent mucosal fold in the ostomy.  Plan: I think this is going to continue to be an issue but there is no surgical fix for this. There is no evidence of infection and ischemia Melvern Banker so I think just continued medical management is appropriate.  I will see her back PRN.

## 2011-02-03 NOTE — Telephone Encounter (Signed)
I called and spoke with her at length. At this point patient has decided to maximize pain control and not do further testing. Dr. Christella Hartigan, GI, did an ESR and CRP that were, by patient history, both normal, and wanted her to do a CT enterography (again looking for active Crohn's). Patient felt that she could not complete that test and that she does not have active Crohn's. Reviewing Dr. Larae Grooms note he is concerned about possible mesenteric ischemia with her post-prandial pain and smoking. We did discuss this today and she c/o of pain only in the one area by the stoma. She does not feel that she is able to stop smoking at this point. I did also review Dr. Lamount Cranker note from this am and he did not see anything worrisome on exam, nor does he feel that anything needs to be done from a surgical standpoint. I will see her back in the clinic in 4-5 weeks.

## 2011-02-03 NOTE — Telephone Encounter (Signed)
Pt called stating she just saw her surgeon and was told she just had a minor irritation and there is nothing he can do. She is also cancelling her appointment with the GI, there is no point of going. There is no need to call her back.

## 2011-02-07 IMAGING — CR DG ABDOMEN ACUTE W/ 1V CHEST
3 series · 3 of 3 positions shown · non-contrast
Comparison: 02/14/2010

CLINICAL DATA: Abdominal pain.  Crohn's disease.

ACUTE ABDOMEN SERIES (ABDOMEN 2 VIEW & CHEST 1 VIEW)

[w chest pa]
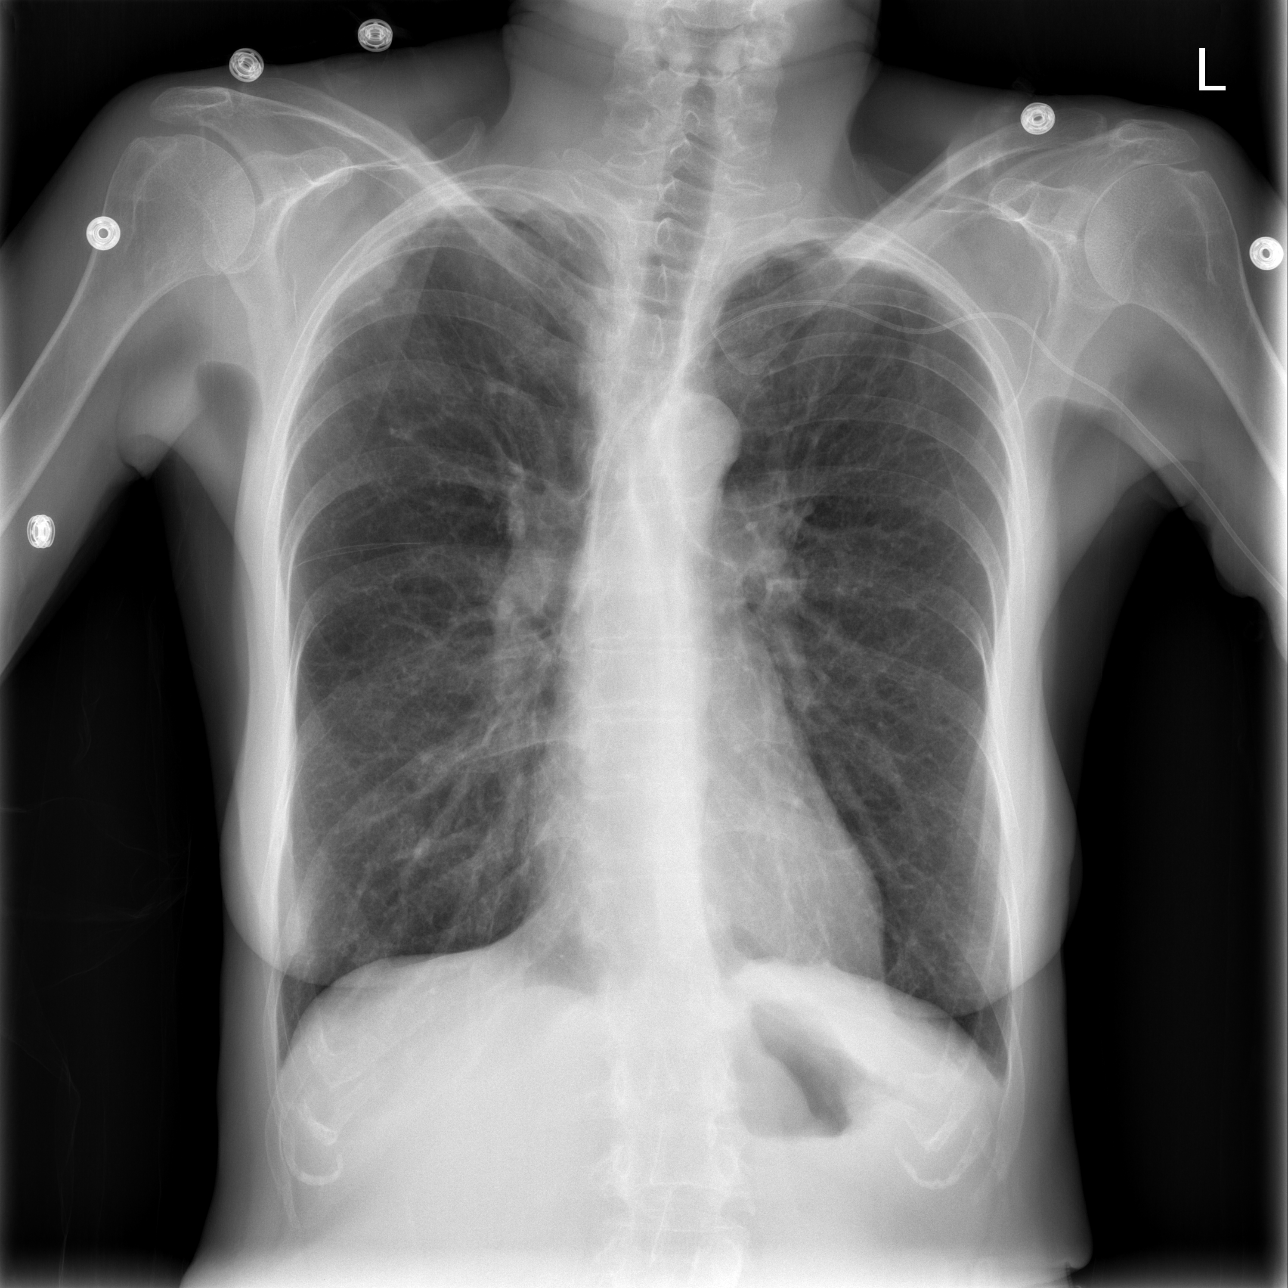

[w abdomen upright]
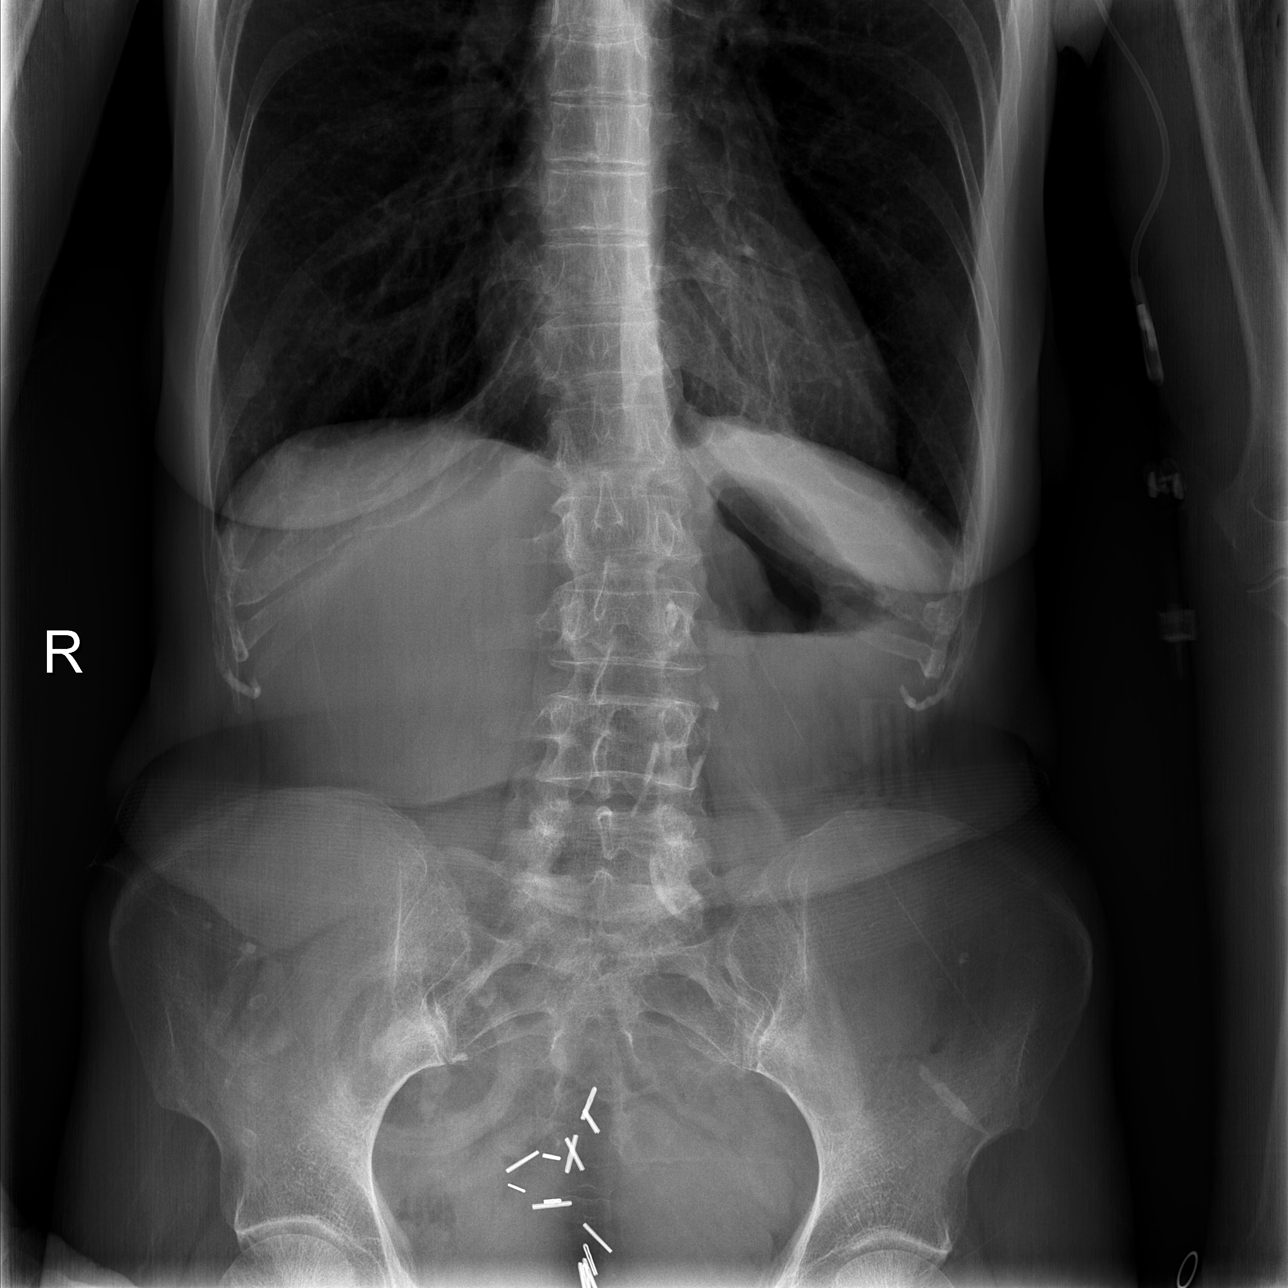

[t abdomen supine]
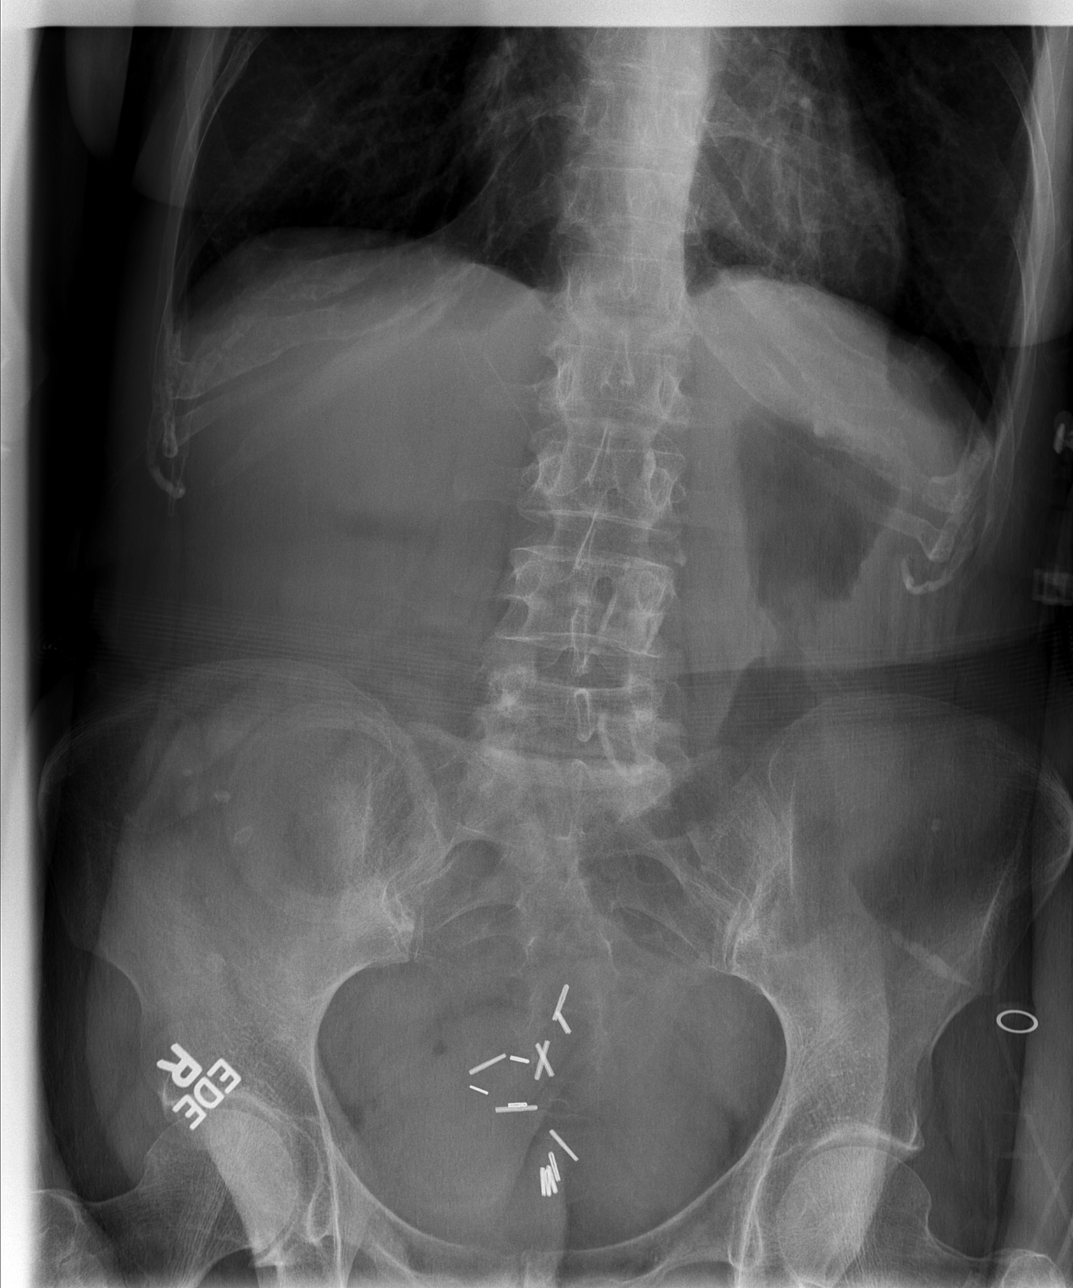

[3 of 3 positions shown; findings below may reference images not displayed]

FINDINGS: Ostomy ring again seen in the right lower quadrant, and
surgical clips are seen in the central pelvis.  There is no
evidence of dilated bowel loops or free air.  No evidence of air-
fluid levels.  There is a relative paucity of bowel gas.

Heart size and mediastinal contours are normal.  Both lungs are
clear.  No evidence of pleural effusion.  No mass or adenopathy
identified.  Left arm PICC line is seen in appropriate position.
IMPRESSION: No acute findings.

## 2011-03-01 ENCOUNTER — Ambulatory Visit: Payer: Medicare Other | Admitting: Gastroenterology

## 2011-03-03 IMAGING — CT CT ABD-PELV W/ CM
1 of 4 series · 13 of 32 positions shown, 18 images · IV contrast (APPLIED)
Comparison: Plain films of 06/17/2010.  CT of 06/05/2010.

CLINICAL DATA: Evaluate for fistula or abscess.  Crohn's disease.

CT ABDOMEN AND PELVIS WITH CONTRAST
TECHNIQUE: Multidetector CT imaging of the abdomen and pelvis was
performed following the standard protocol during bolus
administration of intravenous contrast.
Contrast: 80 ml [id number]-ZWW

[Series 2: abd/pelv with 5.0 b31f st · axial · 0.66mm/px · z∈[+838,+1203]mm · 13 of 83 slices shown, 18 images]
[im 5/83  soft-tissue]
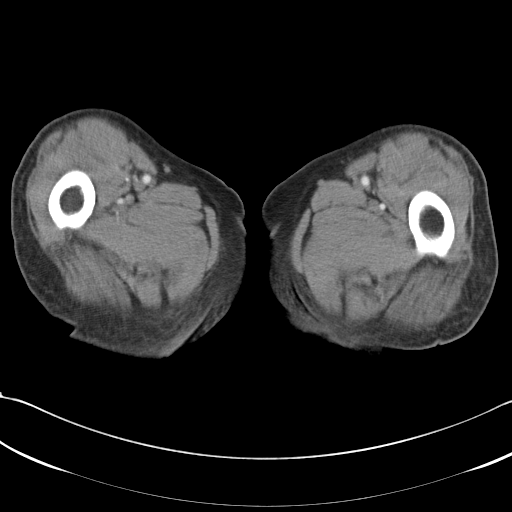
[im 5/83  bone]
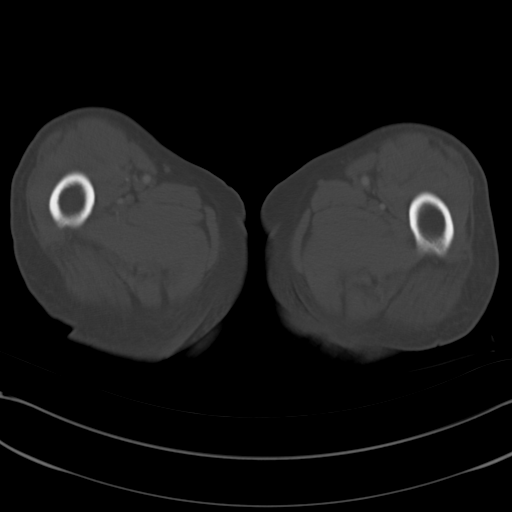
[im 15/83  soft-tissue]
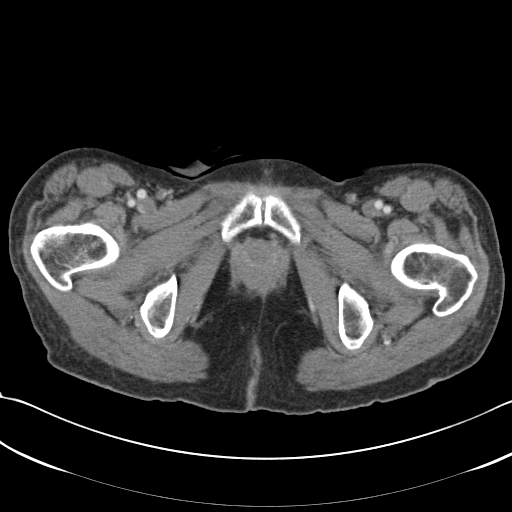
[im 20/83  soft-tissue]
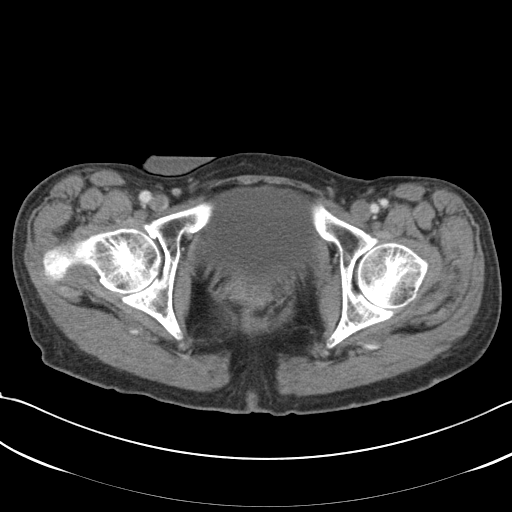
[im 25/83  soft-tissue]
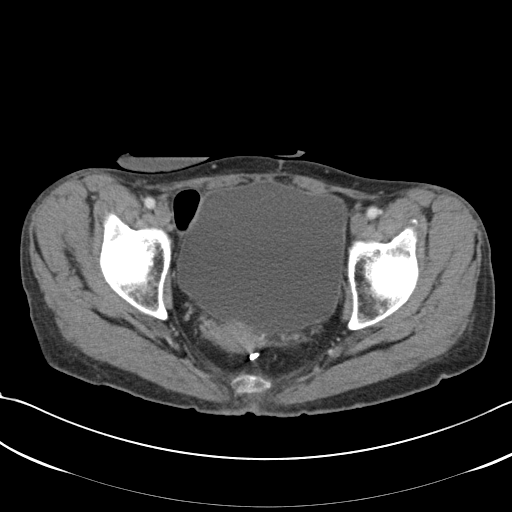
[im 34/83  soft-tissue]
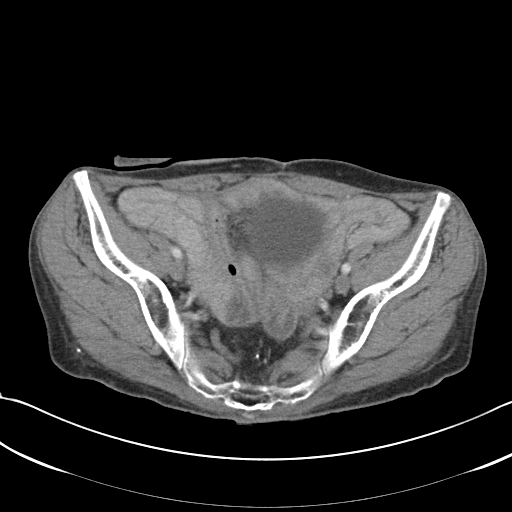
[im 39/83  soft-tissue]
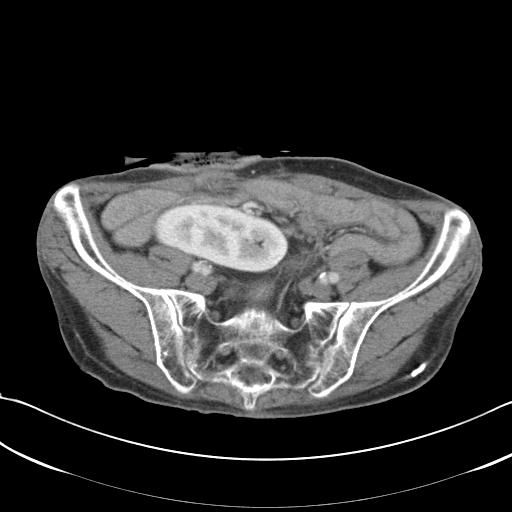
[im 44/83  soft-tissue]
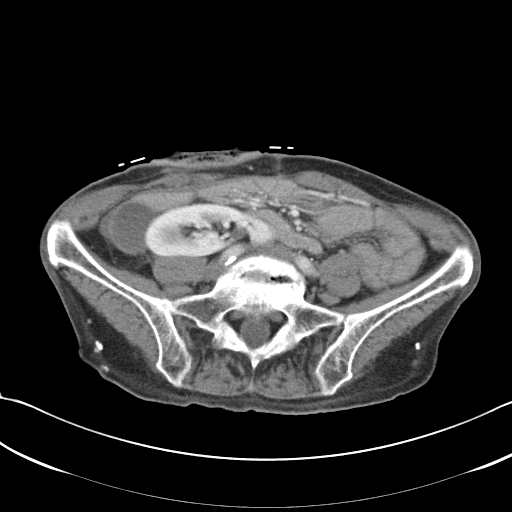
[im 54/83  soft-tissue]
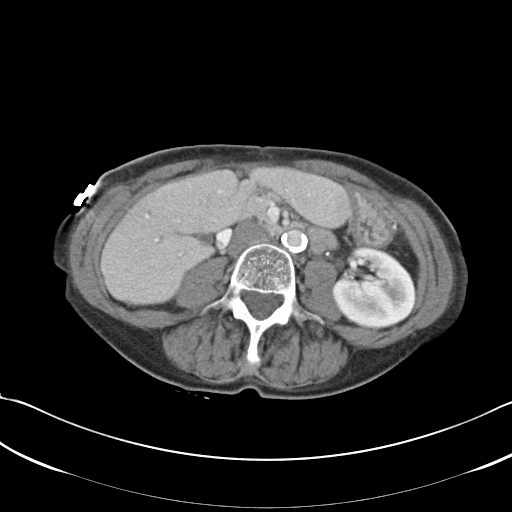
[im 58/83  soft-tissue]
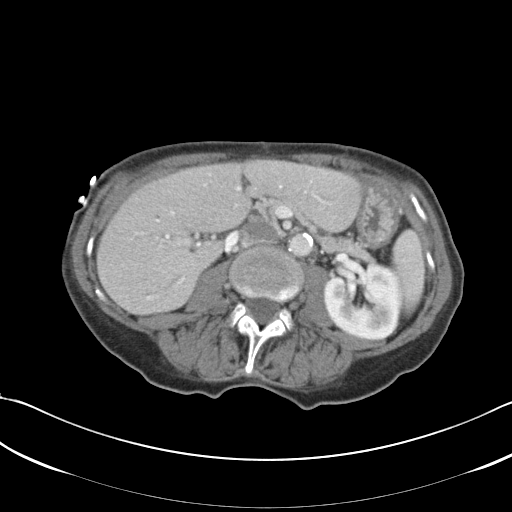
[im 58/83  bone]
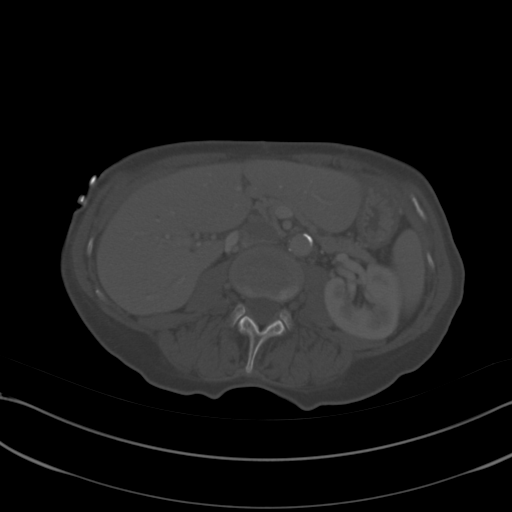
[im 63/83  soft-tissue]
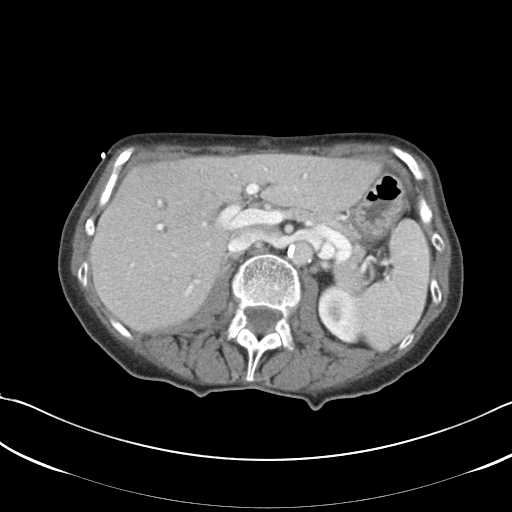
[im 63/83  lung]
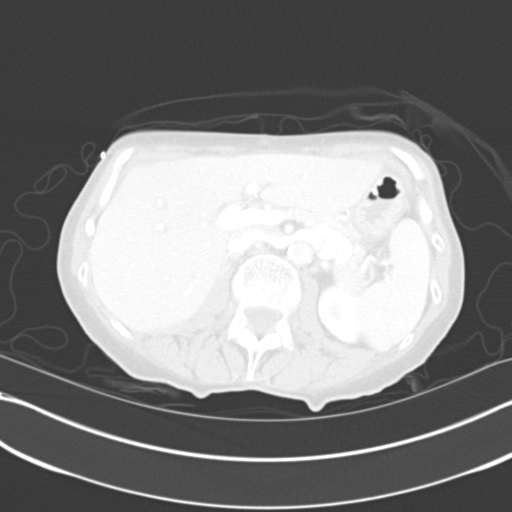
[im 68/83  lung]
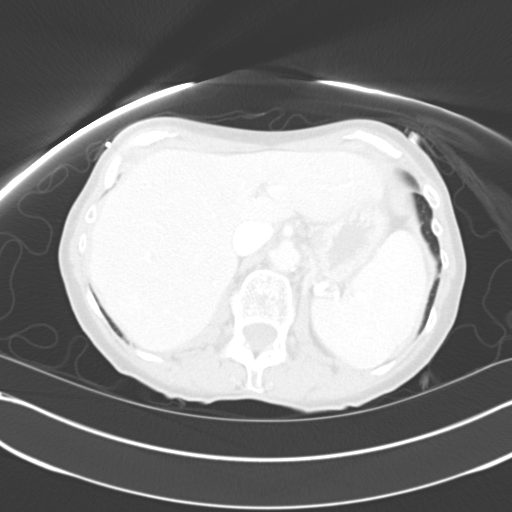
[im 73/83  soft-tissue]
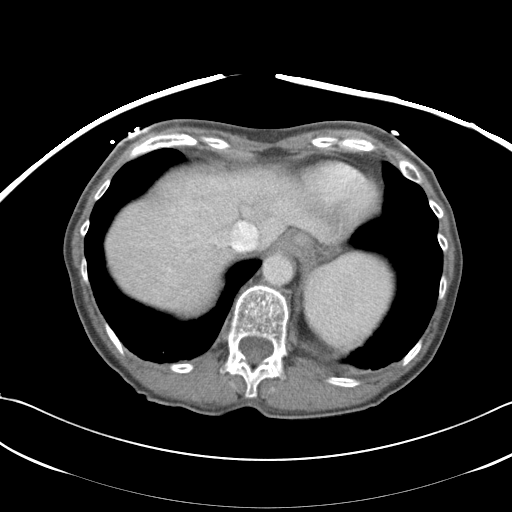
[im 73/83  lung]
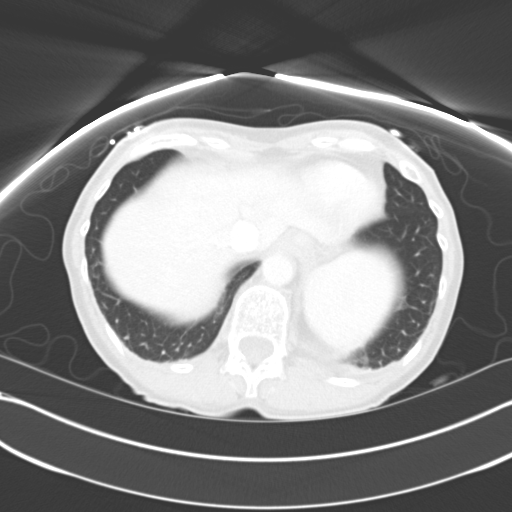
[im 78/83  soft-tissue]
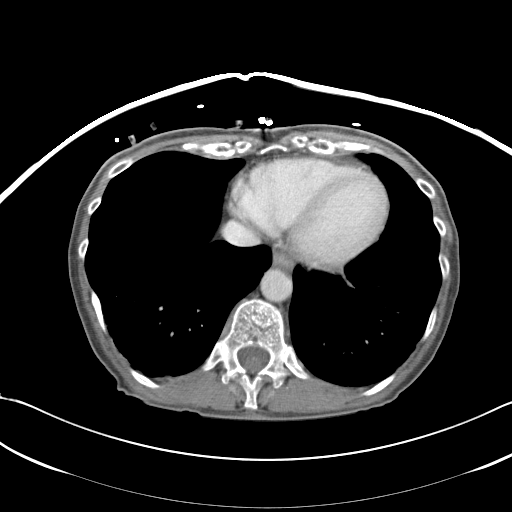
[im 78/83  lung]
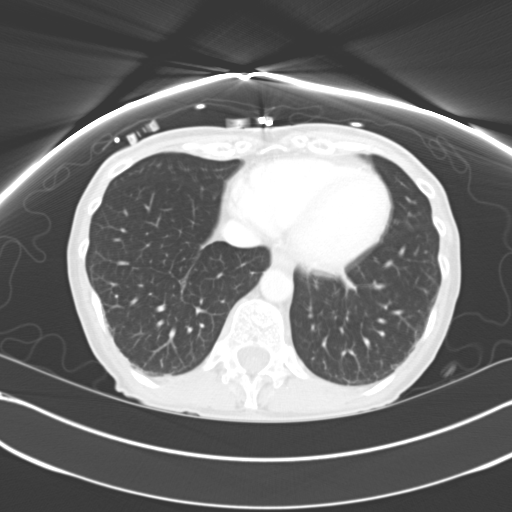

[13 of 32 positions shown; findings below may reference images not displayed]

FINDINGS: Clear lung bases.  Normal heart size without pericardial
effusion.  Trace left-sided pleural thickening or fluid, unchanged.
Normal liver, spleen.  The gastric antrum is underdistended but
appears thick-walled on image 30 seven.  No obstruction.   The
pancreatic  duct tube remains at the upper limits of normal.  No
obstruction identified.  No calcified gallstones.  Equivocal edema
adjacent the gallbladder fundus on image 41.  No biliary ductal
dilatation.

Normal adrenal glands.  Malrotated right kidney which is again
positioned within the high pelvis.  Normal left kidney.  No
obstruction.

No retroperitoneal or retrocrural adenopathy.

Status post colectomy and right lower quadrant ileostomy.  Small
bowel is underdistended throughout.  There is no bowel obstruction.
No mucosal hyperenhancement or wall thickening.  No pneumatosis or
free intraperitoneal air.  No evidence of abdominal abscess. No
ascites.  No pelvic adenopathy.  Surgical clips posterior to the
uterus. Normal urinary bladder.  Uterine atrophy versus prior
hysterectomy.  No adnexal mass.  Decreased to resolved cul-de-sac
pelvic fluid.  Moderate osteopenia. No acute osseous abnormality.
Degenerative disc disease at the lumbosacral junction.
IMPRESSION: 1.  Status post colectomy without evidence of active Crohn's
disease or complication.
2.  Decreased versus resolved cul-de-sac fluid.
3.  Equivocal edema adjacent the gallbladder.  No calcified stones.
Correlate with right upper quadrant symptoms and consider
ultrasound.
4.  Apparent gastric wall thickening which could be due to
underdistension.  Correlate with any symptoms to suggest gastritis.

## 2011-03-04 IMAGING — US US ABDOMEN COMPLETE
1 series · 14 of 25 positions shown · non-contrast
Comparison: Abdominal CT 06/18/2010.

CLINICAL DATA: Abdominal pain.  Question cholecystitis.

COMPLETE ABDOMINAL ULTRASOUND

[Series 1: us abdomen complete · 0.24mm/px · 14 of 65 slices shown]
[im 1/65]
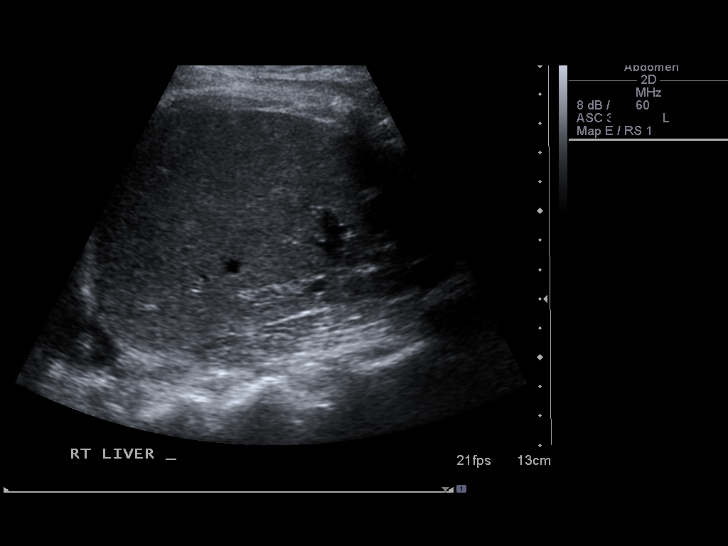
[im 6/65]
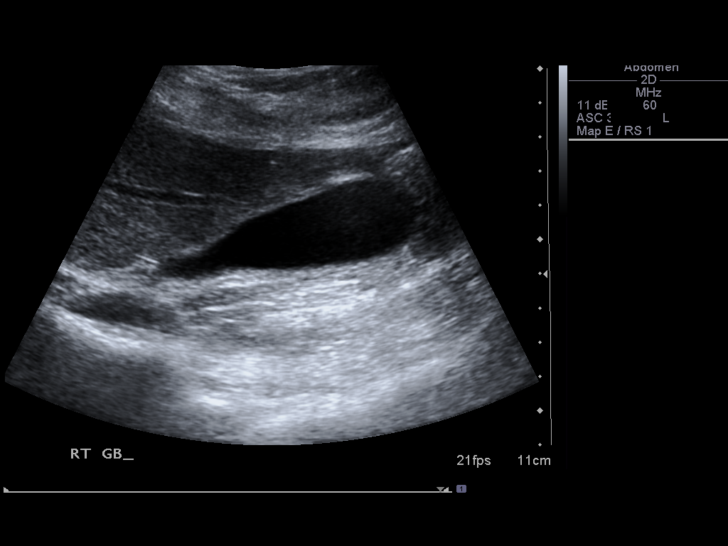
[im 11/65]
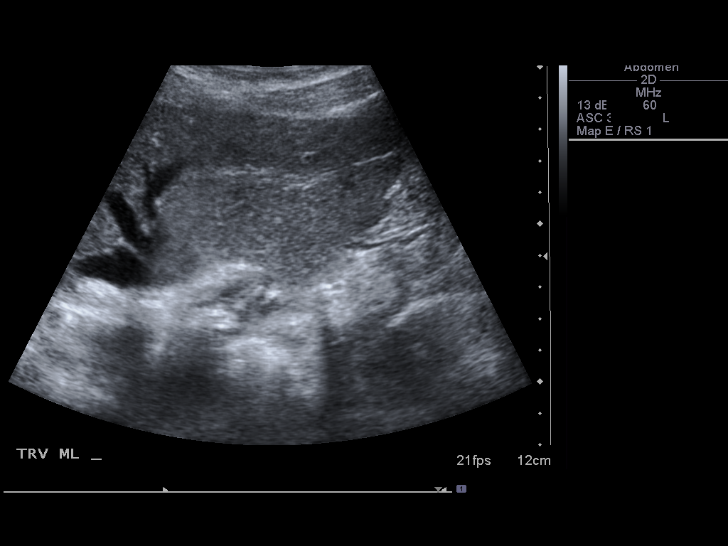
[im 17/65]
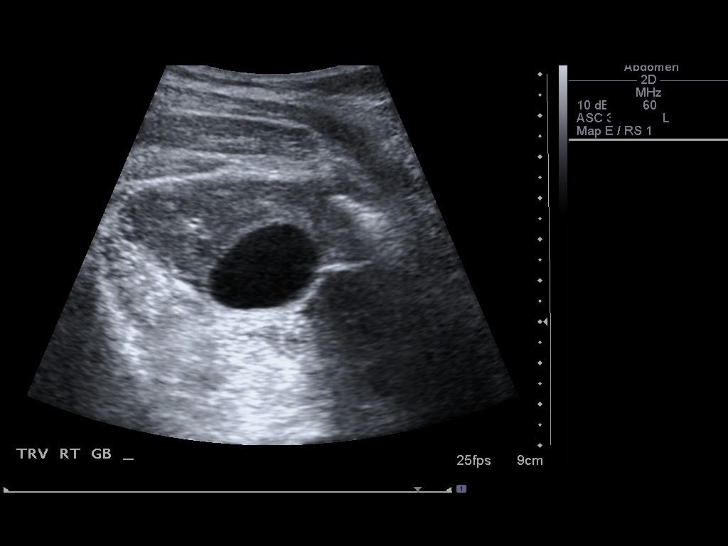
[im 22/65]
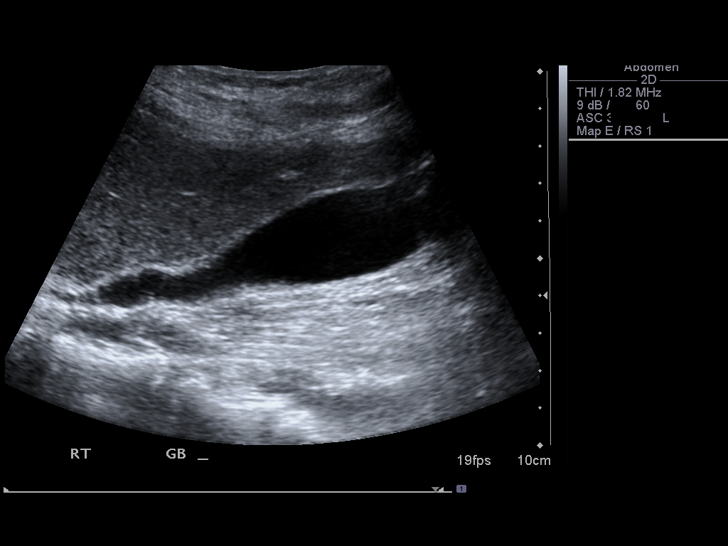
[im 25/65]
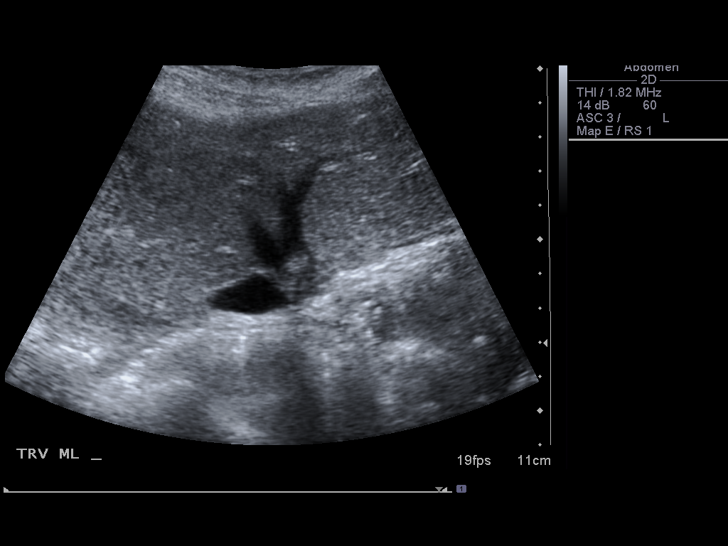
[im 30/65]
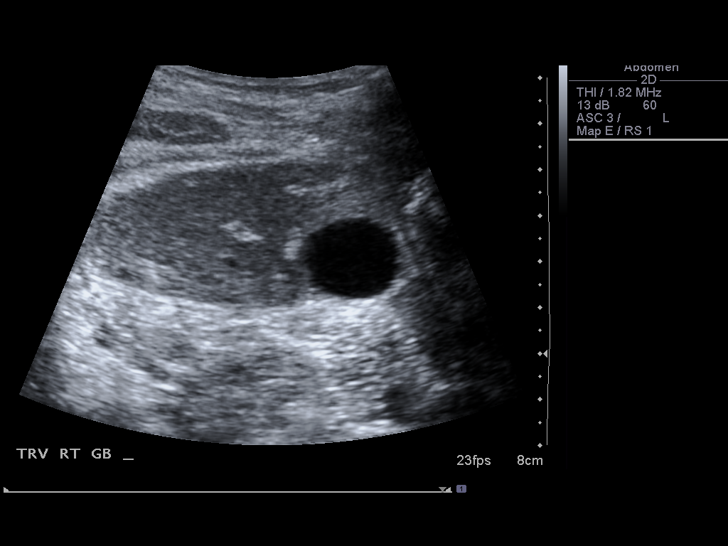
[im 35/65]
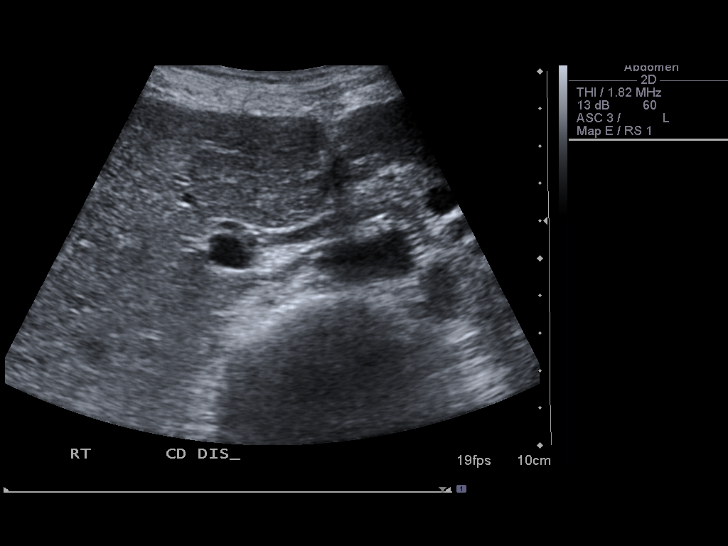
[im 41/65]
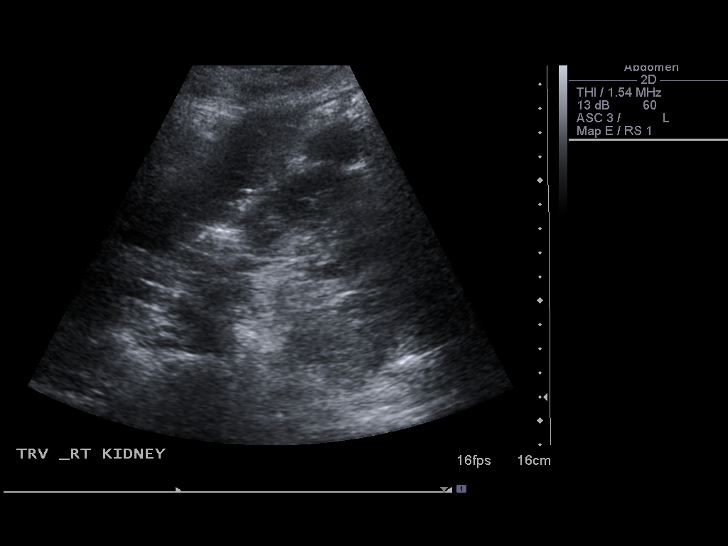
[im 43/65]
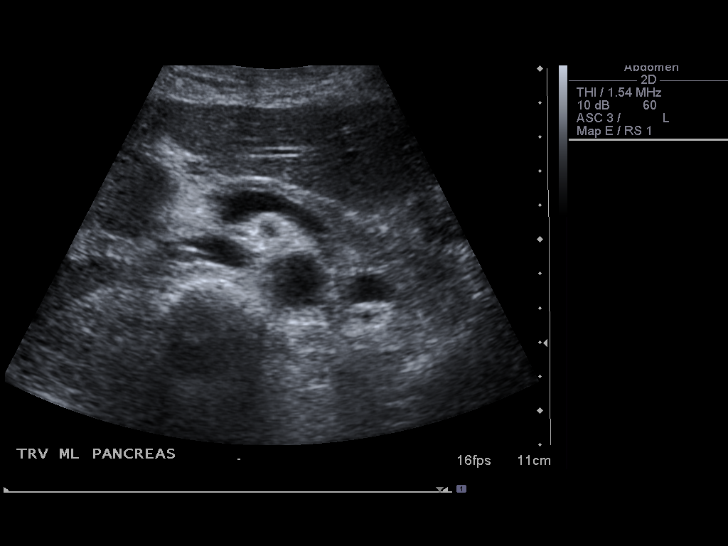
[im 49/65]
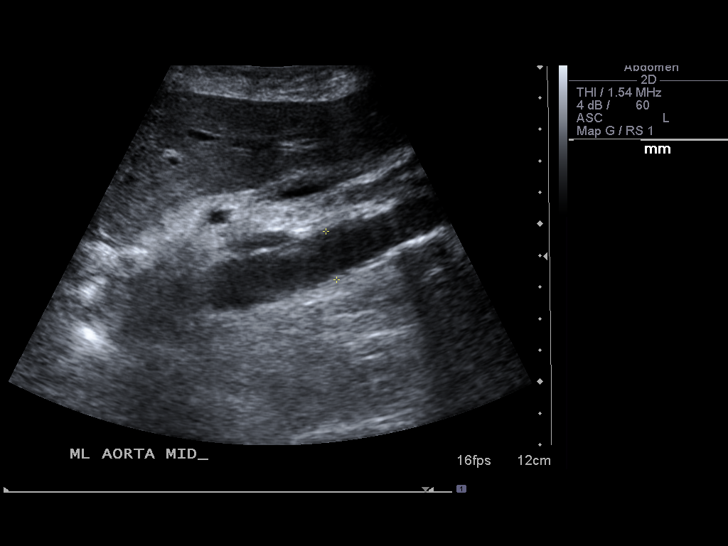
[im 54/65]
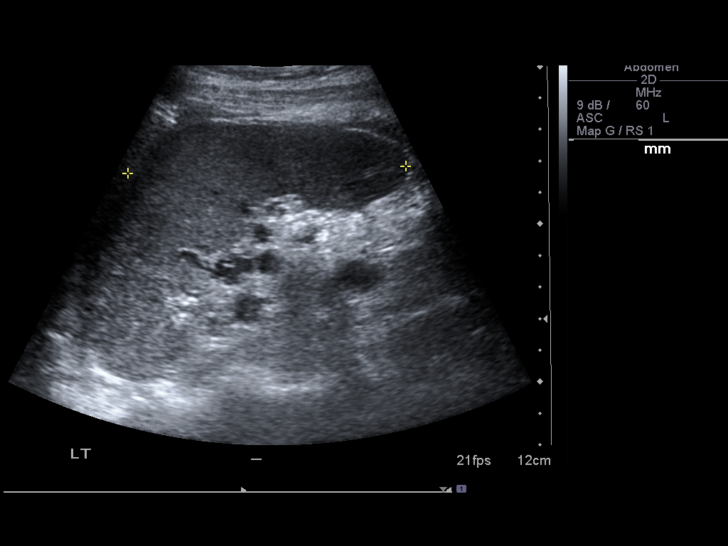
[im 59/65]
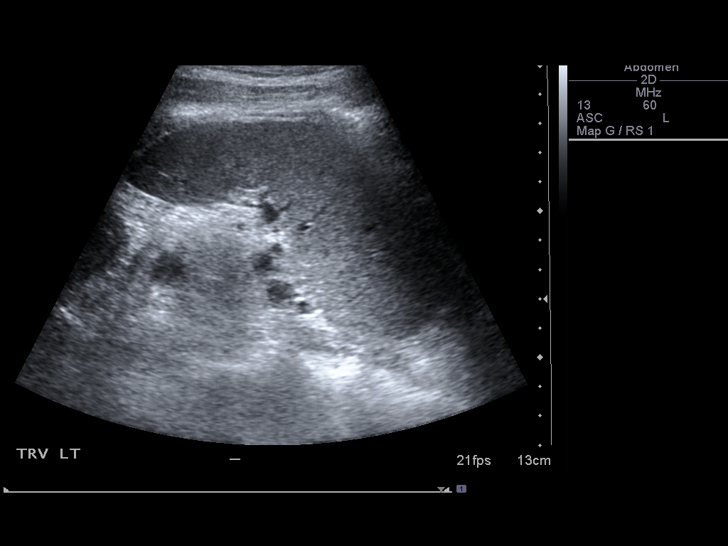
[im 65/65]
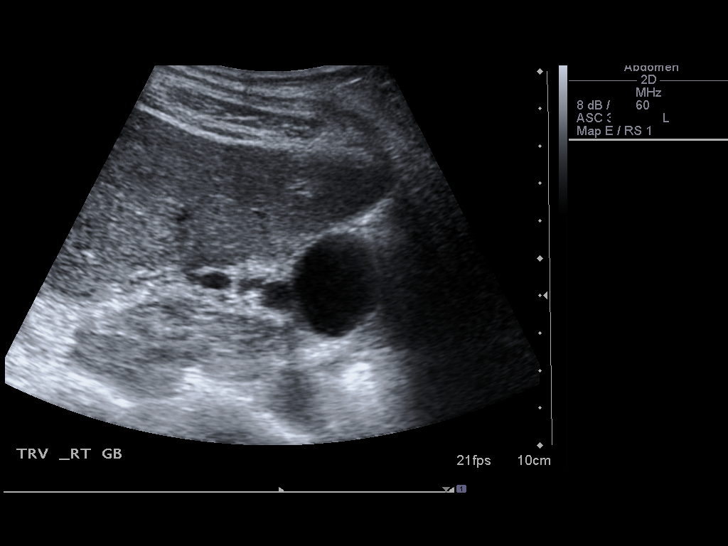

[14 of 25 positions shown; findings below may reference images not displayed]

FINDINGS: Gallbladder: Well distended without wall thickening, stones or
pericholecystic fluid. Negative sonographic Murphy's sign.

Common bile duct:   Normal in caliber without filling defects.

Liver:  Echogenicity is within normal limits.  No focal hepatic
abnormalities are identified.

IVC:  Visualized portions appear unremarkable.

Pancreas:  Visualized portions appear unremarkable.

Spleen:  Visualized portions appear unremarkable.

Right Kidney:  The right kidney is malrotated and located in the
false pelvis. The renal cortical thickness and echogenicity are
preserved.  There is no hydronephrosis or focal abnormality. Renal
length is 10.0 cm.

Left Kidney:   The renal cortical thickness and echogenicity are
preserved.  There is no hydronephrosis or focal abnormality. Renal
length is 9.8 cm.

Abdominal aorta:  Visualized portions appear unremarkable.
IMPRESSION: 1.  No evidence of gallbladder wall thickening, cholelithiasis or
sonographic Murphy's sign.
2.  No demonstrated acute abdominal findings.

## 2011-03-07 ENCOUNTER — Encounter: Payer: Self-pay | Admitting: Internal Medicine

## 2011-03-07 ENCOUNTER — Ambulatory Visit (INDEPENDENT_AMBULATORY_CARE_PROVIDER_SITE_OTHER): Payer: Medicare Other | Admitting: Internal Medicine

## 2011-03-07 VITALS — BP 99/63 | HR 66 | Temp 98.0°F | Wt 102.6 lb

## 2011-03-07 DIAGNOSIS — F4321 Adjustment disorder with depressed mood: Secondary | ICD-10-CM

## 2011-03-07 DIAGNOSIS — R609 Edema, unspecified: Secondary | ICD-10-CM

## 2011-03-07 DIAGNOSIS — M810 Age-related osteoporosis without current pathological fracture: Secondary | ICD-10-CM

## 2011-03-07 DIAGNOSIS — I4581 Long QT syndrome: Secondary | ICD-10-CM

## 2011-03-07 DIAGNOSIS — R634 Abnormal weight loss: Secondary | ICD-10-CM

## 2011-03-07 DIAGNOSIS — E46 Unspecified protein-calorie malnutrition: Secondary | ICD-10-CM

## 2011-03-07 LAB — COMPREHENSIVE METABOLIC PANEL
ALT: 9 U/L (ref 0–35)
AST: 18 U/L (ref 0–37)
CO2: 19 mEq/L (ref 19–32)
Creat: 0.67 mg/dL (ref 0.50–1.10)
Total Bilirubin: 0.3 mg/dL (ref 0.3–1.2)

## 2011-03-07 LAB — CBC
MCH: 31 pg (ref 26.0–34.0)
MCHC: 32.7 g/dL (ref 30.0–36.0)
MCV: 94.8 fL (ref 78.0–100.0)
Platelets: 222 10*3/uL (ref 150–400)
RDW: 13.4 % (ref 11.5–15.5)

## 2011-03-07 NOTE — Progress Notes (Signed)
  Subjective:    Patient ID: Misty [name], female    DOB: 1950/06/13, 60 y.o.   MRN: [id number]  HPI Misty [name] returns today and is doing so much better. Her weight is up to 102, from a low of 88 pounds and the fentanyl is controlling her pain! She is able to eat normally. She does c/o hot flashes today that she has had since age 7, but does not want any therapy for this. As well she has some swelling in her ankles. She denies chest pain, SOB, kidney or liver symptoms..  She continues to have financial stressors and is applying for section 8 housing.  The practioner she sees at the pain clinic wonders about an anti-spasmodic to help with her gi pain. Unfortunately phenergan had been helping but it also can cause QT prolongation so it had to be stopped.    Review of Systems  Constitutional: Negative for fever, chills and fatigue.  Respiratory: Negative for shortness of breath.   Cardiovascular: Negative for chest pain.  Gastrointestinal: Negative for nausea and vomiting.  Psychiatric/Behavioral: Negative for suicidal ideas and sleep disturbance.   As per HPI, and    Objective:   Physical Exam  Constitutional: She appears well-developed. No distress.  HENT:  Head: Normocephalic and atraumatic.  Cardiovascular: Normal rate, regular rhythm and normal heart sounds.   No murmur heard. Pulmonary/Chest: Breath sounds normal.  Abdominal: Soft. Bowel sounds are normal. There is no tenderness.  Musculoskeletal: Normal range of motion. She exhibits edema.  Skin: Skin is warm and dry.  Psychiatric: She has a normal mood and affect.          Assessment & Plan:

## 2011-03-07 NOTE — Assessment & Plan Note (Signed)
Continue fosamax. She is tolerating without problem.

## 2011-03-07 NOTE — Assessment & Plan Note (Signed)
[name] looks great. Continue wellbutrin.

## 2011-03-07 NOTE — Patient Instructions (Signed)
i will see you back in 2-3 months. You look great! I am so glad that you are doing better! We are doing blood work and will let you know if it is abnormal.

## 2011-03-07 NOTE — Assessment & Plan Note (Signed)
Unfortunately this is still limiting choice of medications including something for antispasmodic.

## 2011-03-07 NOTE — Assessment & Plan Note (Signed)
Finally patient has been able to put on some weight as her pain is controlled. Will check labs today including albumin.

## 2011-03-07 NOTE — Assessment & Plan Note (Signed)
I suspect this is benign etiology. Will check labs. May be nutritional.

## 2011-03-09 LAB — T4, FREE: Free T4: 0.85 ng/dL (ref 0.80–1.80)

## 2011-03-09 NOTE — Progress Notes (Signed)
Addended by: Coralee Pesa MD, Joen Laura E on: 03/09/2011 12:23 PM   Modules accepted: Orders

## 2011-03-23 ENCOUNTER — Encounter: Payer: Self-pay | Admitting: *Deleted

## 2011-05-11 ENCOUNTER — Other Ambulatory Visit: Payer: Self-pay | Admitting: *Deleted

## 2011-05-11 MED ORDER — ZOLPIDEM TARTRATE 10 MG PO TABS: 10.0000 mg | ORAL_TABLET | Freq: Every evening | ORAL | Status: DC | PRN

## 2011-05-11 NOTE — Telephone Encounter (Signed)
Rx called in 

## 2011-05-23 ENCOUNTER — Encounter: Payer: Self-pay | Admitting: Internal Medicine

## 2011-05-23 ENCOUNTER — Ambulatory Visit (INDEPENDENT_AMBULATORY_CARE_PROVIDER_SITE_OTHER): Payer: Medicare Other | Admitting: Internal Medicine

## 2011-05-23 VITALS — BP 99/62 | HR 64 | Temp 97.4°F | Ht 65.0 in | Wt 112.8 lb

## 2011-05-23 DIAGNOSIS — H269 Unspecified cataract: Secondary | ICD-10-CM

## 2011-05-23 DIAGNOSIS — E46 Unspecified protein-calorie malnutrition: Secondary | ICD-10-CM

## 2011-05-23 DIAGNOSIS — F4321 Adjustment disorder with depressed mood: Secondary | ICD-10-CM

## 2011-05-23 DIAGNOSIS — G8929 Other chronic pain: Secondary | ICD-10-CM

## 2011-05-23 DIAGNOSIS — M810 Age-related osteoporosis without current pathological fracture: Secondary | ICD-10-CM

## 2011-05-23 DIAGNOSIS — R109 Unspecified abdominal pain: Secondary | ICD-10-CM

## 2011-05-23 DIAGNOSIS — F17200 Nicotine dependence, unspecified, uncomplicated: Secondary | ICD-10-CM

## 2011-05-23 NOTE — Assessment & Plan Note (Signed)
Patient is currently doing very well on Wellbutrin and Ambien and I refilled these and she should continue on these. Because of problem with QT prolongation and postop complication of torsades she had Celexa and trazodone DC'd and should not be on medications that prolong her QT.

## 2011-05-23 NOTE — Assessment & Plan Note (Signed)
Thankfully this problem has resolved with treatment of her chronic pain.

## 2011-05-23 NOTE — Assessment & Plan Note (Signed)
This is a complication of patient taking actually prednisone and she got from the vet for years. As far as I know she has not yet been able to followup on these cataracts. You might want to encourage her to do so once everything has been stable for some time.

## 2011-05-23 NOTE — Assessment & Plan Note (Addendum)
Patient is currently on Fosamax. I just started this I believe in 2011. Again she has been on prednisone for many years mostly that she had given herself that she had gotten from veterinary clinic for symptomatic treatment of her Crohn's disease.

## 2011-05-23 NOTE — Assessment & Plan Note (Signed)
We have discussed tobacco cessation many times. When Dr. Georgiann Mohs was considering possibly going back in to operate on her that would only be considered if she stopped smoking. At this point it is thought to output by Dr. Darylene Price also been discussed by doctors at [facility] that she would not be a good candidate to reoperate. She is currently on Wellbutrin and is going to is not considering tobacco cessation.

## 2011-05-23 NOTE — Assessment & Plan Note (Signed)
Please see GI  note under "abdominal pain" note from 01/17/11. Misty [name] is doing much better from this standpoint. She is followed by [facility] who has her on a fentanyl patch. She is doing much better and has gained close to 20 pounds I believe. She looks great and she is definitely tolerating by mouth much better then the entire 2-21/2 years I have known her. She's has had at least a 30 year history of Crohn's disease. This is not thought to be active recently. She saw a GI at [facility] as recently as September 2012. Patient's surgeon is Dr. Cicero Duck if she should need him that she has not seen him very recently, he has been very sympathetic and known her for 30 years.

## 2011-05-23 NOTE — Patient Instructions (Signed)
Please follow up with Dr. Manson Passey in 3 months. I am so glad that you are doing so much better.

## 2011-05-23 NOTE — Progress Notes (Signed)
Subjective:     Patient ID: Misty [name], female   DOB: Sep 10, 1950, 61 y.o.   MRN: [id number]  HPI 61 year old patient well known to me who comes in for followup. Misty [name] is doing much better. She is followed by pain clinic and is on fentanyl patch. Because of this she is able to eat better and her pain is much better controlled in her abdomen. Therefore she is up to a weight of 112 and looks much better than I have seen her probably the entire time I've been taking care of her.  Her depression is well controlled on Wellbutrin. She still is taking [place] for sleep. She did not find the Remeron as helpful either for depression or sleep. Patient does have a lot of questions today about the QT prolongation which we discussed at length. She did have postop complications with torsades in the ICU and therefore her Celexa had to be stopped as did her trazodone. She had seen Dr. Graciela Husbands at what our cardiology and is on metoprolol for treatment of this. She does not have a followup appointment with our cardiology at this point.  Her surgeon of 30 years is Dr. Cicero Duck. He has followed her for her Crohn's disease and persistent surgery for her 25-30 years ago. At this point she is not thought to have active Crohn's disease. 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    Review of Systems  Constitutional: Negative for fever, chills, activity change and fatigue.  Respiratory: Negative for shortness of breath.   Cardiovascular: Negative for chest pain.  Gastrointestinal: Positive for abdominal pain. Negative for nausea and vomiting.  Psychiatric/Behavioral: Negative for dysphoric mood.       Objective:   Physical Exam  Constitutional: She appears well-nourished.  Cardiovascular: Normal rate and regular rhythm.   Pulmonary/Chest: Breath sounds normal.  Abdominal: Bowel sounds are normal. There is no tenderness.  Musculoskeletal: She exhibits no edema.       Assessment:         Plan:

## 2011-06-06 ENCOUNTER — Ambulatory Visit: Payer: Medicare Other | Admitting: Internal Medicine

## 2011-07-15 ENCOUNTER — Other Ambulatory Visit: Payer: Self-pay | Admitting: Internal Medicine

## 2011-07-20 ENCOUNTER — Other Ambulatory Visit: Payer: Self-pay | Admitting: *Deleted

## 2011-07-21 ENCOUNTER — Other Ambulatory Visit: Payer: Self-pay | Admitting: *Deleted

## 2011-07-21 NOTE — Telephone Encounter (Signed)
Empty encouter.

## 2011-07-25 MED ORDER — METOPROLOL SUCCINATE ER 25 MG PO TB24: 25.0000 mg | ORAL_TABLET | Freq: Every day | ORAL | Status: DC

## 2011-08-22 ENCOUNTER — Telehealth: Payer: Self-pay | Admitting: *Deleted

## 2011-08-22 NOTE — Telephone Encounter (Signed)
Pt called asking for an appointment ASAP. She has been under stress stating her dog died a week ago and left 10 pups that she is having to bottle feed!!  She is SOB, vomiting, feels her heart  racing and can't sleep.   I gave her an appointment for tomorrow with Dr Manson Passey, her PCP.   Pt instructed if she has any changes, any more SOB to go to ED. She voices understanding. Pt # [id number]

## 2011-08-22 NOTE — Telephone Encounter (Signed)
Agree with appt Thanks 

## 2011-08-23 ENCOUNTER — Encounter: Payer: Self-pay | Admitting: Internal Medicine

## 2011-08-23 ENCOUNTER — Ambulatory Visit (INDEPENDENT_AMBULATORY_CARE_PROVIDER_SITE_OTHER): Payer: Medicare Other | Admitting: Internal Medicine

## 2011-08-23 VITALS — BP 123/69 | HR 80 | Temp 97.5°F | Ht 65.0 in | Wt 108.7 lb

## 2011-08-23 DIAGNOSIS — Z634 Disappearance and death of family member: Secondary | ICD-10-CM

## 2011-08-23 MED ORDER — CLONAZEPAM 0.5 MG PO TABS: 0.5000 mg | ORAL_TABLET | Freq: Two times a day (BID) | ORAL | Status: DC | PRN

## 2011-08-23 NOTE — Patient Instructions (Signed)
You are undergoing a natural grieving process for your loss.  No medication will take away these very appropriate, understandable emotions.  For your acute stress to help you through the next couple of weeks, you may try clonazepam, 1 tablet twice per day as needed.  This is a potentially addicting medication, and will be used for this one-time reason, and will not be refilled.  Please return in 2-3 weeks so that we can check up on you and make sure you are coping with your loss.

## 2011-08-23 NOTE — Assessment & Plan Note (Addendum)
The patient presents with bereavement over dog's death yesterday.  Patient desires something for anxiety, stating she fears that stress may trigger a crohn's flare.  The patient has a history of crohn's, but no documented crohn's disease since a colectomy in 2011.  We discussed that the patient's symptoms were part of the normal bereavement process, and that they were not part of a "disease" that could be treated with medications.  I encouraged the patient to turn to those around her for support, and to see if friends could help her with her dogs, so that she could get a good night's rest.  For short-term anxiety relief, I have decided to prescribe clonazepam, 0.5 mg BID prn.  We had a long discussion about the potentially addicting nature of this medication, and the fact that this was for one-time usage, not for long-term usage.  We will see the patient back in 2-3 weeks to ensure that she is coping well.

## 2011-08-23 NOTE — Progress Notes (Signed)
HPI The patient is a 61 yo woman, history of crohn's disease s/p colectomy (no evidence of recurrence since colectomy), depression, tobacco abuse, presenting for an acute visit.  The patient notes that her 19 year old dog just died, leaving behind 10 puppies that must be bottle-fed for the next month.  She is experiencing significant stress, as well as nausea and anxiety, since the death.  She presents asking for something for anxiety, concerned that her current stress may trigger a crohn's flare.  ROS: General: no fevers, chills, changes in weight, changes in appetite Skin: no rash HEENT: no blurry vision, hearing changes, sore throat Pulm: no dyspnea, coughing, wheezing CV: no chest pain, palpitations, shortness of breath Abd: no diarrhea/constipation GU: no dysuria, hematuria, polyuria Ext: no arthralgias, myalgias Neuro: no weakness, numbness, or tingling  Filed Vitals:   08/23/11 1612  BP: 123/69  Pulse: 80  Temp: 97.5 F (36.4 C)    PEX General: alert, cooperative, crying throughout exam HEENT: pupils equal round and reactive to light, vision grossly intact, oropharynx clear and non-erythematous  Neck: supple, no lymphadenopathy Lungs: clear to ascultation bilaterally, normal work of respiration, no wheezes, rales, ronchi Heart: regular rate and rhythm, no murmurs, gallops, or rubs Abdomen: soft, non-tender, non-distended, normal bowel sounds, stoma present Msk: no joint edema, warmth, or erythema Extremities: no cyanosis, clubbing, or edema Neurologic: alert & oriented X3, cranial nerves II-XII intact, strength grossly intact, sensation intact to light touch  Assessment/Plan

## 2011-08-28 ENCOUNTER — Other Ambulatory Visit: Payer: Self-pay | Admitting: Internal Medicine

## 2011-08-30 ENCOUNTER — Encounter (HOSPITAL_COMMUNITY): Payer: Self-pay

## 2011-08-30 ENCOUNTER — Emergency Department (INDEPENDENT_AMBULATORY_CARE_PROVIDER_SITE_OTHER)
Admission: EM | Admit: 2011-08-30 | Discharge: 2011-08-30 | Disposition: A | Discharge status: Home / Self Care | Payer: Medicare Other | Source: Home / Self Care | Attending: Emergency Medicine | Admitting: Emergency Medicine

## 2011-08-30 DIAGNOSIS — M792 Neuralgia and neuritis, unspecified: Secondary | ICD-10-CM

## 2011-08-30 DIAGNOSIS — M5412 Radiculopathy, cervical region: Secondary | ICD-10-CM

## 2011-08-30 NOTE — Discharge Instructions (Signed)
As discussed followup with your primary care Dr. to make sure this pain and discomfort improves as further diagnostic and treatment options will need to occur with no improvement. Suspect that your recent activities and positioning have irritated some nerve roots perhaps as far as your neck area used this provided sling for 3-5 days as discussed. If new symptoms such as headaches, numbness, tingling loss of facial expression, muscular weakness should go to [facility] department for further evaluation. Should let you know your Dr. if your symptoms improve with partial and temporary immobilization.    Radicular Pain Radicular pain in either the arm or leg is usually from a bulging or herniated disk in the spine. A piece of the herniated disk may press against the nerves as the nerves exit the spine. This causes pain which is felt at the tips of the nerves down the arm or leg. Other causes of radicular pain may include:  Fractures.   Heart disease.   Cancer.   An abnormal and usually degenerative state of the nervous system or nerves (neuropathy).  Diagnosis may require CT or MRI scanning to determine the primary cause.  Nerves that start at the neck (nerve roots) may cause radicular pain in the outer shoulder and arm. It can spread down to the thumb and fingers. The symptoms vary depending on which nerve root has been affected. In most cases radicular pain improves with conservative treatment. Neck problems may require physical therapy, a neck collar, or cervical traction. Treatment may take many weeks, and surgery may be considered if the symptoms do not improve.  Conservative treatment is also recommended for sciatica. Sciatica causes pain to radiate from the lower back or buttock area down the leg into the foot. Often there is a history of back problems. Most patients with sciatica are better after 2 to 4 weeks of rest and other supportive care. Short term bed rest can reduce the disk pressure  considerably. Sitting, however, is not a good position since this increases the pressure on the disk. You should avoid bending, lifting, and all other activities which make the problem worse. Traction can be used in severe cases. Surgery is usually reserved for patients who do not improve within the first months of treatment. Only take over-the-counter or prescription medicines for pain, discomfort, or fever as directed by your caregiver. Narcotics and muscle relaxants may help by relieving more severe pain and spasm and by providing mild sedation. Cold or massage can give significant relief. Spinal manipulation is not recommended. It can increase the degree of disc protrusion. Epidural steroid injections are often effective treatment for radicular pain. These injections deliver medicine to the spinal nerve in the space between the protective covering of the spinal cord and back bones (vertebrae). Your caregiver can give you more information about steroid injections. These injections are most effective when given within two weeks of the onset of pain.  You should see your caregiver for follow up care as recommended. A program for neck and back injury rehabilitation with stretching and strengthening exercises is an important part of management.  SEEK IMMEDIATE MEDICAL CARE IF:  You develop increased pain, weakness, or numbness in your arm or leg.   You develop difficulty with bladder or bowel control.   You develop abdominal pain.  Document Released: 06/08/2004 Document Revised: 04/20/2011 Document Reviewed: 08/24/2008 [facility] Patient Information 2012 [place], [place].

## 2011-08-30 NOTE — ED Notes (Addendum)
Under a lot of stress ; her female dog delivered a liter of puppies, then died 3 weeks ago, she has had to bottle feed them, around the clock, falls asleep in strange positions , keeps her shoulders bunched up all the time. C/o pain in neck, worse w any ROM, pain in both shoulders , worse w ROM; pain mid left forearm; denies any injury to account for her symptoms. Also c/o left hand weakness, unable to perform good ROM w her wrist. 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

## 2011-08-30 NOTE — ED Provider Notes (Addendum)
History     CSN: [id number]  Arrival date & time 08/30/11  1114   First MD Initiated Contact with Patient 08/30/11 1245      Chief Complaint  Patient presents with  . Muscle Pain    (Consider location/radiation/quality/duration/timing/severity/associated sxs/prior treatment) HPI Comments: For about 3 weeks been experiencing pains from both shoulders and part of her neck, in the past few days her left elbow , and shooting down to her left writs, her wrist feels "weaker", like i can move it completely. Movement of my wrist and elbow make the pain worse, i can grab things but i cant,squeze things".   She has been taking care of a litter of puppies, have fallen asleep in very uncomfortable positions, as i tried to feed them, even while Im sitting, i keep my shoulders, Im sore all over my lower legs, have become swollen and red at times.  No falls, or injuries, no fevers. No paresthesias, no speech problems or changes no facial changes or speech or balance problems. No HA.  Patient is a 61 y.o. female presenting with musculoskeletal pain. The history is provided by the patient.  Muscle Pain This is a recurrent problem. The current episode started more than 1 week ago. The problem occurs constantly. The problem has not changed since onset.Pertinent negatives include no chest pain, no abdominal pain, no headaches and no shortness of breath. The symptoms are aggravated by twisting. The symptoms are relieved by rest. She has tried nothing for the symptoms. The treatment provided no relief.    Past Medical History  Diagnosis Date  . Crohn's disease 1980's     diagnosis made in 1980s, status post multiple surgeries including ileostomy and total colectomy; November 29 2009 single contrast ileostomy enema performed and normal ileostomy study following total colectomy was noted with no evidence of Crohn's dz;  Marland Kitchen Depression   . Substance abuse     tobacco  . Insomnia   . 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 Colectomy 1980's     status post colectomy with right lower quadrant ileostomy, this was performed in 1980s and we have no details of the procedures,  however this was confirmed by multiple CTs of the abdomen and pelvis, last CT of the abdomen and pelvis June 18, 2010 showed no evidence of active Crohn's disease or complications  . Laparoscopy 04/13/2010     diagnostic laparoscopy, lysis of adhesions and small bowel resection with ileostomy revision, done by Dr. Dwain Sarna;  postoperative diagnoses =  difficulty with ileostomy and ileostomy stricture,  pathology report April 13, 2010 -->  ileostomy with focal ulceration, granulation tissue and fibrosis  . Colon stricture dilatation   . Bowel resection   . Tubal ligation   . Oophorectomy     Family History  Problem Relation Age of Onset  . Crohn's disease Brother   . Cancer Father     lung  . Heart disease Father     heart attack    History  Substance Use Topics  . Smoking status: Current Everyday Smoker -- 1.0 packs/day for 30 years    Types: Cigarettes  . Smokeless tobacco: Never Used   Comment: Has cut down from 2 packs to 1  . Alcohol Use: No    OB History    Grav Para Term Preterm Abortions TAB SAB Ect Mult Living  Review of Systems  Constitutional: Positive for activity change. Negative for fever, chills, diaphoresis and appetite change.  HENT: Positive for neck pain and neck stiffness.   Eyes: Negative for visual disturbance.  Respiratory: Negative for cough, chest tightness and shortness of breath.   Cardiovascular: Negative for chest pain, palpitations and leg swelling.  Gastrointestinal: Negative for abdominal pain.  Skin: Negative for rash.  Neurological: Negative for headaches.    Allergies  Codeine and Tramadol  Home Medications   Current Outpatient Rx  Name Route Sig Dispense Refill  .  ALENDRONATE SODIUM 70 MG PO TABS Oral Take 1 tablet (70 mg total) by mouth every 7 (seven) days. Take in the morning with a full glass of water, on an empty stomach, and do not take anything else by mouth or lie down for the next 30 min. 4 tablet 7  . ASPIRIN 325 MG PO TABS Oral Take 325 mg by mouth every 6 (six) hours as needed.      . BUPROPION HCL ER (SR) 150 MG PO TB12 Oral Take 1 tablet (150 mg total) by mouth 2 (two) times daily. 60 tablet 3  . CLONAZEPAM 0.5 MG PO TABS Oral Take 1 tablet (0.5 mg total) by mouth 2 (two) times daily as needed for anxiety. 30 tablet 0  . FENTANYL 12 MCG/HR TD PT72 Transdermal Place 1 patch onto the skin every other day.      Marland Kitchen METOPROLOL SUCCINATE ER 25 MG PO TB24 Oral Take 1 tablet (25 mg total) by mouth daily. 30 tablet 5  . TRAMADOL HCL 50 MG PO TABS  TAKE 1-2 TABLETS BY MOUTH EVERY 6 HOURS AS NEEDED FOR PAIN 120 tablet 0  . ZOLPIDEM TARTRATE 10 MG PO TABS Oral Take 1 tablet (10 mg total) by mouth at bedtime as needed for sleep. 30 tablet 5    BP 90/57  Pulse 85  Temp(Src) 98 F (36.7 C) (Oral)  Resp 18  SpO2 100%  LMP 01/26/1990  Physical Exam  Nursing note and vitals reviewed. Constitutional: She is oriented to person, place, and time. She appears well-developed and well-nourished.  Non-toxic appearance. She does not have a sickly appearance. She does not appear ill. No distress.  HENT:  Mouth/Throat: No oropharyngeal exudate.  Pulmonary/Chest: Effort normal. No respiratory distress.  Musculoskeletal: She exhibits tenderness. She exhibits no edema.  Neurological: She is alert and oriented to person, place, and time. No cranial nerve deficit or sensory deficit. She exhibits normal muscle tone. Coordination and gait normal.  Skin: No abrasion and no rash noted. She is not diaphoretic. No erythema.       ED Course  Procedures (including critical care time)  Labs Reviewed - No data to display No results found.   1. Neuralgia of upper  extremity       MDM  Cervical sprains and strains with associated neuralgia. Patient with significant recent physical efforts. Patient was noted with a somewhat limited ROM of L wrist adduction. `peripheral and distal pulses WNL. No further symptoms to suggest a central neurological event.Symptomatic management with immobilization, possibly ulnar type neuralgia from recent positioning changes. Also considering a cervical neuralgia.Patient instructed to follow-up early next week with the internal medicine clinic, patient agreed to follow-up with clinic        Jimmie Molly, MD 08/30/11 1726  Jimmie Molly, MD 08/30/11 1726

## 2011-09-06 ENCOUNTER — Ambulatory Visit (INDEPENDENT_AMBULATORY_CARE_PROVIDER_SITE_OTHER): Payer: Medicare Other | Admitting: Internal Medicine

## 2011-09-06 ENCOUNTER — Encounter: Payer: Self-pay | Admitting: Internal Medicine

## 2011-09-06 VITALS — BP 113/69 | HR 80 | Temp 97.0°F | Ht 65.0 in | Wt 108.1 lb

## 2011-09-06 DIAGNOSIS — L0390 Cellulitis, unspecified: Secondary | ICD-10-CM | POA: Insufficient documentation

## 2011-09-06 DIAGNOSIS — Z634 Disappearance and death of family member: Secondary | ICD-10-CM

## 2011-09-06 MED ORDER — CLONAZEPAM 0.5 MG PO TABS: 0.5000 mg | ORAL_TABLET | Freq: Two times a day (BID) | ORAL | Status: DC | PRN

## 2011-09-06 MED ORDER — CLINDAMYCIN HCL 300 MG PO CAPS: 300.0000 mg | ORAL_CAPSULE | Freq: Three times a day (TID) | ORAL | Status: DC

## 2011-09-06 NOTE — Assessment & Plan Note (Signed)
The patient continues to note feelings of sadness and anxiety following the death of her dog.  She notes no significant social support network of friends/family.  Clonazepam has helped her cope. -continue clonazepam for 2 more weeks, while the puppies are bottle feeding -we discussed that clonazepam will not be continued long-term

## 2011-09-06 NOTE — Patient Instructions (Signed)
For your anxiety while your puppies are bottle feeding, we will continue your clonazepam for an additional 2 weeks.  Take 1 tablet twice per day as needed for anxiety.  For your cellulitis of your left foot, take Clindamycin, 3 times per day for 7 days.  Please return for a follow-up visit in 3 months.

## 2011-09-06 NOTE — Assessment & Plan Note (Signed)
The patient presents with a left foot with edema, erythema, and pain, though without warmth or purulence.  This most likely represents cellulitis. -clinda 300 mg TID x7 days -pt instructed to return to care if area does not improve.

## 2011-09-06 NOTE — Progress Notes (Signed)
HPI The patient is a 61 yo woman, history of crohn's s/p ilectomy/partial colectomy with no recurrence of symptoms, and significant depression and anxiety, presenting for a follow-up visit.  The patient was seen 2 weeks ago, and noted significant life stress over the death of her dog, and the responsibility of bottle feeding a litter of 10 puppies.  She was prescribed a short-term course of clonazepam, which has helped her deal with her anxiety.  She requests that this medication be continued for the final 2 weeks of the puppies' bottle feeding, with the understanding that this medication can be habit-forming and will not be continued past this point.  The patient also notes a 1-week history of left foot redness and swelling, with no inciting scratch or injury, and no purulence or drainage.  The patient is afebrile, without tachycardia.  She notes no nausea, vomiting, SOB, cp, palpitations.  ROS: General: no fevers, chills, changes in weight Skin: no rash HEENT: no blurry vision, hearing changes, sore throat Pulm: no dyspnea, coughing, wheezing CV: no chest pain, palpitations, shortness of breath Abd: no abdominal pain, nausea/vomiting, diarrhea/constipation GU: no dysuria, hematuria, polyuria Ext: see HPI Neuro: no weakness, numbness, or tingling  Filed Vitals:   09/06/11 1605  BP: 113/69  Pulse: 80  Temp: 97 F (36.1 C)    PEX General: alert, cooperative, and in no apparent distress HEENT: pupils equal round and reactive to light, vision grossly intact, oropharynx clear and non-erythematous  Neck: supple, no lymphadenopathy Lungs: clear to ascultation bilaterally, normal work of respiration, no wheezes, rales, ronchi Heart: regular rate and rhythm, no murmurs, gallops, or rubs Abdomen: soft, non-tender, non-distended, normal bowel sounds Extremities: Left foot with erythema and edema, tender to palpation, but no warmth or purulence.  Right foot with mild hyperpigmentation  consistent with chronic venous insufficiency, but no edema. Neurologic: alert & oriented X3, cranial nerves II-XII intact, strength grossly intact, sensation intact to light touch  Assessment/Plan

## 2011-09-11 ENCOUNTER — Emergency Department (HOSPITAL_COMMUNITY)
Admission: EM | Admit: 2011-09-11 | Discharge: 2011-09-11 | Disposition: A | Discharge status: Home / Self Care | Payer: Medicare Other | Attending: Emergency Medicine | Admitting: Emergency Medicine

## 2011-09-11 ENCOUNTER — Encounter (HOSPITAL_COMMUNITY): Payer: Self-pay | Admitting: Emergency Medicine

## 2011-09-11 DIAGNOSIS — Z932 Ileostomy status: Secondary | ICD-10-CM | POA: Insufficient documentation

## 2011-09-11 DIAGNOSIS — R109 Unspecified abdominal pain: Secondary | ICD-10-CM | POA: Insufficient documentation

## 2011-09-11 LAB — BASIC METABOLIC PANEL
Calcium: 9.1 mg/dL (ref 8.4–10.5)
Creatinine, Ser: 0.7 mg/dL (ref 0.50–1.10)
GFR calc non Af Amer: >90 mL/min (ref >90)
Glucose, Bld: 80 mg/dL (ref 70–99)
Sodium: 136 mEq/L (ref 135–145)

## 2011-09-11 LAB — DIFFERENTIAL
Basophils Relative: 1 % (ref 0–1)
Eosinophils Absolute: 0.2 10*3/uL (ref 0.0–0.7)
Monocytes Absolute: 0.4 10*3/uL (ref 0.1–1.0)
Neutrophils Relative %: 44 % (ref 43–77)

## 2011-09-11 LAB — CBC
MCH: 30.5 pg (ref 26.0–34.0)
MCHC: 34.7 g/dL (ref 30.0–36.0)
Platelets: 59 10*3/uL — ABNORMAL LOW (ref 150–400)

## 2011-09-11 MED ORDER — HYDROMORPHONE HCL PF 1 MG/ML IJ SOLN
1.0000 mg | Freq: Once | INTRAMUSCULAR | Status: AC
  Administered 2011-09-11: 1 mg via INTRAMUSCULAR
  Filled 2011-09-11: qty 1

## 2011-09-11 MED ORDER — MORPHINE SULFATE 4 MG/ML IJ SOLN
4.0000 mg | Freq: Once | INTRAMUSCULAR | Status: AC
  Administered 2011-09-11: 4 mg via INTRAMUSCULAR
  Filled 2011-09-11: qty 1

## 2011-09-11 NOTE — ED Provider Notes (Addendum)
History     CSN: [id number]  Arrival date & time 09/11/11  1237   First MD Initiated Contact with Patient 09/11/11 1418      Chief Complaint  Patient presents with  . Abdominal Pain    (Consider location/radiation/quality/duration/timing/severity/associated sxs/prior treatment) Patient is a 61 y.o. female presenting with abdominal pain. The history is provided by the patient.  Abdominal Pain The primary symptoms of the illness include abdominal pain. The primary symptoms of the illness do not include fever, shortness of breath, nausea, vomiting or dysuria.  Symptoms associated with the illness do not include chills. Associated symptoms comments: She has an ileostomy placed 30 years ago secondary to Crohn's disease. She reports chronic pain limited to the stoma for which she goes to Pain Management. Over the last one week, the pain has been unmanaged with her Duragesic patch. No fever, nausea, change in appetite, dysuria or bloody bowel movement..    Past Medical History  Diagnosis Date  . Crohn's disease 1980's     diagnosis made in 1980s, status post multiple surgeries including ileostomy and total colectomy; November 29 2009 single contrast ileostomy enema performed and normal ileostomy study following total colectomy was noted with no evidence of Crohn's dz;  Marland Kitchen Depression   . Substance abuse     tobacco  . Insomnia   . 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 Colectomy 1980's     status post colectomy with right lower quadrant ileostomy, this was performed in 1980s and we have no details of the procedures,  however this was confirmed by multiple CTs of the abdomen and pelvis, last CT of the abdomen and pelvis June 18, 2010 showed no evidence of active Crohn's disease or complications  . Laparoscopy 04/13/2010     diagnostic laparoscopy, lysis of adhesions and small bowel resection with ileostomy revision,  done by Dr. Dwain Sarna;  postoperative diagnoses =  difficulty with ileostomy and ileostomy stricture,  pathology report April 13, 2010 -->  ileostomy with focal ulceration, granulation tissue and fibrosis  . Colon stricture dilatation   . Bowel resection   . Tubal ligation   . Oophorectomy     Family History  Problem Relation Age of Onset  . Crohn's disease Brother   . Cancer Father     lung  . Heart disease Father     heart attack    History  Substance Use Topics  . Smoking status: Current Everyday Smoker -- 1.0 packs/day for 30 years    Types: Cigarettes  . Smokeless tobacco: Never Used   Comment: Has cut down from 2 packs to 1  . Alcohol Use: No    OB History    Grav Para Term Preterm Abortions TAB SAB Ect Mult Living                  Review of Systems  Constitutional: Negative for fever and chills.  HENT: Negative.   Respiratory: Negative.  Negative for shortness of breath.   Cardiovascular: Negative.  Negative for chest pain.  Gastrointestinal: Positive for abdominal pain. Negative for nausea and vomiting.  Genitourinary: Negative for dysuria.  Musculoskeletal: Negative.   Skin: Negative.   Neurological: Negative.     Allergies  Codeine and Tramadol  Home Medications   Current Outpatient Rx  Name Route Sig Dispense Refill  . ALENDRONATE SODIUM 70 MG PO TABS Oral Take 1 tablet (70 mg total)  by mouth every 7 (seven) days. Take in the morning with a full glass of water, on an empty stomach, and do not take anything else by mouth or lie down for the next 30 min. 4 tablet 7  . ASPIRIN 325 MG PO TABS Oral Take 325 mg by mouth daily.     Marland Kitchen CLONAZEPAM 0.5 MG PO TABS Oral Take 1 tablet (0.5 mg total) by mouth 2 (two) times daily as needed for anxiety. 45 tablet 0  . FENTANYL 12 MCG/HR TD PT72 Transdermal Place 1 patch onto the skin every other day.      Marland Kitchen GABAPENTIN 100 MG PO CAPS Oral Take 3 tablets by mouth Three times a day.    Marland Kitchen METOPROLOL SUCCINATE ER 25 MG  PO TB24 Oral Take 1 tablet (25 mg total) by mouth daily. 30 tablet 5  . TRAMADOL HCL 50 MG PO TABS  TAKE 1-2 TABLETS BY MOUTH EVERY 6 HOURS AS NEEDED FOR PAIN 120 tablet 0  . ZOLPIDEM TARTRATE 10 MG PO TABS Oral Take 1 tablet (10 mg total) by mouth at bedtime as needed for sleep. 30 tablet 5    BP 95/58  Pulse 58  Temp(Src) 97.7 F (36.5 C) (Oral)  Resp 16  SpO2 97%  LMP 01/26/1990  Physical Exam  Constitutional: She appears well-developed and well-nourished.  HENT:  Head: Normocephalic.  Neck: Normal range of motion.  Cardiovascular: Normal rate.   No murmur heard. Pulmonary/Chest: Effort normal and breath sounds normal.  Abdominal:       Soft abdomen without distension. BS positive all four quadrants. Ileostomy draining brown stool, stoma unremarkable without redness. Tender to palpation focally at stoma without palpable abdominal pain.     ED Course  Procedures (including critical care time)   Labs Reviewed  CBC  DIFFERENTIAL  BASIC METABOLIC PANEL   Results for orders placed during the hospital encounter of 09/11/11  CBC      Component Value Range   WBC 4.3  4.0 - 10.5 (K/uL)   RBC 4.62  3.87 - 5.11 (MIL/uL)   Hemoglobin 14.1  12.0 - 15.0 (g/dL)   HCT 78.2  95.6 - 21.3 (%)   MCV 87.9  78.0 - 100.0 (fL)   MCH 30.5  26.0 - 34.0 (pg)   MCHC 34.7  30.0 - 36.0 (g/dL)   RDW 08.6  57.8 - 46.9 (%)   Platelets 59 (*) 150 - 400 (K/uL)  DIFFERENTIAL      Component Value Range   Neutrophils Relative 44  43 - 77 (%)   Lymphocytes Relative 42  12 - 46 (%)   Monocytes Relative 9  3 - 12 (%)   Eosinophils Relative 4  0 - 5 (%)   Basophils Relative 1  0 - 1 (%)   Neutro Abs 1.9  1.7 - 7.7 (K/uL)   Lymphs Abs 1.8  0.7 - 4.0 (K/uL)   Monocytes Absolute 0.4  0.1 - 1.0 (K/uL)   Eosinophils Absolute 0.2  0.0 - 0.7 (K/uL)   Basophils Absolute 0.0  0.0 - 0.1 (K/uL)   WBC Morphology ATYPICAL LYMPHOCYTES    BASIC METABOLIC PANEL      Component Value Range   Sodium 136  135 -  145 (mEq/L)   Potassium 4.1  3.5 - 5.1 (mEq/L)   Chloride 103  96 - 112 (mEq/L)   CO2 23  19 - 32 (mEq/L)   Glucose, Bld 80  70 - 99 (mg/dL)   BUN 6  6 - 23 (mg/dL)   Creatinine, Ser 1.61  0.50 - 1.10 (mg/dL)   Calcium 9.1  8.4 - 09.6 (mg/dL)   GFR calc non Af Amer >90  >90 (mL/min)   GFR calc Af Amer >90  >90 (mL/min)    No results found.   No diagnosis found.  1. Chronic abdominal pain 2. 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 Will check basic lab studies and, if stable, anticipate discharge home without need for imaging. Pain addressed with IM morphine.        Rodena Medin, PA-C 09/11/11 1505  Rodena Medin, PA-C 09/11/11 1635

## 2011-09-11 NOTE — Discharge Instructions (Signed)
YOUR LAB STUDIES ARE ESSENTIALLY NORMAL AND YOU CAN BE DISCHARGED HOME AND SHOULD FOLLOW UP WITH YOUR DOCTOR FOR RECHECK IN 1-2 DAYS. CONTINUE YOUR CURRENT MEDICATION REGIMEN.

## 2011-09-11 NOTE — ED Provider Notes (Signed)
Medical screening examination/treatment/procedure(s) were performed by non-physician practitioner and as supervising physician I was immediately available for consultation/collaboration.   Nat Christen, MD 09/11/11 [phone]

## 2011-09-11 NOTE — ED Notes (Signed)
Has had a lot of abd  pain  Some nausea no vomiting has been to pain management   Denies dysuria

## 2011-09-11 NOTE — ED Notes (Signed)
Pt was medicated as ordered for the pain. BP=100/57, HR=60, O2 saturation= 100%

## 2011-09-12 NOTE — ED Provider Notes (Signed)
Medical screening examination/treatment/procedure(s) were performed by non-physician practitioner and as supervising physician I was immediately available for consultation/collaboration.   Nat Christen, MD 09/12/11 [phone]

## 2011-12-07 ENCOUNTER — Ambulatory Visit (INDEPENDENT_AMBULATORY_CARE_PROVIDER_SITE_OTHER): Payer: Medicare Other | Admitting: Internal Medicine

## 2011-12-07 ENCOUNTER — Ambulatory Visit (HOSPITAL_COMMUNITY)
Admission: RE | Admit: 2011-12-07 | Discharge: 2011-12-07 | Disposition: A | Discharge status: Home / Self Care | Payer: Medicare Other | Source: Ambulatory Visit | Attending: Internal Medicine | Admitting: Internal Medicine

## 2011-12-07 ENCOUNTER — Telehealth: Payer: Self-pay | Admitting: *Deleted

## 2011-12-07 VITALS — BP 102/65 | HR 65 | Temp 97.4°F | Resp 20 | Ht 64.0 in | Wt 96.8 lb

## 2011-12-07 DIAGNOSIS — F17200 Nicotine dependence, unspecified, uncomplicated: Secondary | ICD-10-CM | POA: Insufficient documentation

## 2011-12-07 DIAGNOSIS — F4321 Adjustment disorder with depressed mood: Secondary | ICD-10-CM

## 2011-12-07 DIAGNOSIS — R634 Abnormal weight loss: Secondary | ICD-10-CM | POA: Insufficient documentation

## 2011-12-07 DIAGNOSIS — K5090 Crohn's disease, unspecified, without complications: Secondary | ICD-10-CM

## 2011-12-07 DIAGNOSIS — M810 Age-related osteoporosis without current pathological fracture: Secondary | ICD-10-CM

## 2011-12-07 DIAGNOSIS — I4581 Long QT syndrome: Secondary | ICD-10-CM

## 2011-12-07 DIAGNOSIS — R05 Cough: Secondary | ICD-10-CM | POA: Insufficient documentation

## 2011-12-07 DIAGNOSIS — Z634 Disappearance and death of family member: Secondary | ICD-10-CM

## 2011-12-07 DIAGNOSIS — J449 Chronic obstructive pulmonary disease, unspecified: Secondary | ICD-10-CM | POA: Insufficient documentation

## 2011-12-07 DIAGNOSIS — R059 Cough, unspecified: Secondary | ICD-10-CM | POA: Insufficient documentation

## 2011-12-07 DIAGNOSIS — J4489 Other specified chronic obstructive pulmonary disease: Secondary | ICD-10-CM | POA: Insufficient documentation

## 2011-12-07 DIAGNOSIS — G4700 Insomnia, unspecified: Secondary | ICD-10-CM

## 2011-12-07 DIAGNOSIS — R109 Unspecified abdominal pain: Secondary | ICD-10-CM

## 2011-12-07 DIAGNOSIS — I10 Essential (primary) hypertension: Secondary | ICD-10-CM

## 2011-12-07 DIAGNOSIS — E46 Unspecified protein-calorie malnutrition: Secondary | ICD-10-CM

## 2011-12-07 MED ORDER — ALBUTEROL SULFATE HFA 108 (90 BASE) MCG/ACT IN AERS: 2.0000 | INHALATION_SPRAY | Freq: Four times a day (QID) | RESPIRATORY_TRACT | Status: DC | PRN

## 2011-12-07 MED ORDER — BENZONATATE 100 MG PO CAPS: 100.0000 mg | ORAL_CAPSULE | Freq: Three times a day (TID) | ORAL | Status: DC | PRN

## 2011-12-07 MED ORDER — ZOLPIDEM TARTRATE 10 MG PO TABS: 10.0000 mg | ORAL_TABLET | Freq: Every evening | ORAL | Status: DC | PRN

## 2011-12-07 MED ORDER — SULFAMETHOXAZOLE-TRIMETHOPRIM 800-160 MG PO TABS: 1.0000 | ORAL_TABLET | Freq: Two times a day (BID) | ORAL | Status: DC

## 2011-12-07 MED ORDER — METOPROLOL SUCCINATE ER 25 MG PO TB24: 25.0000 mg | ORAL_TABLET | Freq: Every day | ORAL | Status: DC

## 2011-12-07 NOTE — Telephone Encounter (Signed)
Pt calls and states she developed a cold appr 1 month ago, had a cold w/ fever, after 1 wk she was better except for a lingering cough, mostly dry but occasional thick clear to green mucous. No fevers since the initial 1st week. For 3 days to 1 week now she has coughing episodes where she cannot get her breath for several minutes afterward, the cough continues to be mostly dry in nature but still occ. Clear to green mucous. During these episodes she stops all activity and cannot resume for several minutes. She spoke clearly and i did not note any shortness of breath, resp distress while speaking. She denies h/a's, n&v, weakness, visual changes, any/ all other symptoms. appt is given for 1530 dr Garald Braver

## 2011-12-07 NOTE — Telephone Encounter (Signed)
Agree with plan 

## 2011-12-07 NOTE — Patient Instructions (Addendum)
--  Follow up with in one month, call tomorrow to make an appointment --Call us if your symptoms worsen, if you have fever, chills, or your cough is worse. --Call 1800-QUIT-NOW for more information on how to quit smoking.

## 2011-12-08 ENCOUNTER — Telehealth: Payer: Self-pay | Admitting: *Deleted

## 2011-12-08 ENCOUNTER — Encounter: Payer: Self-pay | Admitting: Internal Medicine

## 2011-12-08 DIAGNOSIS — R05 Cough: Secondary | ICD-10-CM | POA: Insufficient documentation

## 2011-12-08 MED ORDER — METOPROLOL SUCCINATE ER 25 MG PO TB24: ORAL_TABLET | ORAL | Status: DC

## 2011-12-08 MED ORDER — SULFAMETHOXAZOLE-TRIMETHOPRIM 800-160 MG PO TABS: 1.0000 | ORAL_TABLET | Freq: Two times a day (BID) | ORAL | Status: AC

## 2011-12-08 NOTE — Progress Notes (Signed)
I saw patient and discussed her care with resident Dr. Garald Braver.  I agree with the clinical findings and plans as outlined in her note, with the following additional comments.  Patient describes episodes of paroxysmal coughing, and I agree with empiric antibiotic treatment with Bactrim DS which will cover possible adult pertussis.  She would likely benefit from pulmonary function testing once her symptoms have resolved.  Smoking cessation is a priority, and this issue should be revisited at her next visit.

## 2011-12-08 NOTE — Assessment & Plan Note (Addendum)
Likely secondary to Crohn's disease. Patient has lost 12 pounds since last visit in April 2013. Would refer her to a nutritionist during her next visit.

## 2011-12-08 NOTE — Assessment & Plan Note (Signed)
Pt states that this has resolved.

## 2011-12-08 NOTE — Assessment & Plan Note (Signed)
No complaints today.

## 2011-12-08 NOTE — Assessment & Plan Note (Addendum)
Patient with BMI of 16, concern for severe malnutrition. Would assess attrition status her next visit. May need to be placed on fat soluble vitamin supplementation.

## 2011-12-08 NOTE — Assessment & Plan Note (Signed)
Counseled patient on need for smoking cessation. Patient states she's not ready to quit. Referred her to 1-800-QUIT-NOW for more information on smoking cessation. Will reassess patient's smoking cessation readiness to next visit.

## 2011-12-08 NOTE — Telephone Encounter (Signed)
--  Per Pharmacy, Misty [name] as well as any cough suppressant not covered by Medicare, would cost >$50, generic $22 (for 30).  --Changed metoprolol order to metoprolol XL 25mg  q8hrs as previously prescribed.  --Called in Ambien 10mg  qHS PRN for insomnia --Called pt at 5:30PM she is unable to afford this medication. She does not tolerate liquid medications, advised her to talk to pharmacist for low cost OTC cough supressant

## 2011-12-08 NOTE — Assessment & Plan Note (Signed)
No recent history of fractures. Patient reports not taking her alendronate. Advised her to resume therapy. Would reassess medication compliance her next visit.

## 2011-12-08 NOTE — Progress Notes (Signed)
Rx for generic Ambien called into [facility] [place] - never received Rx. Stanton Kidney Benino Korinek RN 12/08/11 4:30PM

## 2011-12-08 NOTE — Assessment & Plan Note (Addendum)
Ordered CXR stat. CHEST - 2 VIEW. IMPRESSION:  Underlying COPD with increased prominence of interstitial markings  and central peribronchial cuffing with some subtle interstitial  septal lines. Question mild interstitial edema superimposed on  underlying chronic change.  Patient with prolonged QT syndrome, given description cough concern for pertussis v worsening atypical pneumonia v reactive airway disease. Given radiological findings of emphysema will treat with antibiotics. Will treat with Bactrim DS is 14 days. Patient advised to call if she develops a rash or if her symptoms worsen. Would consider treating her with doxycycline for atypical pneumonia coverage. Patient will need PFT once current symptoms are resolved.  Bactrim DS BID x 14 days.   Albuterol inhaler 2 puffs 4 times daily PRN shortness of breath  Tessalon Perles 100 mg 3 times daily PRN cough  Followup in a month.  Addendum:  --Per Pharmacy, tessalon Perles as well as any cough suppressant not covered by Medicare, would cost >$50, generic $22 (for 30).   --Called pt, she is unable to afford this medication. She does not take liquid medications, advised her to talk to pharmacist for low cost OTC cough supressant.

## 2011-12-08 NOTE — Progress Notes (Signed)
  Subjective:    Patient ID: Misty [name], female    DOB: 1950-10-20, 61 y.o.   MRN: [id number]  HPI Misty [name] is a 61 year old woman with past medical history significant for prolonged QT syndrome, tobacco abuse, Crohn's disease, and malnutrition who comes in today for evaluation of a cold for about a month and a dry cough for about 3 weeks. One month ago she had stuffy nose, sore throat, subjective fever, and rhinorrhea with occasional cough productive of clear sputum. Her symptoms subsided for her cough persisted and gradually became nonproductive. She decreased describes the cough is becoming intense lately, leading to coughing spells that caused her to gasp for air. In addition, she has decreased appetite and difficulty sleeping. Currently she denies fever, chills, nausea, vomiting, chest pain or tenderness, rib pain, or abdominal pain.  She has had Crohn's disease for years and status post colon resection with stoma and colostomy bag placement. Her stoma is patent and she continues to empty her colostomy bag 4-6 times per day with no changes in stool content.   Review of Systems  Constitutional: Positive for appetite change, fatigue and unexpected weight change. Negative for fever, chills, diaphoresis and activity change.  HENT: Positive for sore throat. Negative for ear pain, congestion, rhinorrhea and postnasal drip.   Eyes: Negative for itching.  Respiratory: Positive for cough and shortness of breath. Negative for choking and chest tightness.   Cardiovascular: Negative for chest pain and palpitations.  Gastrointestinal: Negative for nausea, vomiting, abdominal pain, blood in stool and abdominal distention.  Genitourinary: Negative for difficulty urinating.  Skin: Negative for color change.  Neurological: Negative for dizziness, light-headedness and headaches.  Hematological: Negative for adenopathy.  Psychiatric/Behavioral: Negative for agitation.       Objective:   Physical  Exam  Constitutional: She is oriented to person, place, and time. She appears well-developed. No distress.       Thin, conversant, not coughing during exam.  HENT:  Head: Normocephalic.  Right Ear: External ear normal.  Left Ear: External ear normal.  Nose: Nose normal.  Mouth/Throat: Oropharynx is clear and moist. No oropharyngeal exudate.       Mild peritonsillar erythema likely secondary to coughing.  Eyes: Conjunctivae are normal. Right eye exhibits no discharge. Left eye exhibits no discharge. No scleral icterus.  Neck: Neck supple. No JVD present. No thyromegaly present.  Cardiovascular: Normal rate and intact distal pulses.  Exam reveals no gallop and no friction rub.   No murmur heard. Pulmonary/Chest: Breath sounds normal. No respiratory distress. She has no wheezes. She has no rales. She exhibits no tenderness.       Poor respiratory effort.  Abdominal: Soft. She exhibits no distension and no mass. There is no tenderness. There is no rebound and no guarding.       Stoma over the left upper quadrant, patent, pink, colostomy bag in place with stool content.  Musculoskeletal: She exhibits no edema.  Lymphadenopathy:    She has no cervical adenopathy.  Neurological: She is alert and oriented to person, place, and time.  Skin: Skin is warm and dry. She is not diaphoretic. No erythema.  Psychiatric: She has a normal mood and affect.          Assessment & Plan:

## 2011-12-08 NOTE — Assessment & Plan Note (Signed)
Patient states feeling better not using the Klonopin as she no longer needs it.

## 2011-12-08 NOTE — Telephone Encounter (Signed)
Return pt's call. States she has been taking Metoprolol 25mg  - 1/2 tab TID or q8hrs; new rx states 1 tab daily. Please clarify. Also states Ambien was not called to her pharmacy; [facility].

## 2011-12-20 ENCOUNTER — Encounter (HOSPITAL_COMMUNITY): Payer: Self-pay | Admitting: Psychiatry

## 2011-12-20 ENCOUNTER — Ambulatory Visit (INDEPENDENT_AMBULATORY_CARE_PROVIDER_SITE_OTHER): Payer: Medicare Other | Admitting: Psychiatry

## 2011-12-20 DIAGNOSIS — F4321 Adjustment disorder with depressed mood: Secondary | ICD-10-CM

## 2011-12-20 NOTE — Progress Notes (Signed)
Psychiatric Assessment Adult  Patient Identification:  Misty [name] Date of Evaluation:  12/20/2011 Chief Complaint: Felt depressed History of Chief Complaint:  No chief complaint on file. This patient arrived 20 minutes late for this evaluation. She unfortunately he went to the wrong location.she fully intended to come on time. This patient experienced a time for approximately a month when she fell persistently depressed. This is codirector responsive to the death of her dog, the failure of her car and being evicted. With these multiple psychosocial stressor she became somewhat dysphoric but always remained active and productive. She made arrangements to live in [place] and moved all her animals. She has one dog, 2 cats and 7 periods. When her dog died she inherited the dogs 7 puppies which she then sold.This patient at this time denies daily depression. She denies problems with sleep or appetite. She does take some Ambien which does help. The patient denies problems with thinking and concentration has a good sense of worth. She enjoys life specifically her pets. She this week we'll be getting a new poppy. Presently the patient is not in a relationship she is a divorced woman after 20 years. She does describe significant financial stresses. The patient is on disability for Crohn's disease and presently is in a pain clinic. She referred from per for pain management to be evaluated for depression. The patient openly acknowledges that she was depressed but no longer is the patient denies the use of alcohol. A year or so ago she used marijuana on a regular basis but has stopped since being in the pain clinic. She denies ever been psychotic, having a manic episode or any past episodes of major depression. She denies symptoms consistent with panic disorder generalized anxiety disorder or excessive compulsive disorder. She essentially has no psychiatric history from the past. Results for orders placed during  the hospital encounter of 09/11/11  CBC      Component Value Range   WBC 4.3  4.0 - 10.5 K/uL   RBC 4.62  3.87 - 5.11 MIL/uL   Hemoglobin 14.1  12.0 - 15.0 g/dL   HCT 11.9  14.7 - 82.9 %   MCV 87.9  78.0 - 100.0 fL   MCH 30.5  26.0 - 34.0 pg   MCHC 34.7  30.0 - 36.0 g/dL   RDW 56.2  13.0 - 86.5 %   Platelets 59 (*) 150 - 400 K/uL  DIFFERENTIAL      Component Value Range   Neutrophils Relative 44  43 - 77 %   Lymphocytes Relative 42  12 - 46 %   Monocytes Relative 9  3 - 12 %   Eosinophils Relative 4  0 - 5 %   Basophils Relative 1  0 - 1 %   Neutro Abs 1.9  1.7 - 7.7 K/uL   Lymphs Abs 1.8  0.7 - 4.0 K/uL   Monocytes Absolute 0.4  0.1 - 1.0 K/uL   Eosinophils Absolute 0.2  0.0 - 0.7 K/uL   Basophils Absolute 0.0  0.0 - 0.1 K/uL   WBC Morphology ATYPICAL LYMPHOCYTES    BASIC METABOLIC PANEL      Component Value Range   Sodium 136  135 - 145 mEq/L   Potassium 4.1  3.5 - 5.1 mEq/L   Chloride 103  96 - 112 mEq/L   CO2 23  19 - 32 mEq/L   Glucose, Bld 80  70 - 99 mg/dL   BUN 6  6 - 23 mg/dL  Creatinine, Ser 0.70  0.50 - 1.10 mg/dL   Calcium 9.1  8.4 - 16.1 mg/dL   GFR calc non Af Amer >90  >90 mL/min   GFR calc Af Amer >90  >90 mL/min   patientHPI Review of Systems Physical Exam  Depressive Symptoms: depressed mood,  (Hypo) Manic Symptoms:   Elevated Mood:  No Irritable Mood:  No Grandiosity:  No Distractibility:  No Labiality of Mood:  No Delusions:  No Hallucinations:  No Impulsivity:  No Sexually Inappropriate Behavior:  No Financial Extravagance:  No Flight of Ideas:  No  Anxiety Symptoms: Excessive Worry:  No Panic Symptoms:  No Agoraphobia:  No Obsessive Compulsive: No  Symptoms: None, Specific Phobias:  No Social Anxiety:  No  Psychotic Symptoms:  Hallucinations: No None Delusions:  No Paranoia:  No   Ideas of Reference:  No  PTSD Symptoms: Ever had a traumatic exposure:  No Had a traumatic exposure in the last month:  No Re-experiencing: No  None Hypervigilance:  No Hyperarousal: No None Avoidance: No None  Traumatic Brain Injury: No   Past Psychiatric History: Diagnosis: adjustment disorder with a depressed mood state  Hospitalizations: none  Outpatient Care: none  Substance Abuse Care:   Self-Mutilation:   Suicidal Attempts:   Violent Behaviors:    Past Medical History:   Past Medical History  Diagnosis Date  . Crohn's disease 1980's     diagnosis made in 1980s, status post multiple surgeries including ileostomy and total colectomy; November 29 2009 single contrast ileostomy enema performed and normal ileostomy study following total colectomy was noted with no evidence of Crohn's dz;  Marland Kitchen Depression   . Substance abuse     tobacco  . Insomnia   . 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 Codeine   . Tramadol Nausea And Vomiting   Current Medications:  Current Outpatient Prescriptions  Medication Sig Dispense Refill  . albuterol (PROVENTIL HFA;VENTOLIN HFA) 108 (90 BASE) MCG/ACT inhaler Inhale 2 puffs into the lungs every 6 (six) hours as needed for wheezing.  1 Inhaler  2  . alendronate (FOSAMAX) 70 MG tablet Take 1 tablet (70 mg total) by mouth every 7 (seven) days. Take in the morning with a full glass of water, on an empty stomach, and do not take anything else by mouth or lie down for the next 30 min.  4 tablet  7  . aspirin 325 MG tablet Take 325 mg by mouth daily.       . benzonatate (TESSALON PERLES) 100 MG capsule Take 1 capsule (100 mg total) by mouth 3 (three) times daily as needed for cough.  30 capsule  1  . clonazePAM (KLONOPIN) 0.5 MG tablet Take 1 tablet (0.5 mg total) by mouth 2 (two) times daily as needed for anxiety.  45 tablet  0  . fentaNYL (DURAGESIC - DOSED MCG/HR) 12 MCG/HR Place 1 patch onto the skin every other day.        . gabapentin  (NEURONTIN) 100 MG capsule Take 3 tablets by mouth Three times a day.      . metoprolol succinate (TOPROL-XL) 25 MG 24 hr tablet Take 1/2 tablet by mouth every 8 hours.  30 tablet  5  . traMADol (ULTRAM) 50 MG tablet TAKE 1-2 TABLETS BY MOUTH EVERY 6 HOURS AS NEEDED FOR PAIN  120 tablet  0  . zolpidem (AMBIEN) 10 MG tablet Take 1 tablet (10 mg total) by mouth at bedtime as needed for sleep.  30 tablet  5    Previous Psychotropic Medications:  Medication Dose                          Substance Abuse History in the last 12 months:none                                                                                                   Medical Consequences of Substance Abuse:   Legal Consequences of Substance Abuse:   Family Consequences of Substance Abuse:   Blackouts:   DT's:   Withdrawal Symptoms:   None  Social History: Current Place of Residence: [place] of Birth:  Family Members:  Marital Status:  Divorced Children:   Sons:   Daughters:  Relationships:  Education:  GED Educational Problems/Performance:  Religious Beliefs/Practices:   History of Abuse: none Teacher, music History:  None. Legal History:  Hobbies/Interests:   Family History:   Family History  Problem Relation Age of Onset  . Crohn's disease Brother   . Cancer Father     lung  . Heart disease Father     heart attack    Mental Status Examination/Evaluation: Objective:  Appearance: Casual  Eye Contact::  Good  Speech:  Clear and Coherent  Volume:  Normal  Mood:   normal  Affect:  Appropriate  Thought Process:  Logical  Orientation:  Full  Thought Content:  WDL  Suicidal Thoughts:  No  Homicidal Thoughts:  No  Judgement:  Good  Insight:  Good  Psychomotor Activity:  Normal  Akathisia:  No  Handed:  Right  AIMS (if indicated):    Assets:  Communication Skills    Laboratory/X-Ray Psychological Evaluation(s)        Assessment:  Axis  I: Adjustment Disorder with Depressed Mood  AXIS I Adjustment Disorder with Depressed Mood  AXIS II No diagnosis  AXIS III Past Medical History  Diagnosis Date  . Crohn's disease 1980's     diagnosis made in 1980s, status post multiple surgeries including ileostomy and total colectomy; November 29 2009 single contrast ileostomy enema performed and normal ileostomy study following total colectomy was noted with no evidence of Crohn's dz;  Marland Kitchen Depression   . Substance abuse     tobacco  . Insomnia   . Polymorphic ventricular tachycardia     drug-induced QT prolongation     AXIS IV economic problems  AXIS V 61-70 mild symptoms   Treatment Plan/Recommendations:  Plan of Care:this patient at this time had a transient situational response of being depressed due to multiple psychosocial stresses. At this time her mood is stable and she is doing quite well. There is no indication for any psychiatric interventions. She was asked to return in the futureif she felt it was necessary.  Laboratory:    Psychotherapy:   Medications:   Routine PRN Medications:  No  Consultations:    Safety Concerns:  Other:      Lucas Mallow, MD 8/7/20134:04 PM

## 2012-01-17 ENCOUNTER — Encounter: Payer: Medicare Other | Admitting: Internal Medicine

## 2012-03-06 ENCOUNTER — Encounter: Payer: Self-pay | Admitting: Internal Medicine

## 2012-03-12 ENCOUNTER — Ambulatory Visit (INDEPENDENT_AMBULATORY_CARE_PROVIDER_SITE_OTHER): Payer: Medicare Other | Admitting: Nurse Practitioner

## 2012-03-12 ENCOUNTER — Encounter: Payer: Self-pay | Admitting: Nurse Practitioner

## 2012-03-12 VITALS — BP 90/54 | HR 72 | Ht 64.0 in | Wt 100.0 lb

## 2012-03-12 DIAGNOSIS — G8929 Other chronic pain: Secondary | ICD-10-CM

## 2012-03-12 DIAGNOSIS — R109 Unspecified abdominal pain: Secondary | ICD-10-CM

## 2012-03-12 NOTE — Progress Notes (Signed)
03/12/2012 Misty [name] [id number] [birth year]   History of Present Illness:  Patient is a 61 year old female with a history of Crohn's disease, status post remote colectomy/ileostomy. She is referred here by pain clinic for evaluation of stoma pain. Patient had a flexible sigmoidoscopy by Dr. Christella Hartigan in October 2011 for evaluation of pain at ileostomy site. 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 Biopsies of ileum, as well as neoterminal ileum were normal.  Patient was last seen September 2012 at which time she complained of persistent stomal pain. Her CRP was normal at that time. CBC was normal. She was scheduled for a CT enterography of the abdomen and pelvis but test was canceled when she refused to drink oral contrast(see notes in chart 01/17/11). Patient has not been seen here since. Patient has been referred by Pain Management for evaluation of stoma pain.    Patient describes postprandial stomal pain. Pain is superficial, burning in nature. She does not have abdominal pain. Ostomy output is at baseline. No blood in stool. No nausea or vomiting. Patient has had this pain for years. She is very frustrated at lack of diagnoses. She describes the burning pain as shingle-like in nature. Unfortunately gabapentin has not helped. No other gastrointestinal complaints  Current Medications, Allergies, Past Medical History, Past Surgical History, Family History and Social History were reviewed in [employer] record.   Physical Exam: General: Thin, chronically ill appearing white female in no acute distress Head: Normocephalic and atraumatic Eyes:  sclerae anicteric, conjunctiva pink  Ears: Normal auditory acuity Lungs: Clear throughout to auscultation Heart: Regular rate and rhythm Abdomen: Soft, non tender and non distended. Opaque ostomy collection bag in right lower quadrant. Surgical tape slightly  down with yellowish drainage. There are some excoriations / lesions around surgical tape (left side of ostomy). Patient will not let me remove dressing or ostomy for further evaluation. No masses, no hepatomegaly. Normal bowel sounds Musculoskeletal: Symmetrical with no gross deformities  Extremities: No edema  Neurological: Alert oriented x 4, grossly nonfocal Psychological:  Alert and cooperative. Normal mood and affect  Assessment and Recommendations: 1. Remote colectomy / ileostomy for crohn's disease. No active disease at time of last lower endoscopy in 2011.   2. Chronic, postprandial stomal pain. Pain is superficial, stinging in nature and involving only the stoma. Patient denies abdominal pain, nausea, ostomy output changes. This does not sound like an intra-abdominal process. Unfortunately calls of his localized stinging pain is unclear. Topical lidocaine has altered limited relief. Gabapentin is not helping. Patient would not let me examine the ostomy as she does not have another appliance with her and appliances cost $20 every time they are removed/changed. Patient assures me that her ostomy looks completely normal and that there never has been anything unusual looking about stoma. Patient did have some excoriations/questionable rash around that area of the surgical tape but that was several inches away from the stoma itself. Patient explains these lesions have come and gone for years. I offered her an appointment with a wound ostomy nurse at the hospital. She declined for cost reasons.  Unfortunately I don't have much to offer Misty [name] and I was honest with her about this.

## 2012-03-13 ENCOUNTER — Telehealth: Payer: Self-pay | Admitting: *Deleted

## 2012-03-13 MED ORDER — NYSTATIN 100000 UNIT/GM EX POWD: CUTANEOUS | Status: DC

## 2012-03-13 NOTE — Telephone Encounter (Signed)
I return the patient's phone call.  She notes small area of erythema of skin near, but not in contact with, stoma site.  She notes that she suffers from similar symptoms once to twice per year, typically due to irritation from ostomy bag adhesive.  She notes that Dr. Coralee Pesa has prescribed oral antibiotics for this issue over the phone in the past.  Chart review reveals a visit with GI yesterday, who also noted the lesion near the ostomy site, but not in contact with the ostomy or intestinal tissue.  Based on their description, the area sounds more like a fungal infection related to her ostomy site.  I ask the patient to come in for an office visit.  She states that she would prefer not to do this, and would rather try to clear up the lesion with topical agents at home.  While I'd prefer to see the patient, given her description and the description of the physical exam by GI yesterday, I will call in a prescription for diflucan.  Patient informed that if area worsens, or if she develops fevers, chills, to please return to care for a follow-up visit.  Addendum - diflucan associated with prolonged QT, will instead call in nystatin powder

## 2012-03-13 NOTE — Telephone Encounter (Signed)
Having problems again with skin infection at stoma site - wants antibiotics. Has appt with Dr Manson Passey 03/27/12. Uses [facility] in [place]. Has hx of long QT wave. Pt aware Dr Manson Passey will call pt. Stanton Kidney Talia Hoheisel RN 03/13/12 2:30PM

## 2012-03-14 NOTE — Progress Notes (Signed)
i agree with the plan outlined in this note 

## 2012-03-27 ENCOUNTER — Encounter: Payer: Self-pay | Admitting: Internal Medicine

## 2012-03-27 ENCOUNTER — Ambulatory Visit (INDEPENDENT_AMBULATORY_CARE_PROVIDER_SITE_OTHER): Payer: Medicare Other | Admitting: Internal Medicine

## 2012-03-27 VITALS — BP 105/52 | HR 64 | Temp 97.6°F | Ht 64.0 in | Wt 100.8 lb

## 2012-03-27 DIAGNOSIS — R7989 Other specified abnormal findings of blood chemistry: Secondary | ICD-10-CM

## 2012-03-27 DIAGNOSIS — K5090 Crohn's disease, unspecified, without complications: Secondary | ICD-10-CM

## 2012-03-27 DIAGNOSIS — B372 Candidiasis of skin and nail: Secondary | ICD-10-CM

## 2012-03-27 DIAGNOSIS — E039 Hypothyroidism, unspecified: Secondary | ICD-10-CM | POA: Insufficient documentation

## 2012-03-27 DIAGNOSIS — Z1239 Encounter for other screening for malignant neoplasm of breast: Secondary | ICD-10-CM

## 2012-03-27 DIAGNOSIS — G8929 Other chronic pain: Secondary | ICD-10-CM

## 2012-03-27 DIAGNOSIS — E038 Other specified hypothyroidism: Secondary | ICD-10-CM | POA: Insufficient documentation

## 2012-03-27 DIAGNOSIS — G4700 Insomnia, unspecified: Secondary | ICD-10-CM

## 2012-03-27 DIAGNOSIS — Z0000 Encounter for general adult medical examination without abnormal findings: Secondary | ICD-10-CM

## 2012-03-27 DIAGNOSIS — Z1231 Encounter for screening mammogram for malignant neoplasm of breast: Secondary | ICD-10-CM

## 2012-03-27 DIAGNOSIS — R946 Abnormal results of thyroid function studies: Secondary | ICD-10-CM

## 2012-03-27 DIAGNOSIS — M810 Age-related osteoporosis without current pathological fracture: Secondary | ICD-10-CM

## 2012-03-27 DIAGNOSIS — F17200 Nicotine dependence, unspecified, uncomplicated: Secondary | ICD-10-CM

## 2012-03-27 DIAGNOSIS — I4581 Long QT syndrome: Secondary | ICD-10-CM

## 2012-03-27 DIAGNOSIS — R109 Unspecified abdominal pain: Secondary | ICD-10-CM

## 2012-03-27 LAB — CBC
MCH: 31.7 pg (ref 26.0–34.0)
Platelets: 179 10*3/uL (ref 150–400)
RBC: 4.36 MIL/uL (ref 3.87–5.11)
RDW: 12.7 % (ref 11.5–15.5)

## 2012-03-27 LAB — T4, FREE: Free T4: 0.83 ng/dL (ref 0.80–1.80)

## 2012-03-27 MED ORDER — ZOLPIDEM TARTRATE 10 MG PO TABS: 10.0000 mg | ORAL_TABLET | Freq: Every evening | ORAL | Status: DC | PRN

## 2012-03-27 MED ORDER — FLUCONAZOLE 100 MG PO TABS: 100.0000 mg | ORAL_TABLET | Freq: Every day | ORAL | Status: AC

## 2012-03-27 MED ORDER — FLUCONAZOLE 100 MG PO TABS: 100.0000 mg | ORAL_TABLET | Freq: Every day | ORAL | Status: DC

## 2012-03-27 NOTE — Patient Instructions (Signed)
For your skin irritation, we are prescribing Diflucan (see information below on Cutaneous Candidiasis).  Take 1 tablet once per day for 7 days. -if the area does not improve, return for a follow-up visit and we can consider a course of antibiotics.  We are checking several routine labs today, including blood counts, liver tests, and thyroid levels.  We will call you if any results are abnormal.  Please return in 1-2 weeks for a follow-up visit to address your stomal irritation.  If symptoms have resolved, you can instead schedule an appointment in 6 months.  Cutaneous Candidiasis Cutaneous candidiasis is a condition in which there is an overgrowth of yeast (candida) on the skin. Yeast normally live on the skin, but in small enough numbers not to cause any symptoms. In certain cases, increased growth of the yeast may cause an actual yeast infection. This kind of infection usually occurs in areas of the skin that are constantly warm and moist, such as the armpits or the groin. Yeast is the most common cause of diaper rash in babies and in people who cannot control their bowel movements (incontinence). CAUSES  The fungus that most often causes cutaneous candidiasis is Candida albicans. Conditions that can increase the risk of getting a yeast infection of the skin include:  Obesity.  Pregnancy.  Diabetes.  Taking antibiotic medicine.  Taking birth control pills.  Taking steroid medicines.  Thyroid disease.  An iron or zinc deficiency.  Problems with the immune system. SYMPTOMS   Red, swollen area of the skin.  Bumps on the skin.  Itchiness. DIAGNOSIS  The diagnosis of cutaneous candidiasis is usually based on its appearance. Light scrapings of the skin may also be taken and viewed under a microscope to identify the presence of yeast. TREATMENT  Antifungal creams may be applied to the infected skin. In severe cases, oral medicines may be needed.  HOME CARE INSTRUCTIONS   Keep  your skin clean and dry.  Maintain a healthy weight.  If you have diabetes, keep your blood sugar under control. SEEK IMMEDIATE MEDICAL CARE IF:  Your rash continues to spread despite treatment.  You have a fever, chills, or abdominal pain. Document Released: 01/17/2011 Document Revised: 07/24/2011 Document Reviewed: 01/17/2011 [facility] Patient Information 2013 [place], [place].

## 2012-03-27 NOTE — Assessment & Plan Note (Signed)
Continue alendronate.

## 2012-03-27 NOTE — Assessment & Plan Note (Signed)
The patient is currently trying to quit smoking.  We discussed nicotine replacement products, bupropion, and chantix.  Patient would like to try electronic cigarettes first. -will follow-up at next visit

## 2012-03-27 NOTE — Assessment & Plan Note (Addendum)
Borderline elevated TSH noted in the past, with low-normal free T4. -repeat TSH, free T4, free T3  Addendum: Again, labs show borderline elevated TSH with normal free T3 and free T4.  Per uptodate, start synthroid when TSH > 10, or when TSH = 4.5-10 if patient is symptomatic for hypothyroidism.  Patient currently meets neither of those criteria, will observe.

## 2012-03-27 NOTE — Assessment & Plan Note (Signed)
-  will refer for mammogram -patient previously followed at [facility] for pap smears, and would like to re-establish care.  Will send referral

## 2012-03-27 NOTE — Progress Notes (Signed)
HPI The patient is a 61 y.o. female with a history of osteoporosis, crohn's disease, prolonged QT syndrome, presenting for a follow-up visit.  The patient notes a 2-week history of redness, tenderness, itching, and white discharge from an area around her ostomy site.  The patient presented to her gastroenterologist 2 weeks ago with this complaint, and was not given treatment for the area, thought to be simply superficial skin irritation.  Based on the description in the GI note, I was concerned the area may be a candidal infection, and I called in a prescription for nystatin powder to her pharmacy 2 weeks ago, but the patient has been unable to use this medication on the area since the area is directly covered by her ostomy bag, which she only changes once/week.  Symptoms have not improved or worsened since that time.  No fevers, chills.  The patient has been followed at the [facility] clinic for pap smears and mammography.  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  The patient has cut back her smoking from 2 ppd to less than 1 ppd.  She is currently trying to buy electronic cigarettes to further help her quit smoking.  She has tried nicotine patch and gum in the past with no success.  ROS: General: no fevers, chills, changes in weight, changes in appetite Skin: see HPI HEENT: no blurry vision, hearing changes, sore throat Pulm: no dyspnea, coughing, wheezing CV: no chest pain, palpitations, shortness of breath Abd: no abdominal pain, nausea/vomiting, diarrhea/constipation GU: no dysuria, hematuria, polyuria Ext: no arthralgias, myalgias Neuro: no weakness, numbness, or tingling  Filed Vitals:   03/27/12 1545  BP: 105/52  Pulse: 64  Temp: 97.6 F (36.4 C)    PEX General: alert, cooperative, and in no apparent distress HEENT: pupils equal round and reactive to light,  vision grossly intact, oropharynx clear and non-erythematous  Neck: supple, no lymphadenopathy Lungs: clear to ascultation bilaterally, normal work of respiration, no wheezes, rales, ronchi Heart: regular rate and rhythm, no murmurs, gallops, or rubs Abdomen: ostomy site partially covered by ostomy bag which patient does not want to remove.  Small area of erythematous skin, with yellowish crusted exudate, with few satellite lesions nearby, located at the left lower border of ostomy bag adhesive. Extremities: no cyanosis, clubbing, or edema Neurologic: alert & oriented X3, cranial nerves II-XII intact, strength grossly intact, sensation intact to light touch  Current Outpatient Prescriptions on File Prior to Visit  Medication Sig Dispense Refill  . alendronate (FOSAMAX) 70 MG tablet Take 1 tablet (70 mg total) by mouth every 7 (seven) days. Take in the morning with a full glass of water, on an empty stomach, and do not take anything else by mouth or lie down for the next 30 min.  4 tablet  7  . aspirin 325 MG tablet Take 325 mg by mouth daily.       . fentaNYL (DURAGESIC - DOSED MCG/HR) 50 MCG/HR Place 1 patch onto the skin every 3 (three) days.      Marland Kitchen gabapentin (NEURONTIN) 100 MG capsule Take 3 tablets by mouth Three times a day.      Marland Kitchen HYDROcodone-acetaminophen (NORCO) 10-325 MG per tablet Take 1 tablet by mouth every 8 (eight) hours as needed.      . metoprolol succinate (TOPROL-XL) 25 MG 24 hr tablet Take 1/2 tablet  by mouth every 8 hours.  30 tablet  5  . nystatin (MYCOSTATIN) powder Apply to rash around stoma site 3 times daily  30 g  0  . zolpidem (AMBIEN) 10 MG tablet Take 1 tablet (10 mg total) by mouth at bedtime as needed for sleep.  30 tablet  5    Assessment/Plan

## 2012-03-27 NOTE — Assessment & Plan Note (Signed)
The patient's area of erythema on the lower left border of her ostomy bag is concerning for a cutaneous fungal infection.  Will prescribed diflucan.  Patient has a noted history of prolonged QT while on an SSRI, but most recent EKG's show a normal QT interval.  Diflucan has had case reports of prolonged QT, but this typically only occurs with concurrent use of QT-prolonging medications.  As such, I believe it is safe to use this medication in this case. -prescribed diflucan 100 mg daily x7 days -if area does not improve, differential also includes bacterial cellulitis.  Consider clindamycin if symptoms don't improve.

## 2012-03-27 NOTE — Assessment & Plan Note (Signed)
Patient follows with Gastroenterology, appreciate assistance.

## 2012-03-28 LAB — COMPLETE METABOLIC PANEL WITH GFR
ALT: 9 U/L (ref 0–35)
AST: 13 U/L (ref 0–37)
Alkaline Phosphatase: 94 U/L (ref 39–117)
CO2: 24 mEq/L (ref 19–32)
GFR, Est African American: >89 mL/min
Sodium: 135 mEq/L (ref 135–145)
Total Bilirubin: 0.5 mg/dL (ref 0.3–1.2)
Total Protein: 6.8 g/dL (ref 6.0–8.3)

## 2012-04-09 ENCOUNTER — Telehealth: Payer: Self-pay | Admitting: *Deleted

## 2012-04-09 NOTE — Telephone Encounter (Signed)
Pharm calls and states pt insurance will not accept a resident's DEA, need an attending's DEA, i called the zolpidem from 11/13 to them dr brown, wrote but gave them dr butcher's name and DEA, it was accepted

## 2012-04-09 NOTE — Telephone Encounter (Signed)
Thank you :)

## 2012-04-09 NOTE — Telephone Encounter (Signed)
Thanks to you both

## 2012-04-17 ENCOUNTER — Encounter: Payer: Self-pay | Admitting: Obstetrics & Gynecology

## 2012-05-06 ENCOUNTER — Encounter: Payer: Self-pay | Admitting: Obstetrics & Gynecology

## 2012-05-24 ENCOUNTER — Encounter: Payer: Self-pay | Admitting: Obstetrics & Gynecology

## 2012-05-25 ENCOUNTER — Other Ambulatory Visit: Payer: Self-pay | Admitting: Internal Medicine

## 2012-05-28 ENCOUNTER — Ambulatory Visit (HOSPITAL_COMMUNITY)
Admission: RE | Admit: 2012-05-28 | Discharge: 2012-05-28 | Disposition: A | Discharge status: Home / Self Care | Payer: Medicare Other | Source: Ambulatory Visit | Attending: Internal Medicine | Admitting: Internal Medicine

## 2012-05-28 DIAGNOSIS — Z1239 Encounter for other screening for malignant neoplasm of breast: Secondary | ICD-10-CM

## 2012-05-28 DIAGNOSIS — Z1231 Encounter for screening mammogram for malignant neoplasm of breast: Secondary | ICD-10-CM | POA: Insufficient documentation

## 2012-05-30 ENCOUNTER — Encounter (HOSPITAL_COMMUNITY): Payer: Self-pay | Admitting: *Deleted

## 2012-05-30 ENCOUNTER — Emergency Department (HOSPITAL_COMMUNITY)
Admission: EM | Admit: 2012-05-30 | Discharge: 2012-05-30 | Disposition: A | Discharge status: Home / Self Care | Payer: Medicare Other | Attending: Emergency Medicine | Admitting: Emergency Medicine

## 2012-05-30 DIAGNOSIS — G4700 Insomnia, unspecified: Secondary | ICD-10-CM | POA: Insufficient documentation

## 2012-05-30 DIAGNOSIS — N390 Urinary tract infection, site not specified: Secondary | ICD-10-CM | POA: Insufficient documentation

## 2012-05-30 DIAGNOSIS — Z8719 Personal history of other diseases of the digestive system: Secondary | ICD-10-CM | POA: Insufficient documentation

## 2012-05-30 DIAGNOSIS — Z8679 Personal history of other diseases of the circulatory system: Secondary | ICD-10-CM | POA: Insufficient documentation

## 2012-05-30 DIAGNOSIS — Z7982 Long term (current) use of aspirin: Secondary | ICD-10-CM | POA: Insufficient documentation

## 2012-05-30 DIAGNOSIS — F3289 Other specified depressive episodes: Secondary | ICD-10-CM | POA: Insufficient documentation

## 2012-05-30 DIAGNOSIS — Z79899 Other long term (current) drug therapy: Secondary | ICD-10-CM | POA: Insufficient documentation

## 2012-05-30 DIAGNOSIS — E871 Hypo-osmolality and hyponatremia: Secondary | ICD-10-CM | POA: Insufficient documentation

## 2012-05-30 DIAGNOSIS — R109 Unspecified abdominal pain: Secondary | ICD-10-CM

## 2012-05-30 DIAGNOSIS — F329 Major depressive disorder, single episode, unspecified: Secondary | ICD-10-CM | POA: Insufficient documentation

## 2012-05-30 DIAGNOSIS — F17200 Nicotine dependence, unspecified, uncomplicated: Secondary | ICD-10-CM | POA: Insufficient documentation

## 2012-05-30 LAB — CBC WITH DIFFERENTIAL/PLATELET
Basophils Absolute: 0 10*3/uL (ref 0.0–0.1)
Basophils Relative: 0 % (ref 0–1)
Eosinophils Absolute: 0 10*3/uL (ref 0.0–0.7)
Eosinophils Relative: 0 % (ref 0–5)
HCT: 41.5 % (ref 36.0–46.0)
Hemoglobin: 14.7 g/dL (ref 12.0–15.0)
Lymphocytes Relative: 13 % (ref 12–46)
Lymphs Abs: 1.3 10*3/uL (ref 0.7–4.0)
MCH: 30.9 pg (ref 26.0–34.0)
MCHC: 35.4 g/dL (ref 30.0–36.0)
MCV: 87.2 fL (ref 78.0–100.0)
Monocytes Absolute: 0.7 10*3/uL (ref 0.1–1.0)
Monocytes Relative: 7 % (ref 3–12)
Neutro Abs: 8.4 10*3/uL — ABNORMAL HIGH (ref 1.7–7.7)
Neutrophils Relative %: 80 % — ABNORMAL HIGH (ref 43–77)
Platelets: 430 10*3/uL — ABNORMAL HIGH (ref 150–400)
RBC: 4.76 MIL/uL (ref 3.87–5.11)
RDW: 12.6 % (ref 11.5–15.5)
WBC: 10.5 10*3/uL (ref 4.0–10.5)

## 2012-05-30 LAB — URINE MICROSCOPIC-ADD ON

## 2012-05-30 LAB — URINALYSIS, ROUTINE W REFLEX MICROSCOPIC
Bilirubin Urine: NEGATIVE
Glucose, UA: NEGATIVE mg/dL
Ketones, ur: NEGATIVE mg/dL
Nitrite: NEGATIVE
Protein, ur: 30 mg/dL — AB
Specific Gravity, Urine: 1.022 (ref 1.005–1.030)
Urobilinogen, UA: 0.2 mg/dL (ref 0.0–1.0)
pH: 5.5 (ref 5.0–8.0)

## 2012-05-30 LAB — LIPASE, BLOOD: Lipase: 51 U/L (ref 11–59)

## 2012-05-30 LAB — COMPREHENSIVE METABOLIC PANEL
ALT: 21 U/L (ref 0–35)
AST: 22 U/L (ref 0–37)
Albumin: 3.6 g/dL (ref 3.5–5.2)
Alkaline Phosphatase: 159 U/L — ABNORMAL HIGH (ref 39–117)
BUN: 17 mg/dL (ref 6–23)
CO2: 16 mEq/L — ABNORMAL LOW (ref 19–32)
Calcium: 9.8 mg/dL (ref 8.4–10.5)
Chloride: 97 mEq/L (ref 96–112)
Creatinine, Ser: 0.72 mg/dL (ref 0.50–1.10)
GFR calc Af Amer: >90 mL/min (ref >90)
GFR calc non Af Amer: >90 mL/min (ref >90)
Glucose, Bld: 117 mg/dL — ABNORMAL HIGH (ref 70–99)
Potassium: 4.4 mEq/L (ref 3.5–5.1)
Sodium: 130 mEq/L — ABNORMAL LOW (ref 135–145)
Total Bilirubin: 0.3 mg/dL (ref 0.3–1.2)
Total Protein: 8.4 g/dL — ABNORMAL HIGH (ref 6.0–8.3)

## 2012-05-30 MED ORDER — HYDROMORPHONE HCL PF 1 MG/ML IJ SOLN
1.0000 mg | Freq: Once | INTRAMUSCULAR | Status: AC
  Administered 2012-05-30: 1 mg via INTRAVENOUS
  Filled 2012-05-30: qty 1

## 2012-05-30 MED ORDER — ONDANSETRON HCL 4 MG PO TABS: 4.0000 mg | ORAL_TABLET | Freq: Four times a day (QID) | ORAL | Status: DC

## 2012-05-30 MED ORDER — HYDROMORPHONE HCL PF 1 MG/ML IJ SOLN
0.5000 mg | Freq: Once | INTRAMUSCULAR | Status: AC
  Administered 2012-05-30: 0.5 mg via INTRAVENOUS
  Filled 2012-05-30: qty 1

## 2012-05-30 MED ORDER — ONDANSETRON HCL 4 MG/2ML IJ SOLN
4.0000 mg | Freq: Once | INTRAMUSCULAR | Status: AC
  Administered 2012-05-30: 4 mg via INTRAVENOUS
  Filled 2012-05-30: qty 2

## 2012-05-30 MED ORDER — OXYCODONE-ACETAMINOPHEN 5-325 MG PO TABS: 1.0000 | ORAL_TABLET | ORAL | Status: DC | PRN

## 2012-05-30 MED ORDER — SODIUM CHLORIDE 0.9 % IV BOLUS (SEPSIS)
1000.0000 mL | Freq: Once | INTRAVENOUS | Status: AC
  Administered 2012-05-30: 1000 mL via INTRAVENOUS

## 2012-05-30 MED ORDER — DEXTROSE 5 % IV SOLN
1.0000 g | Freq: Once | INTRAVENOUS | Status: AC
  Administered 2012-05-30: 1 g via INTRAVENOUS
  Filled 2012-05-30: qty 10

## 2012-05-30 MED ORDER — CEPHALEXIN 500 MG PO CAPS: 500.0000 mg | ORAL_CAPSULE | Freq: Two times a day (BID) | ORAL | Status: DC

## 2012-05-30 NOTE — ED Notes (Signed)
Pt c/o LUQ ache x 3 days ago. Pt denies taking any meds, unable to tolerate food x 3 days. A&Ox4, ambulatory, stable.

## 2012-05-30 NOTE — ED Provider Notes (Signed)
History    [age]-year-old female with abdominal pain adn emesis. Onset 2 days ago. Abdominal pain is in epigastrium and left upper quadrant. Patient cannot remember if the vomiting or the pain began first. Multiple episodes of nonbilious/nonbloody emesis. This actually resolved yesterday with no vomiting today. Pain has continued though. Does not radiate. No appreciable exacerbating relieving factors. Patient has a history of chronic abdominal pain and is not uncommon for her to have nausea/vomiting or diarrhea. History of Crohn's disease status post colostomy. Ostomy continues to put out. No urinary complaints. No fevers or chills.  CSN: [id number]  Arrival date & time 05/30/12  1616   First MD Initiated Contact with Patient 05/30/12 1811      Chief Complaint  Patient presents with  . Abdominal Pain    (Consider location/radiation/quality/duration/timing/severity/associated sxs/prior treatment) HPI  Past Medical History  Diagnosis Date  . Crohn's disease 1980's     diagnosis made in 1980s, status post multiple surgeries including ileostomy and total colectomy; November 29 2009 single contrast ileostomy enema performed and normal ileostomy study following total colectomy was noted with no evidence of Crohn's dz;  Marland Kitchen Depression   . Substance abuse     tobacco  . Insomnia   . 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 Colectomy 1980's     status post colectomy with right lower quadrant ileostomy, this was performed in 1980s and we have no details of the procedures,  however this was confirmed by multiple CTs of the abdomen and pelvis, last CT of the abdomen and pelvis June 18, 2010 showed no evidence of active Crohn's disease or complications  . Laparoscopy 04/13/2010     diagnostic laparoscopy, lysis of adhesions and small bowel resection with ileostomy revision, done by Dr. Dwain Sarna;  postoperative diagnoses =   difficulty with ileostomy and ileostomy stricture,  pathology report April 13, 2010 -->  ileostomy with focal ulceration, granulation tissue and fibrosis  . Colon stricture dilatation   . Bowel resection   . Tubal ligation   . Oophorectomy     Family History  Problem Relation Age of Onset  . Crohn's disease Brother   . Cancer Father     lung  . Heart disease Father     heart attack    History  Substance Use Topics  . Smoking status: Current Every Day Smoker -- 1.0 packs/day for 30 years    Types: Cigarettes  . Smokeless tobacco: Never Used     Comment: less than a pack a day  . Alcohol Use: No    OB History    Grav Para Term Preterm Abortions TAB SAB Ect Mult Living                  Review of Systems  All systems reviewed and negative, other than as noted in HPI.   Allergies  Codeine and Tramadol  Home Medications   Current Outpatient Rx  Name  Route  Sig  Dispense  Refill  . ALENDRONATE SODIUM 70 MG PO TABS   Oral   Take 70 mg by mouth every 7 (seven) days. On Sunday         . ASPIRIN 325 MG PO TABS   Oral   Take 325 mg by mouth daily.          . FENTANYL 50 MCG/HR TD PT72   Transdermal   Place 1 patch onto the  skin every 3 (three) days.         Marland Kitchen GABAPENTIN 100 MG PO CAPS   Oral   Take 200-300 mg by mouth 2 (two) times daily. 3 caps in the am, 2 caps in the evening         . HYDROCODONE-ACETAMINOPHEN 10-325 MG PO TABS   Oral   Take 1 tablet by mouth every 8 (eight) hours as needed. For pain         . METOPROLOL SUCCINATE ER 25 MG PO TB24   Oral   Take 12.5 mg by mouth 3 (three) times daily.         . NYSTATIN 100000 UNIT/GM EX POWD   Topical   Apply 1 g topically 3 (three) times daily as needed. To rash on stomach         . ZOLPIDEM TARTRATE 10 MG PO TABS   Oral   Take 10 mg by mouth at bedtime as needed. For insomnia           BP 107/55  Pulse 70  Temp 98.1 F (36.7 C) (Oral)  Resp 18  SpO2 97%  LMP  01/26/1990  Physical Exam  Nursing note and vitals reviewed. Constitutional: She appears well-developed and well-nourished. No distress.       Laying in bed. Thin and frail appearing.  HENT:  Head: Normocephalic and atraumatic.  Eyes: Conjunctivae normal are normal. Right eye exhibits no discharge. Left eye exhibits no discharge.  Neck: Neck supple.  Cardiovascular: Normal rate, regular rhythm and normal heart sounds.  Exam reveals no gallop and no friction rub.   No murmur heard. Pulmonary/Chest: Effort normal and breath sounds normal. No respiratory distress.  Abdominal: Soft. She exhibits no distension. There is tenderness.       Epigastric tenderness without rebound or guarding. No distention. Ostomy with a soft brown stool.  Musculoskeletal: She exhibits no edema and no tenderness.  Neurological: She is alert.  Skin: Skin is warm and dry.  Psychiatric: She has a normal mood and affect. Her behavior is normal. Thought content normal.    ED Course  Procedures (including critical care time)  Labs Reviewed  CBC WITH DIFFERENTIAL - Abnormal; Notable for the following:    Platelets 430 (*)     Neutrophils Relative 80 (*)     Neutro Abs 8.4 (*)     All other components within normal limits  COMPREHENSIVE METABOLIC PANEL - Abnormal; Notable for the following:    Sodium 130 (*)     CO2 16 (*)     Glucose, Bld 117 (*)     Total Protein 8.4 (*)     Alkaline Phosphatase 159 (*)     All other components within normal limits  LIPASE, BLOOD  URINALYSIS, ROUTINE W REFLEX MICROSCOPIC   No results found.   1. Abdominal pain   2. Hyponatremia       MDM  [age]-year-old female with abdominal pain and vomiting. Consider obstruction, particularly with her past surgical history but doubt.Vomiting has actually resolved today. Ostomy continues to put out. Patient is still complaining of some epigastric pain. She has some tenderness on exam, but she's not peritoneal. Possibly  musculoskeletal strain from ongoing emesis. Her lipase is normal. Her labs are significant for a bicarbonate of 16 and sodium 130. This could be explained from volume depletion/GI losses. Renal function is normal. She received IV fluids in the emergency room. No emesis. Pain is improving. We'll continue to reassess.  Patient reports marked improvement of her pain. She would like to go home. Urinalysis still pending. Result should not change disposition but possibly influence decision to give antibiotics.        Raeford Razor, MD 05/30/12 [phone]

## 2012-05-30 NOTE — ED Notes (Signed)
Pt has specimen cup at bedside. Unable to urinate at this time.

## 2012-05-30 NOTE — ED Notes (Signed)
Pt with hx of chron's to ED c/o LUQ pain and minimal emesis, but none today.  Pt has ileostomy from previous bowel surgeries r.t  Chron's.

## 2012-05-31 LAB — URINE CULTURE
Colony Count: NO GROWTH
Culture: NO GROWTH

## 2012-06-12 ENCOUNTER — Encounter: Payer: Self-pay | Admitting: Obstetrics & Gynecology

## 2012-06-26 ENCOUNTER — Encounter: Payer: Self-pay | Admitting: Obstetrics and Gynecology

## 2012-07-15 ENCOUNTER — Encounter: Payer: Self-pay | Admitting: Obstetrics & Gynecology

## 2012-07-29 NOTE — Addendum Note (Signed)
Addended by: Neomia Dear on: 07/29/2012 06:35 PM   Modules accepted: Orders

## 2012-09-27 ENCOUNTER — Other Ambulatory Visit: Payer: Self-pay | Admitting: Internal Medicine

## 2013-03-10 ENCOUNTER — Other Ambulatory Visit: Payer: Self-pay | Admitting: Internal Medicine

## 2013-03-11 NOTE — Telephone Encounter (Signed)
Ambien rx called to [facility]. 

## 2013-07-10 ENCOUNTER — Other Ambulatory Visit: Payer: Self-pay | Admitting: Internal Medicine

## 2013-07-23 ENCOUNTER — Encounter: Payer: Self-pay | Admitting: Internal Medicine

## 2013-07-23 ENCOUNTER — Ambulatory Visit (INDEPENDENT_AMBULATORY_CARE_PROVIDER_SITE_OTHER): Payer: Medicare Other | Admitting: Internal Medicine

## 2013-07-23 VITALS — BP 99/58 | HR 62 | Temp 97.2°F | Ht 65.0 in | Wt 112.2 lb

## 2013-07-23 DIAGNOSIS — B372 Candidiasis of skin and nail: Secondary | ICD-10-CM

## 2013-07-23 MED ORDER — ZOLPIDEM TARTRATE 10 MG PO TABS: 10.0000 mg | ORAL_TABLET | Freq: Every evening | ORAL | Status: DC | PRN

## 2013-07-23 MED ORDER — LIDOCAINE 3.75 % EX CREA: 5.0000 g | TOPICAL_CREAM | Freq: Three times a day (TID) | CUTANEOUS | Status: DC | PRN

## 2013-07-23 MED ORDER — LIDOCAINE 4 % EX CREA: 1.0000 "application " | TOPICAL_CREAM | CUTANEOUS | Status: DC | PRN

## 2013-07-23 MED ORDER — FLUCONAZOLE 100 MG PO TABS: 100.0000 mg | ORAL_TABLET | Freq: Every day | ORAL | Status: DC

## 2013-07-23 NOTE — Patient Instructions (Signed)
We will give you a medicine for the rash to calm it down called diflucan. Take 1 pill a day for 7 days.   We have given you a cream for pain that you can use up to 3 times per day.  We have refilled your ambien.  Come back as scheduled or the area gets worse. I would recommend going back to the surgeon to have them look at the area as well.  Call us with questions or problems at [phone].

## 2013-07-23 NOTE — Assessment & Plan Note (Signed)
Some mild yeast around the ostomy and also yeast under the breasts and in perineum. Advised her to use diflucan daily for 1 week and if no improvement to call back. Gave rx for lidocaine cream to use on ostomy area to keep it from hurting. Advised that we could not change her pain regimen as she is at pain clinic. She agreed and wanted refill on ambien which was given for #30 no refills.

## 2013-07-23 NOTE — Progress Notes (Signed)
Case discussed with Dr. Kollar at the time of the visit.  We reviewed the resident's history and exam and pertinent patient test results.  I agree with the assessment, diagnosis, and plan of care documented in the resident's note.     

## 2013-07-23 NOTE — Progress Notes (Signed)
Subjective:     Patient ID: Misty [name], female   DOB: Sep 29, 1950, 63 y.o.   MRN: [id number]  HPI The patient is a 63 YO female with PMH of colostomy for crohn's disease, tobacco abuse who is coming in for skin infection. She has noticed over the last several months she will get these pink spots around her perineal area and under her breasts. The skin around her ostomy site has also been irritated off and on. Usually it gets better on its own but this time it has just gotten bad. She is also having a lot of pain at the site. She is picking at the site during our exam. She has not had fevers or chills or weight loss. She has not had any changes with the discharge from her ostomy. She has not noticed a yeast infection vaginally. She denies any site where the skin is open and draining. She only changes her ostomy appliance once per week. She is losing sleep because of the pain around the ostomy site.  Review of Systems  Constitutional: Negative for fever, chills, diaphoresis, activity change, appetite change, fatigue and unexpected weight change.  Gastrointestinal: Positive for abdominal pain. Negative for nausea, vomiting, diarrhea and constipation.  Skin: Positive for rash. Negative for pallor and wound.       Objective:   Physical Exam  Constitutional: She is oriented to person, place, and time. She appears well-developed and well-nourished.  Thin  Cardiovascular: Normal rate and regular rhythm.   Pulmonary/Chest: Effort normal and breath sounds normal. No respiratory distress. She has no wheezes. She has no rales.  Smells of tobacco  Abdominal: Soft. Bowel sounds are normal.  Ostomy intact with brown drainage. She has the appearance around it of chronic irritation. No discrete bacterial infection or pus noted. No pockets of abscess or drainage. She is picking at lose pieces of skin during our exam and has the sequelae of picking chronically. No warmth to the skin.   Neurological: She is  alert and oriented to person, place, and time. No cranial nerve deficit.  Skin: Skin is warm and dry.  Areas of red, yeast appearing under the breast and around the perineum that have satellite lesions. Skin is dry and intact. Without stigmata of scratching under breasts and around perineum.        Assessment/Plan:   1. Please see problem oriented charting.  2. Disposition - She will return if worsening or no improvement with diflucan. Advised her against picking at the skin and that this may take some time to heal given the chronicity appearing skin. Also advised her to return to the surgeon that did the ostomy. She may need wound care in the future to help this area to heal.

## 2013-08-31 ENCOUNTER — Other Ambulatory Visit: Payer: Self-pay | Admitting: Internal Medicine

## 2013-09-01 NOTE — Telephone Encounter (Signed)
Called to pharm 

## 2013-11-04 ENCOUNTER — Other Ambulatory Visit: Payer: Self-pay | Admitting: *Deleted

## 2013-11-04 MED ORDER — ZOLPIDEM TARTRATE 5 MG PO TABS: 5.0000 mg | ORAL_TABLET | Freq: Every evening | ORAL | Status: DC | PRN

## 2013-11-04 NOTE — Telephone Encounter (Signed)
Rx called in to pharmacy and flag sent to front desk pool for appt as directed.

## 2013-11-04 NOTE — Telephone Encounter (Signed)
Insomnia is not on her problem list. But seems to be getting it freq. Due to gender and age, recs are to Rx only 5 mg which I will do. Pt needs sch F/U appt with new PCP.

## 2013-11-26 ENCOUNTER — Ambulatory Visit (INDEPENDENT_AMBULATORY_CARE_PROVIDER_SITE_OTHER): Payer: Medicare Other | Admitting: Internal Medicine

## 2013-11-26 ENCOUNTER — Telehealth: Payer: Self-pay | Admitting: Internal Medicine

## 2013-11-26 ENCOUNTER — Ambulatory Visit (HOSPITAL_COMMUNITY)
Admission: RE | Admit: 2013-11-26 | Discharge: 2013-11-26 | Disposition: A | Discharge status: Home / Self Care | Payer: Medicare Other | Source: Ambulatory Visit | Attending: Family Medicine | Admitting: Family Medicine

## 2013-11-26 ENCOUNTER — Encounter: Payer: Self-pay | Admitting: Internal Medicine

## 2013-11-26 VITALS — BP 102/57 | HR 73 | Temp 97.8°F | Ht 64.0 in | Wt 119.9 lb

## 2013-11-26 DIAGNOSIS — R9431 Abnormal electrocardiogram [ECG] [EKG]: Secondary | ICD-10-CM

## 2013-11-26 DIAGNOSIS — I5032 Chronic diastolic (congestive) heart failure: Secondary | ICD-10-CM | POA: Insufficient documentation

## 2013-11-26 DIAGNOSIS — G4701 Insomnia due to medical condition: Secondary | ICD-10-CM

## 2013-11-26 DIAGNOSIS — I4510 Unspecified right bundle-branch block: Secondary | ICD-10-CM | POA: Diagnosis not present

## 2013-11-26 DIAGNOSIS — I498 Other specified cardiac arrhythmias: Secondary | ICD-10-CM | POA: Diagnosis not present

## 2013-11-26 DIAGNOSIS — I4729 Other ventricular tachycardia: Secondary | ICD-10-CM

## 2013-11-26 DIAGNOSIS — I472 Ventricular tachycardia, unspecified: Secondary | ICD-10-CM

## 2013-11-26 DIAGNOSIS — I509 Heart failure, unspecified: Secondary | ICD-10-CM | POA: Insufficient documentation

## 2013-11-26 DIAGNOSIS — M7989 Other specified soft tissue disorders: Secondary | ICD-10-CM

## 2013-11-26 DIAGNOSIS — G8929 Other chronic pain: Secondary | ICD-10-CM

## 2013-11-26 DIAGNOSIS — R9389 Abnormal findings on diagnostic imaging of other specified body structures: Secondary | ICD-10-CM | POA: Diagnosis not present

## 2013-11-26 DIAGNOSIS — F17200 Nicotine dependence, unspecified, uncomplicated: Secondary | ICD-10-CM

## 2013-11-26 DIAGNOSIS — I4581 Long QT syndrome: Secondary | ICD-10-CM

## 2013-11-26 DIAGNOSIS — R109 Unspecified abdominal pain: Secondary | ICD-10-CM

## 2013-11-26 DIAGNOSIS — M810 Age-related osteoporosis without current pathological fracture: Secondary | ICD-10-CM

## 2013-11-26 DIAGNOSIS — Z0000 Encounter for general adult medical examination without abnormal findings: Secondary | ICD-10-CM

## 2013-11-26 DIAGNOSIS — K5090 Crohn's disease, unspecified, without complications: Secondary | ICD-10-CM

## 2013-11-26 HISTORY — DX: Insomnia due to medical condition: G47.01

## 2013-11-26 LAB — COMPLETE METABOLIC PANEL WITH GFR
ALT: 22 U/L (ref 0–35)
AST: 36 U/L (ref 0–37)
Albumin: 3.4 g/dL — ABNORMAL LOW (ref 3.5–5.2)
Alkaline Phosphatase: 108 U/L (ref 39–117)
BILIRUBIN TOTAL: 0.4 mg/dL (ref 0.2–1.2)
BUN: 6 mg/dL (ref 6–23)
CO2: 29 meq/L (ref 19–32)
CREATININE: 0.89 mg/dL (ref 0.50–1.10)
Calcium: 8.9 mg/dL (ref 8.4–10.5)
Chloride: 101 mEq/L (ref 96–112)
GFR, EST AFRICAN AMERICAN: 80 mL/min
GFR, EST NON AFRICAN AMERICAN: 70 mL/min
GLUCOSE: 73 mg/dL (ref 70–99)
Potassium: 3.3 mEq/L — ABNORMAL LOW (ref 3.5–5.3)
Sodium: 142 mEq/L (ref 135–145)
Total Protein: 7.1 g/dL (ref 6.0–8.3)

## 2013-11-26 LAB — PRO B NATRIURETIC PEPTIDE: Pro B Natriuretic peptide (BNP): 146.7 pg/mL — ABNORMAL HIGH (ref <126)

## 2013-11-26 MED ORDER — FUROSEMIDE 20 MG PO TABS: 20.0000 mg | ORAL_TABLET | Freq: Every day | ORAL | Status: DC

## 2013-11-26 MED ORDER — ALENDRONATE SODIUM 10 MG PO TABS: 10.0000 mg | ORAL_TABLET | Freq: Every day | ORAL | Status: DC

## 2013-11-26 MED ORDER — ZOLPIDEM TARTRATE 10 MG PO TABS: 10.0000 mg | ORAL_TABLET | Freq: Every evening | ORAL | Status: DC | PRN

## 2013-11-26 NOTE — Telephone Encounter (Signed)
   Reason for call:   I received a call from Ms. Rinaldo Cloud at [phone] indicating that Dr. Valentino Saxon saw Ms. Holtzclaw in [place] today and prescribed [place], however his DEA number is invalid.   Pertinent Data:   Dr. Leatha Gilding received the call initially, tried her DEA number also read as invalid as was mine. I then spoke with Aggie Cosier directly and we noticed that there was no - being used for the DEA number, instead empty space. So perhaps this is a computer entry issue.    Assessment / Plan / Recommendations:   Aggie Cosier from pharmacy will [facility] and try the full DEA number to see if this helps. Otherwise, the prescription will be held and I will forward this note to Dr. Valentino Saxon who can address the issue in the AM.   I will also forward this note to Dr. Valentino Saxon, Dr. Rogelia Boga, and Tyler Aas (incase there is an issue with our DEA numbers).   Darden Palmer, MD   11/26/2013, 7:02 PM

## 2013-11-26 NOTE — Assessment & Plan Note (Signed)
Misty [name] has difficulty sleeping due to her chronic pain. She cannot be on medications that prolong QTc. She was taking ambien 10 mg qhs which was switched to 5 mg qhs even though she now takes 2 pills (10 mg total still). Per discussion with Dr. Criselda Peaches, although patient is >63 years old and female, she has safely used ambien 10 mg qhs for three years and can continue so long as she is asymptomatic. -ambien 10 mg qhs

## 2013-11-26 NOTE — Progress Notes (Signed)
   Subjective:    Patient ID: Misty [name], female    DOB: November 03, 1950, 63 y.o.   MRN: [id number]  HPI  Misty [name] is a 63 year old woman with a history of Crohn's disease s/p bowel resection and ostomy creation 2011, prolonged QT syndrome, and chronic diastolic heart failure presenting with several months of bilateral lower extremity swelling. She notes that this has been an issue off and on for years but for the past several months the swelling has gotten worse and is continuous. It is worse at the end of the work day when she has been on her feet all day. She also notes isolated episodes of heart palpitations. She denies chest pain, SOB, orthopnea, abdominal pain, dysuria, polyuria, fevers, chills, night sweats, changes in her stool, or constipation. She has a 40 pack-year history of smoking 1 ppd and denies any alcohol or illicit drug use.  Review of Systems  Constitutional: Negative for fever, chills, diaphoresis and unexpected weight change.  Respiratory: Negative for cough, chest tightness and shortness of breath.   Cardiovascular: Positive for palpitations and leg swelling. Negative for chest pain.  Gastrointestinal: Negative for nausea, vomiting, abdominal pain, constipation and blood in stool.  Endocrine: Negative for polyuria.  Genitourinary: Negative for difficulty urinating.  Neurological: Negative for dizziness, light-headedness, numbness and headaches.       Objective:   Physical Exam  Constitutional: She is oriented to person, place, and time. She appears well-developed.  HENT:  Head: Normocephalic and atraumatic.  Mouth/Throat: Oropharynx is clear and moist.  Eyes: Pupils are equal, round, and reactive to light.  Cardiovascular: Normal rate, regular rhythm, normal heart sounds and intact distal pulses.  Exam reveals no gallop and no friction rub.   No murmur heard. Pulmonary/Chest: No respiratory distress. She has no wheezes. She has no rales.  Abdominal: She exhibits  no distension. There is tenderness. There is no rebound and no guarding.  R sided ostomy with surrounding tenderness, stool appears normal  Musculoskeletal: She exhibits edema.  1+ b/l pitting edema through feet up to knees, no erythema, normal warmth  Neurological: She is alert and oriented to person, place, and time. No cranial nerve deficit.          Assessment & Plan:

## 2013-11-26 NOTE — Assessment & Plan Note (Signed)
Patient notes difficulty remembering to take her alendronate weekly. -change alendronate 70 mg weekly to 10 mg daily -consider DEXA scan at next appointment

## 2013-11-26 NOTE — Patient Instructions (Addendum)
It was a pleasure to meet you. Please schedule a follow up visit within the next 6 weeks on August 26 at 315pm.  For your swelling, we started you on lasix (water pill) 20 mg by mouth daily. We changed your alendronate (fosamax) from weekly to daily. We put you back on the 10 mg of ambien.  For your medications:   Please bring all of your pill  Bottles with you to each visit.  This will help make sure that we have an up to date list of all the medications you are taking.  Please also bring any over the counter herbal medications you are taking (not including advil, tylenol, etc.)   Thank you! Alendronate tablets What is this medicine? ALENDRONATE (a LEN droe nate) slows calcium loss from bones. It helps to make normal healthy bone and to slow bone loss in people with Paget's disease and osteoporosis. It may be used in others at risk for bone loss. This medicine may be used for other purposes; ask your health care provider or pharmacist if you have questions. COMMON BRAND NAME(S): Fosamax Take this medicine by mouth first thing in the morning, after you are up for the day. Do not eat or drink anything before you take your medicine. Swallow the tablet with a full glass (6 to 8 fluid ounces) of plain water. Do not take this medicine with any other drink. Do not chew or crush the tablet. After taking this medicine, do not eat breakfast, drink, or take any medicines or vitamins for at least 30 minutes. Sit or stand up for at least 30 minutes after you take this medicine; do not lie down. Do not take your medicine more often than directed.

## 2013-11-26 NOTE — Assessment & Plan Note (Signed)
This is likely secondary to diastolic heart failure (see chronic dCHF A&P) -ekg -echo -proBNP -CMP -lasix 20 mg po daily -cont metoprolol, ASA

## 2013-11-26 NOTE — Assessment & Plan Note (Signed)
Patient followed by GI. She reported unchanged abdominal pain that is at her baseline. She attends a pain clinic to manage her pain meds -Followed by GI, pain clinic -continue fentanyl patch, hydrocodone-acetaminophen, gabapentin per pain clinic

## 2013-11-26 NOTE — Assessment & Plan Note (Addendum)
Patient last echo on 04/2010 demonstrated EF 50-55% with grade 2 diastolic dysfunction.Pro BNP was 986 at that time. Patient current symptoms of b/l LE edema suggestive of chronic congestive heart failure; however, ROS is negative (orthopnea, SOB, etc). EKG at this time is unchanged with incomplete RBBB and TWI in leads V1, V2, V3.  -pro-BNP -echo -ekg (see above) -CMP -lasix 20 mg po daily -cont metoprolol, aspirin

## 2013-11-26 NOTE — Progress Notes (Signed)
[facility]ien rx [facility] to [facility].

## 2013-11-26 NOTE — Assessment & Plan Note (Signed)
Patient has been smoking for 40 years. She says she recently cut down from 2 ppd to 1 ppd. She was offered smoking cessation counseling and declined -cont to offer smoking cessation opportunities

## 2013-11-26 NOTE — Assessment & Plan Note (Signed)
Patient has a history of prolonged QT. She had an EKG today with a QTc of 449 (previous 437 on 12/2010).  -Avoid QT prolonging drugs

## 2013-11-26 NOTE — Assessment & Plan Note (Signed)
Misty [name] has a history of polymorphic v tach in the context of QT prolongation. She should avoid QT prolonging agents. She had an EKG today which was in normal sinus rhythm -avoid QT prolonging agents

## 2013-11-26 NOTE — Assessment & Plan Note (Signed)
This appointment addressed her LE edema and she is due for DEXAscan and mammogram. She will RTC in 4-6 weeks and we will discuss maintenance/preventative care at that time.

## 2013-11-27 ENCOUNTER — Telehealth: Payer: Self-pay | Admitting: *Deleted

## 2013-11-27 NOTE — Telephone Encounter (Signed)
The problem is on the [facility] side. I called the [facility] store and got it corrected. They need to use an override written by their IT people. [facility] but I have a ticket out to double check our side anyway. The OM is out and I don't have the [facility] number but will try to find it and contact him as well. The message should be passed along as we have a lot of Rx's filled at this store. I will try to anticipate the impact and get the word out to [facility] for this region that it is happening again.

## 2013-11-28 NOTE — Progress Notes (Signed)
I saw and evaluated the patient.  I personally confirmed the key portions of the history and exam documented by Dr. Rothman and I reviewed pertinent patient test results.  The assessment, diagnosis, and plan were formulated together and I agree with the documentation in the resident's note. 

## 2013-12-09 ENCOUNTER — Ambulatory Visit (HOSPITAL_COMMUNITY): Payer: Medicare Other | Attending: Internal Medicine | Admitting: Radiology

## 2013-12-09 DIAGNOSIS — I5032 Chronic diastolic (congestive) heart failure: Secondary | ICD-10-CM

## 2013-12-09 DIAGNOSIS — I4729 Other ventricular tachycardia: Secondary | ICD-10-CM | POA: Diagnosis not present

## 2013-12-09 DIAGNOSIS — F17200 Nicotine dependence, unspecified, uncomplicated: Secondary | ICD-10-CM | POA: Diagnosis not present

## 2013-12-09 DIAGNOSIS — I472 Ventricular tachycardia, unspecified: Secondary | ICD-10-CM | POA: Insufficient documentation

## 2013-12-09 DIAGNOSIS — I509 Heart failure, unspecified: Secondary | ICD-10-CM | POA: Diagnosis present

## 2013-12-09 NOTE — Progress Notes (Signed)
Echocardiogram performed.  

## 2013-12-25 ENCOUNTER — Encounter (HOSPITAL_COMMUNITY): Payer: Self-pay

## 2014-01-07 ENCOUNTER — Encounter: Payer: Self-pay | Admitting: Internal Medicine

## 2014-01-07 ENCOUNTER — Ambulatory Visit (INDEPENDENT_AMBULATORY_CARE_PROVIDER_SITE_OTHER): Payer: Medicare Other | Admitting: Internal Medicine

## 2014-01-07 VITALS — BP 106/61 | HR 62 | Temp 99.2°F | Ht 65.0 in | Wt 120.6 lb

## 2014-01-07 DIAGNOSIS — M810 Age-related osteoporosis without current pathological fracture: Secondary | ICD-10-CM

## 2014-01-07 DIAGNOSIS — E876 Hypokalemia: Secondary | ICD-10-CM

## 2014-01-07 DIAGNOSIS — K5090 Crohn's disease, unspecified, without complications: Secondary | ICD-10-CM

## 2014-01-07 DIAGNOSIS — Z0000 Encounter for general adult medical examination without abnormal findings: Secondary | ICD-10-CM

## 2014-01-07 DIAGNOSIS — M7989 Other specified soft tissue disorders: Secondary | ICD-10-CM

## 2014-01-07 DIAGNOSIS — I4581 Long QT syndrome: Secondary | ICD-10-CM

## 2014-01-07 DIAGNOSIS — F17200 Nicotine dependence, unspecified, uncomplicated: Secondary | ICD-10-CM

## 2014-01-07 DIAGNOSIS — I509 Heart failure, unspecified: Secondary | ICD-10-CM

## 2014-01-07 DIAGNOSIS — I5032 Chronic diastolic (congestive) heart failure: Secondary | ICD-10-CM

## 2014-01-07 DIAGNOSIS — Z23 Encounter for immunization: Secondary | ICD-10-CM

## 2014-01-07 LAB — BASIC METABOLIC PANEL WITH GFR
BUN: 10 mg/dL (ref 6–23)
CHLORIDE: 101 meq/L (ref 96–112)
CO2: 30 meq/L (ref 19–32)
CREATININE: 0.81 mg/dL (ref 0.50–1.10)
Calcium: 9.2 mg/dL (ref 8.4–10.5)
GFR, Est African American: >89 mL/min
GFR, Est Non African American: 78 mL/min
GLUCOSE: 93 mg/dL (ref 70–99)
Potassium: 4 mEq/L (ref 3.5–5.3)
Sodium: 138 mEq/L (ref 135–145)

## 2014-01-07 LAB — LIPID PANEL
CHOLESTEROL: 173 mg/dL (ref 0–200)
HDL: 79 mg/dL (ref >39)
LDL Cholesterol: 51 mg/dL (ref 0–99)
TRIGLYCERIDES: 215 mg/dL — AB (ref <150)
Total CHOL/HDL Ratio: 2.2 Ratio
VLDL: 43 mg/dL — ABNORMAL HIGH (ref 0–40)

## 2014-01-07 NOTE — Assessment & Plan Note (Signed)
Patient reports better compliance with alendronate 10 mg daily vs 70 mg weekly. Last DEXA scan 2011 so will repeat -repeat DEXA scan and reassess -continue alendronate 10 mg daily

## 2014-01-07 NOTE — Assessment & Plan Note (Signed)
Bilaterally feet swelling to ankles with 1+ edema is unchanged from prior despite addition of lasix 20 mg daily. In addition, proBNP and echo improved as discussed above. This may also be due to venous stasis/insufficiency. Patient was counseled to buy OTC compression stockings and see if they are improving -OTC compression stockings and call clinic to schedule f/u in 1 month if no improvement; otherwise 6 months -continue lasix 20 mg and reassess if compression stockings do not help -recheck BMP and supplement K as needed (3.3 prior)

## 2014-01-07 NOTE — Assessment & Plan Note (Signed)
Patient has a history of prolonged Qt. EKG last month w QTc 449 -avoid QT prolonging drugs

## 2014-01-07 NOTE — Progress Notes (Signed)
   Subjective:    Patient ID: Misty [name], female    DOB: August 09, 1950, 63 y.o.   MRN: [id number]  HPI  Misty [name] is a 63 year old woman with Crohn's disease s/p bowel resection with ostomy 2011, chronic diastolic HF, prolonged QTc, and osteoporosis here for follow-up on bilateral LE swelling. She was seen one month ago for b/l LE swelling and given lasix 20 mg po daily which she thinks does not help. Her proBNP was 146, EKG unchanged w RBBB and TWI V1, V2, V3, and echo showed EF 55-60%, otherwise normal which was improved from 2011 when she had EF 50-55% w grade 2 diastolic dysfunction. She denies any SOB or DOE, cough, or orthopnea. Her chronic abdominal pain is unchanged.  Review of Systems  Constitutional: Negative for fever, chills, diaphoresis, fatigue and unexpected weight change.  Respiratory: Negative for cough, chest tightness and shortness of breath.   Cardiovascular: Positive for leg swelling. Negative for chest pain and palpitations.  Gastrointestinal: Positive for abdominal pain. Negative for nausea and vomiting.  Musculoskeletal: Negative for arthralgias.  Neurological: Negative for dizziness, syncope, weakness, light-headedness and numbness.       Objective:   Physical Exam  Vitals reviewed. Constitutional: She is oriented to person, place, and time. She appears well-developed and well-nourished. No distress.  HENT:  Head: Normocephalic and atraumatic.  Mouth/Throat: Oropharynx is clear and moist.  Eyes: EOM are normal. Pupils are equal, round, and reactive to light.  Neck: No JVD present.  Cardiovascular: Normal rate, regular rhythm, normal heart sounds and intact distal pulses.  Exam reveals no gallop and no friction rub.   No murmur heard. Pulmonary/Chest: Effort normal and breath sounds normal. No respiratory distress. She has no wheezes.  Abdominal: Soft. Bowel sounds are normal. She exhibits no distension. There is tenderness.  R sided ostomy w nearby tenderness   Musculoskeletal: Normal range of motion.  1+ b/l pitting edema unchanged from prior. Feet 1+ DP/PT pulses, warm  Neurological: She is alert and oriented to person, place, and time.  Skin: She is not diaphoretic.          Assessment & Plan:

## 2014-01-07 NOTE — Assessment & Plan Note (Signed)
  Assessment: Progress toward smoking cessation:  smoking the same amount Barriers to progress toward smoking cessation:  lack of motivation to quit Comments: Patient acknowledges that tobacco is bad for health but is not motivated to quit  Plan: Instruction/counseling given:  I counseled patient on the dangers of tobacco use, advised patient to stop smoking, and reviewed strategies to maximize success. Educational resources provided:    Self management tools provided:    Medications to assist with smoking cessation:  None Patient agreed to the following self-care plans for smoking cessation: cut down the number of cigarettes smoked  Other plans: Patient declined medication but says she will try to cut down on own

## 2014-01-07 NOTE — Assessment & Plan Note (Addendum)
Patient echo 11/2013 with EF 55-60% and otherwise normal is improved from 2011 echo w EF 50-55% and grade 2 diastolic dysfunction. proBNP is also improved at 146. She was started on lasix 20 mg daily last visit for her LE swelling but does not think it has changed and does not think she is urinating any more. She is otherwise asymptomatic with clear lung exam and no JVD. CMP notable for K 3.3 -recheck K and supplement as needed -lipid panel as never performed -cont metoprolol, ASA, lasix

## 2014-01-07 NOTE — Assessment & Plan Note (Signed)
Patient does not need colonoscopy as she is s/p colectomy. She was given flu shot today. We are checking her lipid panel and will reassess. She is also scheduled for DEXA scan as discussed above.

## 2014-01-07 NOTE — Patient Instructions (Signed)
It was a pleasure to see you today. We discussed your feet swelling and that your heart failure is improved. Please buy compression stockings for your legs and elevate your legs when possible. Please call the clinic to schedule an appointment in one month if your symptoms persist. If they improve, I look forward to seeing you again in six months. Please go to your bone scan on 9/2 at 8:45am at the [facility]. I will call you if the lab results are abnormal from your blood draw today.

## 2014-01-07 NOTE — Assessment & Plan Note (Signed)
Patient reports unchanged abdominal pain from baseline. Attends pain clinic to manage these medications.  -Encouraged patient to go to GI if she has any flair symptoms -continue fentanyl patch, norco, and gabapentin per pain clinic

## 2014-01-13 NOTE — Progress Notes (Signed)
Internal Medicine Clinic Attending Date of visit: 01/07/2014  I saw and evaluated the patient.  I personally confirmed the key portions of the history and exam documented by Dr. Rothman and I reviewed pertinent patient test results.  The assessment, diagnosis, and plan were formulated together and I agree with the documentation in the resident's note. 

## 2014-01-14 ENCOUNTER — Ambulatory Visit (HOSPITAL_COMMUNITY)
Admission: RE | Admit: 2014-01-14 | Discharge: 2014-01-14 | Disposition: A | Discharge status: Home / Self Care | Payer: Medicare Other | Source: Ambulatory Visit | Attending: Internal Medicine | Admitting: Internal Medicine

## 2014-01-14 DIAGNOSIS — Z780 Asymptomatic menopausal state: Secondary | ICD-10-CM | POA: Insufficient documentation

## 2014-01-14 DIAGNOSIS — M810 Age-related osteoporosis without current pathological fracture: Secondary | ICD-10-CM

## 2014-01-14 DIAGNOSIS — Z13820 Encounter for screening for osteoporosis: Secondary | ICD-10-CM | POA: Insufficient documentation

## 2014-01-22 ENCOUNTER — Other Ambulatory Visit: Payer: Self-pay | Admitting: Internal Medicine

## 2014-01-30 ENCOUNTER — Other Ambulatory Visit: Payer: Self-pay | Admitting: *Deleted

## 2014-01-30 DIAGNOSIS — G4701 Insomnia due to medical condition: Secondary | ICD-10-CM

## 2014-01-30 MED ORDER — ZOLPIDEM TARTRATE 10 MG PO TABS: 10.0000 mg | ORAL_TABLET | Freq: Every evening | ORAL | Status: DC | PRN

## 2014-01-30 NOTE — Telephone Encounter (Signed)
Please put refills on the Rx, pt request.

## 2014-02-02 NOTE — Telephone Encounter (Signed)
Called to pharm 

## 2014-07-20 ENCOUNTER — Other Ambulatory Visit: Payer: Self-pay | Admitting: Internal Medicine

## 2014-07-20 NOTE — Telephone Encounter (Signed)
Called to pharm 

## 2014-07-21 ENCOUNTER — Other Ambulatory Visit: Payer: Self-pay | Admitting: *Deleted

## 2014-07-21 NOTE — Telephone Encounter (Signed)
Dr Selena Batten Would you review this pls? Appears on BB for prolonged QTc. In 2012 was on the short acting metoprolol but by 05/2011 on Toprol XL but still TID. Is there an indication for TID Toprol? I see no cards records with recs for TID Toprol. Thanks

## 2014-07-23 NOTE — Telephone Encounter (Signed)
Forward Dr Selena Batten

## 2014-07-24 NOTE — Telephone Encounter (Signed)
Hi Dr. Rogelia Boga, I contacted the pharmacy and they have been filling the 1/2 tablet TID. I would recommend dropping down to 1 tablet daily and monitor. This would support adherence as well easier on the patient. Let me know your thoughts.  Thanks.

## 2014-07-24 NOTE — Telephone Encounter (Signed)
Misty [name] is overdue for an appt. It would be best to explain metoprolol change in person to ensure understanding and answer questions / concerns. Pls sch her an appt ASAP - next week - with PCP or [facility] resident. If she needs refill prior to appt, let me know and I will.  Thanks

## 2014-07-27 NOTE — Telephone Encounter (Signed)
Pt scheduled for 3/16 with PCP

## 2014-07-29 ENCOUNTER — Encounter: Payer: Self-pay | Admitting: Internal Medicine

## 2014-07-29 ENCOUNTER — Ambulatory Visit (INDEPENDENT_AMBULATORY_CARE_PROVIDER_SITE_OTHER): Payer: Medicare Other | Admitting: Internal Medicine

## 2014-07-29 VITALS — BP 110/60 | HR 81 | Temp 98.3°F | Ht 65.0 in | Wt 103.6 lb

## 2014-07-29 DIAGNOSIS — I4581 Long QT syndrome: Secondary | ICD-10-CM

## 2014-07-29 DIAGNOSIS — Z1231 Encounter for screening mammogram for malignant neoplasm of breast: Secondary | ICD-10-CM | POA: Insufficient documentation

## 2014-07-29 MED ORDER — METOPROLOL SUCCINATE ER 25 MG PO TB24: 25.0000 mg | ORAL_TABLET | Freq: Every day | ORAL | Status: AC

## 2014-07-29 NOTE — Assessment & Plan Note (Signed)
Misty [name] is due for mammogram and it is ordered today. She agrees to go.

## 2014-07-29 NOTE — Patient Instructions (Signed)
It was a pleasure to see you today. Please get your mammogram. Please take your toprol-xl 25 mg once a day (not half a tab every 8 hours). Please return to clinic or seek medical attention if you have any new or worsening trouble breathing, swelling, or other worrisome medical condition. We look forward to seeing you again in 3 months.  Farley Ly, MD  General Instructions:   Please try to bring all your medicines next time. This will help [place] keep you safe from mistakes.   Progress Toward Treatment Goals:  Treatment Goal 01/07/2014  Stop smoking smoking the same amount    Self Care Goals & Plans:  Self Care Goal 07/29/2014  Manage my medications take my medicines as prescribed; refill my medications on time  Eat healthy foods eat more vegetables; eat foods that are low in salt; eat baked foods instead of fried foods  Be physically active -  Stop smoking (No Data)    No flowsheet data found.   Care Management & Community Referrals:  No flowsheet data found.

## 2014-07-29 NOTE — Assessment & Plan Note (Signed)
Misty [name] is prescribed toprol-xl 12.5 mg q8h for prolonged QTc with previous episode of polymorphic ventricular tachycardia. It is unclear why she is prescribed long acting metoprolol three times a day. A chart review shows she was previously on metoprolol tatrate 12.5 mg q8h by Dr Eden Emms of cardiology 06/2010 and then the next note switches this to metoprolol succinate 12.5 mg q8h with no explanation. She notes she actually is only taking it twice a day. I told her we will simplify it and she should take a whole tab toprol-xl 25 mg once a day and she agreed -toprol xl 25 mg daily

## 2014-07-29 NOTE — Progress Notes (Signed)
   Subjective:    Patient ID: Misty [name], female    DOB: 1951/02/13, 64 y.o.   MRN: [id number]  HPI  Ms Misty [name] is a 64 year old woman with Crohn's disease s/p bowel resection with ostomy 2011, chronic diastolic HF, osteoporosis, prolonged QTc here for routine follow-up. Please see problem-based charting assessment and plan note for further details of medical issues addressed at today's visit.   Review of Systems  Constitutional: Negative for fever, chills and diaphoresis.  Respiratory: Negative for shortness of breath.   Cardiovascular: Negative for chest pain, palpitations and leg swelling.  Gastrointestinal: Negative for nausea, vomiting, abdominal pain, diarrhea and constipation.  Neurological: Negative for weakness, light-headedness and numbness.       Objective:   Physical Exam  Constitutional: She is oriented to person, place, and time. She appears well-developed and well-nourished. No distress.  HENT:  Head: Normocephalic and atraumatic.  Mouth/Throat: Oropharynx is clear and moist.  Cardiovascular: Normal rate, regular rhythm and intact distal pulses.  Exam reveals no gallop and no friction rub.   No murmur heard. Pulmonary/Chest: Effort normal and breath sounds normal. No respiratory distress. She has no wheezes.  Abdominal: Soft. Bowel sounds are normal. She exhibits no distension.  Ostomy w minimal tenderness  Musculoskeletal: She exhibits no tenderness.  Neurological: She is alert and oriented to person, place, and time.  Skin: She is not diaphoretic.  Vitals reviewed.         Assessment & Plan:

## 2014-07-30 NOTE — Progress Notes (Signed)
Case discussed with Dr. Rothman at time of visit. We reviewed the resident's history and exam and pertinent patient test results. I agree with the assessment, diagnosis, and plan of care documented in the resident's note. 

## 2014-08-17 ENCOUNTER — Other Ambulatory Visit: Payer: Self-pay | Admitting: Internal Medicine

## 2014-08-18 NOTE — Telephone Encounter (Signed)
Called to pharm 

## 2014-09-10 ENCOUNTER — Encounter: Payer: Self-pay | Admitting: *Deleted

## 2014-09-21 ENCOUNTER — Ambulatory Visit (HOSPITAL_COMMUNITY): Payer: Medicare Other | Attending: Internal Medicine

## 2014-10-27 ENCOUNTER — Telehealth: Payer: Self-pay | Admitting: Internal Medicine

## 2014-10-27 NOTE — Telephone Encounter (Signed)
Call to patient to confirm appointment for 6/15 at 3:45 lmtcb

## 2014-10-28 ENCOUNTER — Encounter: Payer: Self-pay | Admitting: Internal Medicine

## 2014-11-20 ENCOUNTER — Other Ambulatory Visit: Payer: Self-pay | Admitting: *Deleted

## 2014-11-20 MED ORDER — ZOLPIDEM TARTRATE 10 MG PO TABS: 10.0000 mg | ORAL_TABLET | Freq: Every evening | ORAL | Status: DC | PRN

## 2014-11-20 NOTE — Telephone Encounter (Signed)
Aug, Sep, OCt F/U PCP for 6 month F/U pls. Dr Karma Greaser.  Need for Ambien detailed in 11/2013 note and benefits > risks. Will refill

## 2014-11-20 NOTE — Telephone Encounter (Signed)
Rx called in Message to front desk for appointment

## 2014-12-01 ENCOUNTER — Encounter (HOSPITAL_COMMUNITY): Payer: Self-pay | Admitting: Physical Medicine and Rehabilitation

## 2014-12-01 ENCOUNTER — Emergency Department (HOSPITAL_COMMUNITY)
Admission: EM | Admit: 2014-12-01 | Discharge: 2014-12-02 | Disposition: A | Discharge status: Home / Self Care | Payer: Medicare Other | Attending: Emergency Medicine | Admitting: Emergency Medicine

## 2014-12-01 ENCOUNTER — Emergency Department (HOSPITAL_COMMUNITY)
Admission: EM | Admit: 2014-12-01 | Discharge: 2014-12-01 | Discharge status: Against Medical Advice | Payer: Medicare Other

## 2014-12-01 DIAGNOSIS — Z8719 Personal history of other diseases of the digestive system: Secondary | ICD-10-CM | POA: Diagnosis not present

## 2014-12-01 DIAGNOSIS — R000 Tachycardia, unspecified: Secondary | ICD-10-CM | POA: Insufficient documentation

## 2014-12-01 DIAGNOSIS — R112 Nausea with vomiting, unspecified: Secondary | ICD-10-CM

## 2014-12-01 DIAGNOSIS — Z79899 Other long term (current) drug therapy: Secondary | ICD-10-CM | POA: Insufficient documentation

## 2014-12-01 DIAGNOSIS — G4700 Insomnia, unspecified: Secondary | ICD-10-CM | POA: Diagnosis not present

## 2014-12-01 DIAGNOSIS — Z720 Tobacco use: Secondary | ICD-10-CM | POA: Diagnosis not present

## 2014-12-01 DIAGNOSIS — R197 Diarrhea, unspecified: Secondary | ICD-10-CM | POA: Insufficient documentation

## 2014-12-01 DIAGNOSIS — F329 Major depressive disorder, single episode, unspecified: Secondary | ICD-10-CM | POA: Insufficient documentation

## 2014-12-01 DIAGNOSIS — R1084 Generalized abdominal pain: Secondary | ICD-10-CM | POA: Diagnosis present

## 2014-12-01 LAB — CBC WITH DIFFERENTIAL/PLATELET
BASOS ABS: 0 10*3/uL (ref 0.0–0.1)
BASOS PCT: 0 % (ref 0–1)
Eosinophils Absolute: 0 10*3/uL (ref 0.0–0.7)
Eosinophils Relative: 0 % (ref 0–5)
HCT: 46.8 % — ABNORMAL HIGH (ref 36.0–46.0)
HEMOGLOBIN: 15.5 g/dL — AB (ref 12.0–15.0)
Lymphocytes Relative: 11 % — ABNORMAL LOW (ref 12–46)
Lymphs Abs: 1.3 10*3/uL (ref 0.7–4.0)
MCH: 29.3 pg (ref 26.0–34.0)
MCHC: 33.1 g/dL (ref 30.0–36.0)
MCV: 88.5 fL (ref 78.0–100.0)
MONO ABS: 1.1 10*3/uL — AB (ref 0.1–1.0)
MONOS PCT: 9 % (ref 3–12)
NEUTROS ABS: 9.3 10*3/uL — AB (ref 1.7–7.7)
Neutrophils Relative %: 80 % — ABNORMAL HIGH (ref 43–77)
Platelets: 284 10*3/uL (ref 150–400)
RBC: 5.29 MIL/uL — ABNORMAL HIGH (ref 3.87–5.11)
RDW: 13.5 % (ref 11.5–15.5)
WBC: 11.8 10*3/uL — ABNORMAL HIGH (ref 4.0–10.5)

## 2014-12-01 LAB — COMPREHENSIVE METABOLIC PANEL
ALT: 19 U/L (ref 14–54)
AST: 28 U/L (ref 15–41)
Albumin: 4.1 g/dL (ref 3.5–5.0)
Alkaline Phosphatase: 105 U/L (ref 38–126)
Anion gap: 12 (ref 5–15)
BILIRUBIN TOTAL: 0.9 mg/dL (ref 0.3–1.2)
BUN: 17 mg/dL (ref 6–20)
CALCIUM: 9.5 mg/dL (ref 8.9–10.3)
CHLORIDE: 101 mmol/L (ref 101–111)
CO2: 26 mmol/L (ref 22–32)
Creatinine, Ser: 1.1 mg/dL — ABNORMAL HIGH (ref 0.44–1.00)
GFR calc Af Amer: >60 mL/min (ref >60)
GFR calc non Af Amer: 52 mL/min — ABNORMAL LOW (ref >60)
GLUCOSE: 115 mg/dL — AB (ref 65–99)
Potassium: 3.7 mmol/L (ref 3.5–5.1)
SODIUM: 139 mmol/L (ref 135–145)
TOTAL PROTEIN: 7.9 g/dL (ref 6.5–8.1)

## 2014-12-01 LAB — LIPASE, BLOOD: LIPASE: 31 U/L (ref 22–51)

## 2014-12-01 MED ORDER — MORPHINE SULFATE 4 MG/ML IJ SOLN
4.0000 mg | Freq: Once | INTRAMUSCULAR | Status: AC
  Administered 2014-12-01: 4 mg via INTRAVENOUS
  Filled 2014-12-01: qty 1

## 2014-12-01 MED ORDER — SODIUM CHLORIDE 0.9 % IV BOLUS (SEPSIS)
1000.0000 mL | Freq: Once | INTRAVENOUS | Status: AC
  Administered 2014-12-01: 1000 mL via INTRAVENOUS

## 2014-12-01 MED ORDER — ONDANSETRON HCL 4 MG/2ML IJ SOLN
4.0000 mg | Freq: Once | INTRAMUSCULAR | Status: AC
  Administered 2014-12-01: 4 mg via INTRAVENOUS
  Filled 2014-12-01: qty 2

## 2014-12-01 MED ORDER — ONDANSETRON HCL 4 MG/2ML IJ SOLN
4.0000 mg | Freq: Once | INTRAMUSCULAR | Status: AC
Start: 2014-12-01 — End: 2014-12-01
  Administered 2014-12-01: 4 mg via INTRAVENOUS
  Filled 2014-12-01: qty 2

## 2014-12-01 NOTE — ED Provider Notes (Signed)
CSN: [id number]     Arrival date & time 12/01/14  1642 History   First MD Initiated Contact with Patient 12/01/14 2005     Chief Complaint  Patient presents with  . Abdominal Pain  . Emesis  . Diarrhea     (Consider location/radiation/quality/duration/timing/severity/associated sxs/prior Treatment) HPI Comments: Patient has a history of Crohn's disease.  She states she was seen at [facility].  Last night for nausea, vomiting, abdominal pain.  She was given 1 L of fluid IV Zofran and a shot for pain and was discharged home.  She was given a prescription for Zofran but did not feel well enough this morning to fill it.  She is proceeded to have multiple episodes of vomiting throughout the day.  Several loose stools but not to diarrhea.  She has not noted blood in her emesis or stools.  She has not been hospitalized for Crohn's disease in every year.  She did not call her primary care physician because she thought it would be better by this evening  Patient is a 64 y.o. female presenting with abdominal pain, vomiting, and diarrhea. The history is provided by the patient.  Abdominal Pain Pain location:  Generalized Pain quality: aching   Pain radiates to:  Does not radiate Pain severity:  Moderate Onset quality:  Gradual Duration:  2 days Timing:  Constant Progression:  Unchanged Chronicity:  Recurrent Context: retching   Context: not diet changes, not eating, not laxative use, not suspicious food intake and not trauma   Relieved by:  None tried Worsened by:  Eating Ineffective treatments:  None tried Associated symptoms: diarrhea, nausea and vomiting   Associated symptoms: no chest pain, no fever and no shortness of breath   Emesis Associated symptoms: abdominal pain and diarrhea   Diarrhea Associated symptoms: abdominal pain and vomiting   Associated symptoms: no fever     Past Medical History  Diagnosis Date  . Crohn's disease 1980's     diagnosis made in 1980s, status  post multiple surgeries including ileostomy and total colectomy; November 29 2009 single contrast ileostomy enema performed and normal ileostomy study following total colectomy was noted with no evidence of Crohn's dz;  Marland Kitchen Depression   . Substance abuse     tobacco  . Insomnia   . 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 Colectomy  1980's     status post colectomy with right lower quadrant ileostomy, this was performed in 1980s and we have no details of the procedures,  however this was confirmed by multiple CTs of the abdomen and pelvis, last CT of the abdomen and pelvis June 18, 2010 showed no evidence of active Crohn's disease or complications  . Laparoscopy  04/13/2010     diagnostic laparoscopy, lysis of adhesions and small bowel resection with ileostomy revision, done by Dr. Dwain Sarna;  postoperative diagnoses =  difficulty with ileostomy and ileostomy stricture,  pathology report April 13, 2010 -->  ileostomy with focal ulceration, granulation tissue and fibrosis  . Colon stricture dilatation    . Bowel resection    . Tubal ligation    . Oophorectomy     Family History  Problem Relation Age of Onset  . Crohn's disease Brother   . Cancer Father     lung  . Heart disease Father     heart attack   History  Substance Use Topics  . Smoking status: Current Every Day Smoker -- 1.00 packs/day for 30 years    Types: Cigarettes  . Smokeless tobacco: Never Used     Comment:  pack a day.    . Alcohol Use: No   OB History    No data available     Review of Systems  Constitutional: Negative for fever.  Respiratory: Negative for shortness of breath.   Cardiovascular: Negative for chest pain.  Gastrointestinal: Positive for nausea, vomiting, abdominal pain and diarrhea. Negative for blood in stool and abdominal distention.      Allergies  Codeine  and Tramadol  Home Medications   Prior to Admission medications   Medication Sig Start Date End Date Taking? Authorizing Provider  alendronate (FOSAMAX) 10 MG tablet TAKE 1 TABLET BY MOUTH DAILY BEFORE BREAKFAST. TAKE WITH FULL GLASS OF WATER ON AN EMPTY STOMACH 01/23/14  Yes Lorenda Hatchet, MD  aspirin 325 MG tablet Take 325 mg by mouth daily.    Yes Historical Provider, MD  citalopram (CELEXA) 10 MG tablet Take 10 mg by mouth daily. 11/18/14  Yes Historical Provider, MD  gabapentin (NEURONTIN) 800 MG tablet Take 800 mg by mouth 6 (six) times daily. 11/18/14  Yes Historical Provider, MD  metoprolol succinate (TOPROL-XL) 25 MG 24 hr tablet Take 1 tablet (25 mg total) by mouth daily. 07/29/14  Yes Lorenda Hatchet, MD  mirtazapine (REMERON) 30 MG tablet Take 30 mg by mouth at bedtime. 11/18/14  Yes Historical Provider, MD  NUCYNTA 100 MG TABS Take 100 mg by mouth 5 (five) times daily. 10/31/14  Yes Historical Provider, MD  tiZANidine (ZANAFLEX) 4 MG tablet Take 4 mg by mouth 4 (four) times daily. 11/24/14  Yes Historical Provider, MD  zolpidem (AMBIEN) 10 MG tablet Take 1 tablet (10 mg total) by mouth at bedtime as needed. for sleep 11/20/14  Yes Burns Spain, MD  furosemide (LASIX) 20 MG tablet TAKE 1 TABLET (20 MG TOTAL) BY MOUTH DAILY. Patient not taking: Reported on 07/29/2014 01/23/14   Lorenda Hatchet, MD   BP 120/56 mmHg  Pulse 43  Temp(Src) 98.8 F (37.1 C) (Oral)  Resp 20  SpO2 95%  LMP 01/26/1990 Physical Exam  Constitutional: She appears well-developed and well-nourished.  HENT:  Head: Normocephalic.  Neck: Normal range of motion.  Cardiovascular: Regular rhythm.  Tachycardia present.   Pulmonary/Chest: Effort normal and breath sounds normal.  Abdominal: Soft. She exhibits no distension. There is no tenderness.  Musculoskeletal: Normal range of motion.  Neurological: She is alert.  Skin: Skin is warm. No rash noted.  Nursing note and vitals reviewed.   ED Course  Procedures  (including critical care time) Labs Review Labs Reviewed  CBC WITH DIFFERENTIAL/PLATELET - Abnormal; Notable for the following:    WBC 11.8 (*)    RBC 5.29 (*)    Hemoglobin 15.5 (*)    HCT 46.8 (*)    Neutrophils Relative % 80 (*)    Neutro Abs 9.3 (*)    Lymphocytes Relative 11 (*)    Monocytes Absolute 1.1 (*)    All other components within normal limits  COMPREHENSIVE METABOLIC PANEL - Abnormal; Notable for the following:    Glucose, Bld 115 (*)    Creatinine, Ser 1.10 (*)    GFR calc non Af Amer 52 (*)    All other components within normal limits  LIPASE, BLOOD  URINALYSIS, ROUTINE W REFLEX MICROSCOPIC (NOT AT [facility])    Imaging Review  No results found.   EKG Interpretation None     Patient has had no further episodes of vomiting.  She received 2 L of IV fluid, gelatin, left 250 cc of brown fluid through her ileostomy.  She is no longer having any pain.  She is able to tolerate fluids.  She's been encouraged to fill her prescription for Zofran that she received earlier in the day and follow-up with her primary care physician MDM   Final diagnoses:  Non-intractable vomiting with nausea, vomiting of unspecified type         Earley Favor, NP 12/02/14 0151  Margarita Grizzle, MD 12/05/14 1736

## 2014-12-01 NOTE — ED Notes (Signed)
Pt presents to department for evaluation of abdominal pain, nausea and diarrhea. History of Chron's Disease. Pt was seen at [facility] ER last night for same. 5/10 pain upon arrival to ED.

## 2014-12-01 NOTE — ED Notes (Signed)
NP at bedside.

## 2014-12-01 NOTE — ED Notes (Signed)
Pt called x 3  No answer. 

## 2014-12-02 NOTE — Discharge Instructions (Signed)
Nausea and Vomiting Nausea means you feel sick to your stomach. Throwing up (vomiting) is a reflex where stomach contents come out of your mouth. HOME CARE   Take medicine as told by your doctor.  Do not force yourself to eat. However, you do need to drink fluids.  If you feel like eating, eat a normal diet as told by your doctor.  Eat rice, wheat, potatoes, bread, lean meats, yogurt, fruits, and vegetables.  Avoid high-fat foods.  Drink enough fluids to keep your pee (urine) clear or pale yellow.  Ask your doctor how to replace body fluid losses (rehydrate). Signs of body fluid loss (dehydration) include:  Feeling very thirsty.  Dry lips and mouth.  Feeling dizzy.  Dark pee.  Peeing less than normal.  Feeling confused.  Fast breathing or heart rate. GET HELP RIGHT AWAY IF:   You have blood in your throw up.  You have black or bloody poop (stool).  You have a bad headache or stiff neck.  You feel confused.  You have bad belly (abdominal) pain.  You have chest pain or trouble breathing.  You do not pee at least once every 8 hours.  You have cold, clammy skin.  You keep throwing up after 24 to 48 hours.  You have a fever. MAKE SURE YOU:   Understand these instructions.  Will watch your condition.  Will get help right away if you are not doing well or get worse. Document Released: 10/18/2007 Document Revised: 07/24/2011 Document Reviewed: 09/30/2010 [facility] Patient Information 2015 [place], [place]. This information is not intended to replace advice given to you by your health care provider. Make sure you discuss any questions you have with your health care provider. Make sure to fill your prescription for Zofran received earlier in the days Follow up with your PCP as needed

## 2014-12-04 ENCOUNTER — Encounter (HOSPITAL_COMMUNITY): Payer: Self-pay | Admitting: Emergency Medicine

## 2014-12-04 ENCOUNTER — Observation Stay (HOSPITAL_COMMUNITY)
Admission: EM | Admit: 2014-12-04 | Discharge: 2014-12-07 | Disposition: A | Discharge status: Home / Self Care | Payer: Medicare Other | Attending: Oncology | Admitting: Oncology

## 2014-12-04 DIAGNOSIS — R262 Difficulty in walking, not elsewhere classified: Secondary | ICD-10-CM | POA: Diagnosis not present

## 2014-12-04 DIAGNOSIS — Z8679 Personal history of other diseases of the circulatory system: Secondary | ICD-10-CM

## 2014-12-04 DIAGNOSIS — G8929 Other chronic pain: Secondary | ICD-10-CM | POA: Diagnosis present

## 2014-12-04 DIAGNOSIS — R531 Weakness: Secondary | ICD-10-CM | POA: Insufficient documentation

## 2014-12-04 DIAGNOSIS — E46 Unspecified protein-calorie malnutrition: Secondary | ICD-10-CM | POA: Diagnosis present

## 2014-12-04 DIAGNOSIS — G4701 Insomnia due to medical condition: Secondary | ICD-10-CM | POA: Diagnosis present

## 2014-12-04 DIAGNOSIS — Z933 Colostomy status: Secondary | ICD-10-CM | POA: Diagnosis not present

## 2014-12-04 DIAGNOSIS — F329 Major depressive disorder, single episode, unspecified: Secondary | ICD-10-CM | POA: Diagnosis not present

## 2014-12-04 DIAGNOSIS — G4700 Insomnia, unspecified: Secondary | ICD-10-CM | POA: Diagnosis not present

## 2014-12-04 DIAGNOSIS — E43 Unspecified severe protein-calorie malnutrition: Secondary | ICD-10-CM | POA: Insufficient documentation

## 2014-12-04 DIAGNOSIS — F17210 Nicotine dependence, cigarettes, uncomplicated: Secondary | ICD-10-CM | POA: Insufficient documentation

## 2014-12-04 DIAGNOSIS — E860 Dehydration: Secondary | ICD-10-CM | POA: Insufficient documentation

## 2014-12-04 DIAGNOSIS — N179 Acute kidney failure, unspecified: Secondary | ICD-10-CM

## 2014-12-04 DIAGNOSIS — K5090 Crohn's disease, unspecified, without complications: Secondary | ICD-10-CM | POA: Diagnosis not present

## 2014-12-04 DIAGNOSIS — F4321 Adjustment disorder with depressed mood: Secondary | ICD-10-CM | POA: Diagnosis present

## 2014-12-04 DIAGNOSIS — E876 Hypokalemia: Secondary | ICD-10-CM | POA: Insufficient documentation

## 2014-12-04 DIAGNOSIS — R109 Unspecified abdominal pain: Secondary | ICD-10-CM

## 2014-12-04 LAB — COMPREHENSIVE METABOLIC PANEL
ALBUMIN: 4.7 g/dL (ref 3.5–5.0)
ALK PHOS: 100 U/L (ref 38–126)
ALT: 20 U/L (ref 14–54)
AST: 24 U/L (ref 15–41)
Anion gap: 25 — ABNORMAL HIGH (ref 5–15)
BUN: 31 mg/dL — ABNORMAL HIGH (ref 6–20)
CHLORIDE: 89 mmol/L — AB (ref 101–111)
CO2: 20 mmol/L — AB (ref 22–32)
Calcium: 9.8 mg/dL (ref 8.9–10.3)
Creatinine, Ser: 2.11 mg/dL — ABNORMAL HIGH (ref 0.44–1.00)
GFR calc Af Amer: 28 mL/min — ABNORMAL LOW (ref >60)
GFR calc non Af Amer: 24 mL/min — ABNORMAL LOW (ref >60)
Glucose, Bld: 163 mg/dL — ABNORMAL HIGH (ref 65–99)
Potassium: 3.3 mmol/L — ABNORMAL LOW (ref 3.5–5.1)
SODIUM: 134 mmol/L — AB (ref 135–145)
Total Bilirubin: 1.3 mg/dL — ABNORMAL HIGH (ref 0.3–1.2)
Total Protein: 9 g/dL — ABNORMAL HIGH (ref 6.5–8.1)

## 2014-12-04 LAB — CBC
HCT: 51.2 % — ABNORMAL HIGH (ref 36.0–46.0)
Hemoglobin: 18 g/dL — ABNORMAL HIGH (ref 12.0–15.0)
MCH: 29.9 pg (ref 26.0–34.0)
MCHC: 35.2 g/dL (ref 30.0–36.0)
MCV: 85 fL (ref 78.0–100.0)
Platelets: 356 10*3/uL (ref 150–400)
RBC: 6.02 MIL/uL — ABNORMAL HIGH (ref 3.87–5.11)
RDW: 13.2 % (ref 11.5–15.5)
WBC: 16.9 10*3/uL — ABNORMAL HIGH (ref 4.0–10.5)

## 2014-12-04 LAB — LIPASE, BLOOD: Lipase: 55 U/L — ABNORMAL HIGH (ref 22–51)

## 2014-12-04 NOTE — ED Provider Notes (Signed)
CSN: [id number]     Arrival date & time 12/04/14  2107 History  This chart was scribed for Misty Kaplan, MD by Freida Busman, ED Scribe. This patient was seen in room B19C/B19C and the patient's care was started 11:43 PM.    Chief Complaint  Patient presents with  . Abdominal Pain    The history is provided by the patient. No language interpreter was used.     HPI Comments:  Misty [name] is a 64 y.o. female with a history of Crohn's disease who presents to the Emergency Department complaining of 10/10 abdominal pain for about 2 weeks. She reports associated nausea, vomiting ~5-10 times a day and weight loss ~30 lbs in the last 2-3 months. She denies blood in her vomit. Pt has an Ileostomy in place and denies having to change the bag more than usual. Her pain is similar to pain felt with past chron's flare ups. Pt is not currently on any medications for her chron's but not she wears a Barista which she was give at the pain clinic   Pt also complains of palpitations, tingling in her extremities, lightheadedness and SOB that started today. She denies cardiac history. No alleviating factors noted. Pt smokes ~1ppd.  GI: [facility]     Past Medical History  Diagnosis Date  . Crohn's disease 1980's     diagnosis made in 1980s, status post multiple surgeries including ileostomy and total colectomy; November 29 2009 single contrast ileostomy enema performed and normal ileostomy study following total colectomy was noted with no evidence of Crohn's dz;  Marland Kitchen Depression   . Substance abuse     tobacco  . Insomnia   . 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 Colectomy  1980's     status post colectomy with right lower quadrant ileostomy, this was performed in 1980s and we have no details of the procedures,  however this was confirmed by multiple CTs of the abdomen  and pelvis, last CT of the abdomen and pelvis June 18, 2010 showed no evidence of active Crohn's disease or complications  . Laparoscopy  04/13/2010     diagnostic laparoscopy, lysis of adhesions and small bowel resection with ileostomy revision, done by Dr. Dwain Sarna;  postoperative diagnoses =  difficulty with ileostomy and ileostomy stricture,  pathology report April 13, 2010 -->  ileostomy with focal ulceration, granulation tissue and fibrosis  . Colon stricture dilatation    . Bowel resection    . Tubal ligation    . Oophorectomy     Family History  Problem Relation Age of Onset  . Crohn's disease Brother   . Cancer Father     lung  . Heart disease Father     heart attack   History  Substance Use Topics  . Smoking status: Current Every Day Smoker -- 1.00 packs/day for 30 years    Types: Cigarettes  . Smokeless tobacco: Never Used     Comment:  pack a day.  1/2 pk per day  . Alcohol Use: No   OB History    No data available     Review of Systems  Constitutional: Positive for unexpected weight change.  Respiratory: Positive for shortness of breath.   Cardiovascular: Positive for palpitations.  Gastrointestinal: Positive for nausea, vomiting and abdominal pain.  Neurological: Positive for light-headedness.  All other systems reviewed and  are negative.   A complete 10 system review of systems was obtained and all systems are negative except as noted in the HPI and PMH.    Allergies  Codeine and Tramadol  Home Medications   Prior to Admission medications   Medication Sig Start Date End Date Taking? Authorizing Provider  alendronate (FOSAMAX) 10 MG tablet TAKE 1 TABLET BY MOUTH DAILY BEFORE BREAKFAST. TAKE WITH FULL GLASS OF WATER ON AN EMPTY STOMACH 01/23/14  Yes Lorenda Hatchet, MD  aspirin 325 MG tablet Take 325 mg by mouth daily.    Yes Historical Provider, MD  metoprolol succinate (TOPROL-XL) 25 MG 24 hr tablet Take 1 tablet (25 mg total) by mouth daily.  07/29/14  Yes Lorenda Hatchet, MD  mirtazapine (REMERON) 30 MG tablet Take 30 mg by mouth at bedtime. 11/18/14  Yes Historical Provider, MD  NUCYNTA 100 MG TABS Take 100 mg by mouth 5 (five) times daily. 10/31/14  Yes Historical Provider, MD  tiZANidine (ZANAFLEX) 4 MG tablet Take 4 mg by mouth 4 (four) times daily. 11/24/14  Yes Historical Provider, MD  zolpidem (AMBIEN) 10 MG tablet Take 1 tablet (10 mg total) by mouth at bedtime as needed. for sleep 11/20/14  Yes Burns Spain, MD  citalopram (CELEXA) 20 MG tablet Take 1 tablet (20 mg total) by mouth daily. Patient not taking: Reported on 12/09/2014 12/07/14   John Giovanni, MD  gabapentin (NEURONTIN) 800 MG tablet Take 1 tablet (800 mg total) by mouth 3 (three) times daily. 12/07/14   John Giovanni, MD   BP 101/57 mmHg  Pulse 66  Temp(Src) 99.2 F (37.3 C) (Oral)  Resp 18  Ht 5\' 5"  (1.651 m)  Wt 87 lb 14.4 oz (39.871 kg)  BMI 14.63 kg/m2  SpO2 98%  LMP 01/26/1990 Physical Exam  Constitutional: She appears cachectic.  HENT:  Head: Normocephalic and atraumatic.  Mouth/Throat: Mucous membranes are dry.  Eyes: Conjunctivae are normal.  Neck: Normal range of motion.  Cardiovascular: Normal rate.   Pulmonary/Chest: Effort normal and breath sounds normal. No respiratory distress.  Abdominal: There is tenderness (Diffuse). There is guarding.  Ileostomy bag noted with dark green stool   Musculoskeletal: Normal range of motion.  Neurological: She is alert.  Skin: Skin is warm and dry.  Nursing note and vitals reviewed.   ED Course  Procedures   DIAGNOSTIC STUDIES:  Oxygen Saturation is 96% on RA, normal by my interpretation.    COORDINATION OF CARE:  11:53 PM Discussed treatment plan with pt at bedside and pt agreed to plan.  2:01 AM: Ultrasound guided iv placed  Labs Review Labs Reviewed  LIPASE, BLOOD - Abnormal; Notable for the following:    Lipase 55 (*)    All other components within normal limits   COMPREHENSIVE METABOLIC PANEL - Abnormal; Notable for the following:    Sodium 134 (*)    Potassium 3.3 (*)    Chloride 89 (*)    CO2 20 (*)    Glucose, Bld 163 (*)    BUN 31 (*)    Creatinine, Ser 2.11 (*)    Total Protein 9.0 (*)    Total Bilirubin 1.3 (*)    GFR calc non Af Amer 24 (*)    GFR calc Af Amer 28 (*)    Anion gap 25 (*)    All other components within normal limits  CBC - Abnormal; Notable for the following:    WBC 16.9 (*)    RBC 6.02 (*)  Hemoglobin 18.0 (*)    HCT 51.2 (*)    All other components within normal limits  URINALYSIS, ROUTINE W REFLEX MICROSCOPIC (NOT AT [facility]) - Abnormal; Notable for the following:    APPearance CLOUDY (*)    Hgb urine dipstick TRACE (*)    Bilirubin Urine MODERATE (*)    Ketones, ur 15 (*)    Protein, ur 30 (*)    Leukocytes, UA SMALL (*)    All other components within normal limits  URINE MICROSCOPIC-ADD ON - Abnormal; Notable for the following:    Squamous Epithelial / LPF MANY (*)    Bacteria, UA FEW (*)    Casts HYALINE CASTS (*)    All other components within normal limits  BASIC METABOLIC PANEL - Abnormal; Notable for the following:    Potassium 3.2 (*)    Chloride 96 (*)    BUN 32 (*)    Creatinine, Ser 1.21 (*)    GFR calc non Af Amer 47 (*)    GFR calc Af Amer 54 (*)    All other components within normal limits  CBC - Abnormal; Notable for the following:    WBC 12.7 (*)    RBC 5.57 (*)    Hemoglobin 16.4 (*)    HCT 48.2 (*)    All other components within normal limits  CBC - Abnormal; Notable for the following:    Platelets 148 (*)    All other components within normal limits  BASIC METABOLIC PANEL - Abnormal; Notable for the following:    Potassium 3.2 (*)    BUN 22 (*)    Calcium 8.2 (*)    All other components within normal limits  BASIC METABOLIC PANEL - Abnormal; Notable for the following:    Potassium 3.1 (*)    Calcium 8.5 (*)    All other components within normal limits  I-STAT CG4 LACTIC  ACID, ED - Abnormal; Notable for the following:    Lactic Acid, Venous 2.65 (*)    All other components within normal limits  URINE CULTURE  MAGNESIUM  LACTIC ACID, PLASMA  MAGNESIUM  MAGNESIUM    Imaging Review No results found.   EKG Interpretation   Date/Time:  Saturday December 05 2014 00:05:27 EDT Ventricular Rate:  93 PR Interval:  135 QRS Duration: 80 QT Interval:  465 QTC Calculation: 578 R Axis:   -63 Text Interpretation:  Sinus rhythm Right atrial enlargement Prolonged QT  interval or possible u waves Non-specific abnormality, ST segment, and/or  T-wave Confirmed by Rhunette Croft, MD, Janey Genta [phone]) on 12/05/2014 12:15:35 AM      MDM   Final diagnoses:  Crohn's disease, acute, without complications  AKI (acute kidney injury)    I personally performed the services described in this documentation, which was scribed in my presence. The recorded information has been reviewed and is accurate.  Pt comes in with cc of abd pain, emesis. She has hx of crohns. ROs + for dizziness, weight loss. She appears slightly dry, and pale. No blood in the stool.  Labs came back - and pt has AKI, anion gap, with likely metabolic acidosis as the bicarb is low and WC and lipase elevation. Her tenderness is diffuse.  Angiocath insertion Performed by: Misty [name]  Consent: Verbal consent obtained. Risks and benefits: risks, benefits and alternatives were discussed Time out: Immediately prior to procedure a "time out" was called to verify the correct patient, procedure, equipment, support staff and site/side marked as required.  Preparation: Patient  was prepped and draped in the usual sterile fashion.  Vein Location: Right antecubital fossa  Ultrasound Guided: Yes  Gauge: 20 gauge  Normal blood return and flush without difficulty Patient tolerance: Patient tolerated the procedure well with no immediate complications.    IV hydration started and CT oral contrast  ordered.   Misty Kaplan, MD 12/12/14 [phone]

## 2014-12-04 NOTE — ED Notes (Signed)
C/o chronic abd pain x 2 weeks with nausea, vomiting, and diarrhea.  States she has crohn's disease and has lost 30 lbs in the last month.

## 2014-12-05 ENCOUNTER — Emergency Department (HOSPITAL_COMMUNITY): Payer: Medicare Other

## 2014-12-05 ENCOUNTER — Encounter (HOSPITAL_COMMUNITY): Payer: Self-pay

## 2014-12-05 DIAGNOSIS — E43 Unspecified severe protein-calorie malnutrition: Secondary | ICD-10-CM | POA: Insufficient documentation

## 2014-12-05 LAB — CBC
HCT: 48.2 % — ABNORMAL HIGH (ref 36.0–46.0)
HEMOGLOBIN: 16.4 g/dL — AB (ref 12.0–15.0)
MCH: 29.4 pg (ref 26.0–34.0)
MCHC: 34 g/dL (ref 30.0–36.0)
MCV: 86.5 fL (ref 78.0–100.0)
Platelets: 260 10*3/uL (ref 150–400)
RBC: 5.57 MIL/uL — ABNORMAL HIGH (ref 3.87–5.11)
RDW: 13.4 % (ref 11.5–15.5)
WBC: 12.7 10*3/uL — AB (ref 4.0–10.5)

## 2014-12-05 LAB — URINALYSIS, ROUTINE W REFLEX MICROSCOPIC
GLUCOSE, UA: NEGATIVE mg/dL
Ketones, ur: 15 mg/dL — AB
NITRITE: NEGATIVE
PROTEIN: 30 mg/dL — AB
Specific Gravity, Urine: 1.017 (ref 1.005–1.030)
UROBILINOGEN UA: 0.2 mg/dL (ref 0.0–1.0)
pH: 5 (ref 5.0–8.0)

## 2014-12-05 LAB — LACTIC ACID, PLASMA: LACTIC ACID, VENOUS: 1.1 mmol/L (ref 0.5–2.0)

## 2014-12-05 LAB — BASIC METABOLIC PANEL
Anion gap: 14 (ref 5–15)
BUN: 32 mg/dL — AB (ref 6–20)
CALCIUM: 8.9 mg/dL (ref 8.9–10.3)
CHLORIDE: 96 mmol/L — AB (ref 101–111)
CO2: 26 mmol/L (ref 22–32)
Creatinine, Ser: 1.21 mg/dL — ABNORMAL HIGH (ref 0.44–1.00)
GFR calc Af Amer: 54 mL/min — ABNORMAL LOW (ref >60)
GFR calc non Af Amer: 47 mL/min — ABNORMAL LOW (ref >60)
Glucose, Bld: 93 mg/dL (ref 65–99)
Potassium: 3.2 mmol/L — ABNORMAL LOW (ref 3.5–5.1)
Sodium: 136 mmol/L (ref 135–145)

## 2014-12-05 LAB — URINE MICROSCOPIC-ADD ON

## 2014-12-05 LAB — I-STAT CG4 LACTIC ACID, ED: Lactic Acid, Venous: 2.65 mmol/L (ref 0.5–2.0)

## 2014-12-05 LAB — MAGNESIUM: Magnesium: 2.2 mg/dL (ref 1.7–2.4)

## 2014-12-05 MED ORDER — SODIUM CHLORIDE 0.9 % IV BOLUS (SEPSIS)
1000.0000 mL | Freq: Once | INTRAVENOUS | Status: AC
  Administered 2014-12-05: 1000 mL via INTRAVENOUS

## 2014-12-05 MED ORDER — ZOLPIDEM TARTRATE 5 MG PO TABS
5.0000 mg | ORAL_TABLET | Freq: Every evening | ORAL | Status: DC | PRN
  Administered 2014-12-05 – 2014-12-06 (×2): 5 mg via ORAL
  Filled 2014-12-05 (×2): qty 1

## 2014-12-05 MED ORDER — BOOST / RESOURCE BREEZE PO LIQD
1.0000 | Freq: Three times a day (TID) | ORAL | Status: DC
  Administered 2014-12-05 – 2014-12-06 (×3): 1 via ORAL

## 2014-12-05 MED ORDER — HYDROMORPHONE HCL 1 MG/ML IJ SOLN
1.0000 mg | INTRAMUSCULAR | Status: DC | PRN
  Administered 2014-12-05 – 2014-12-06 (×9): 1 mg via INTRAVENOUS
  Filled 2014-12-05 (×9): qty 1

## 2014-12-05 MED ORDER — HYDROMORPHONE HCL 1 MG/ML IJ SOLN
0.5000 mg | Freq: Once | INTRAMUSCULAR | Status: AC
  Administered 2014-12-05: 0.5 mg via INTRAVENOUS
  Filled 2014-12-05: qty 1

## 2014-12-05 MED ORDER — ONDANSETRON HCL 4 MG PO TABS: 4.0000 mg | ORAL_TABLET | Freq: Four times a day (QID) | ORAL | Status: DC | PRN

## 2014-12-05 MED ORDER — MORPHINE SULFATE 4 MG/ML IJ SOLN
4.0000 mg | Freq: Once | INTRAMUSCULAR | Status: AC
  Administered 2014-12-05: 4 mg via INTRAVENOUS
  Filled 2014-12-05: qty 1

## 2014-12-05 MED ORDER — ONDANSETRON HCL 4 MG/2ML IJ SOLN: 4.0000 mg | Freq: Once | INTRAMUSCULAR | Status: DC

## 2014-12-05 MED ORDER — SODIUM CHLORIDE 0.9 % IV SOLN
INTRAVENOUS | Status: DC
  Administered 2014-12-05 – 2014-12-06 (×3): via INTRAVENOUS

## 2014-12-05 MED ORDER — ONDANSETRON HCL 4 MG/2ML IJ SOLN
4.0000 mg | Freq: Once | INTRAMUSCULAR | Status: AC
  Administered 2014-12-05: 4 mg via INTRAVENOUS
  Filled 2014-12-05: qty 2

## 2014-12-05 MED ORDER — SODIUM CHLORIDE 0.9 % IV SOLN
10.0000 mL/h | Freq: Once | INTRAVENOUS | Status: DC
Start: 2014-12-05 — End: 2014-12-07

## 2014-12-05 MED ORDER — HYDROMORPHONE HCL 1 MG/ML IJ SOLN: 0.5000 mg | Freq: Once | INTRAMUSCULAR | Status: DC

## 2014-12-05 MED ORDER — CITALOPRAM HYDROBROMIDE 20 MG PO TABS
10.0000 mg | ORAL_TABLET | Freq: Every day | ORAL | Status: DC
  Administered 2014-12-05 – 2014-12-06 (×2): 10 mg via ORAL
  Filled 2014-12-05 (×2): qty 1

## 2014-12-05 MED ORDER — ENOXAPARIN SODIUM 30 MG/0.3ML ~~LOC~~ SOLN
30.0000 mg | SUBCUTANEOUS | Status: DC
  Administered 2014-12-05 – 2014-12-07 (×3): 30 mg via SUBCUTANEOUS
  Filled 2014-12-05 (×3): qty 0.3

## 2014-12-05 MED ORDER — ONDANSETRON HCL 4 MG/2ML IJ SOLN: 4.0000 mg | Freq: Four times a day (QID) | INTRAMUSCULAR | Status: DC | PRN

## 2014-12-05 MED ORDER — IOHEXOL 300 MG/ML  SOLN
50.0000 mL | INTRAMUSCULAR | Status: AC
  Administered 2014-12-05: 50 mL via ORAL

## 2014-12-05 MED ORDER — MIRTAZAPINE 30 MG PO TABS
30.0000 mg | ORAL_TABLET | Freq: Every day | ORAL | Status: DC
  Administered 2014-12-05 – 2014-12-06 (×2): 30 mg via ORAL
  Filled 2014-12-05 (×4): qty 1

## 2014-12-05 MED ORDER — TIZANIDINE HCL 2 MG PO TABS: 4.0000 mg | ORAL_TABLET | Freq: Four times a day (QID) | ORAL | Status: DC

## 2014-12-05 MED ORDER — PROMETHAZINE HCL 25 MG/ML IJ SOLN
12.5000 mg | Freq: Four times a day (QID) | INTRAMUSCULAR | Status: DC | PRN
  Administered 2014-12-05 – 2014-12-07 (×6): 12.5 mg via INTRAVENOUS
  Filled 2014-12-05 (×6): qty 1

## 2014-12-05 MED ORDER — METOPROLOL SUCCINATE ER 25 MG PO TB24
25.0000 mg | ORAL_TABLET | Freq: Every day | ORAL | Status: DC
  Administered 2014-12-05 – 2014-12-07 (×2): 25 mg via ORAL
  Filled 2014-12-05 (×3): qty 1

## 2014-12-05 MED ORDER — POTASSIUM CHLORIDE CRYS ER 20 MEQ PO TBCR
40.0000 meq | EXTENDED_RELEASE_TABLET | Freq: Once | ORAL | Status: AC
  Administered 2014-12-05: 40 meq via ORAL
  Filled 2014-12-05: qty 2

## 2014-12-05 NOTE — ED Notes (Signed)
Dr Rhunette Croft in room to attempt Ultrasound IV access

## 2014-12-05 NOTE — Progress Notes (Signed)
Initial Nutrition Assessment  DOCUMENTATION CODES:   Severe malnutrition in context of chronic illness, Underweight  INTERVENTION:    Boost Breeze PO TID as tolerated.  Recommend TPN. If started, monitor magnesium, phosphorus, and potassium as patient is at risk for refeeding syndrome.  NUTRITION DIAGNOSIS:   Malnutrition related to chronic illness as evidenced by severe depletion of body fat, severe depletion of muscle mass.  GOAL:   Patient will meet greater than or equal to 90% of their needs  MONITOR:   PO intake, Supplement acceptance, Diet advancement, Labs, Weight trends  REASON FOR ASSESSMENT:   Consult Assessment of nutrition requirement/status  ASSESSMENT:   65 yo F with a PMHx of Crohn's disease s/p colectomy and right lower quadrant ileostomy in the 1980s presenting with 10/10 abdominal pain, nausea and vomiting.  Discussed patient with RN. Nutrition-Focused physical exam completed. Findings are severe fat depletion, severe muscle depletion, and no edema. Patient reports recent inability to eat or drink much due to extreme pain with eating and drinking. She does not like PO supplements, but agreed to try [facility] when she feels better. When she feels good, she can tolerate most anything, but when she's feeling bad, she eats and drinks minimally. She has lost 22% of her usual weight since March. She is severely malnourished.    Diet Order:  Diet clear liquid Room service appropriate?: Yes; Fluid consistency:: Thin  Skin:  Reviewed, no issues  Last BM:  7/23 ileostomy  Height:   Ht Readings from Last 1 Encounters:  12/05/14 5\' 5"  (1.651 m)    Weight:   Wt Readings from Last 1 Encounters:  12/04/14 80 lb 3.2 oz (36.378 kg)    Ideal Body Weight:  56.8 kg  Wt Readings from Last 10 Encounters:  12/04/14 80 lb 3.2 oz (36.378 kg)  07/29/14 103 lb 9.6 oz (46.993 kg)  01/07/14 120 lb 9.6 oz (54.704 kg)  11/26/13 119 lb 14.4 oz (54.386 kg)  07/23/13  112 lb 3.2 oz (50.894 kg)  03/27/12 100 lb 12.8 oz (45.723 kg)  03/12/12 100 lb (45.36 kg)  12/07/11 96 lb 12.8 oz (43.908 kg)  09/06/11 108 lb 1.6 oz (49.034 kg)  08/23/11 108 lb 11.2 oz (49.306 kg)    BMI:  Body mass index is 13.35 kg/(m^2).  Estimated Nutritional Needs:   Kcal:  1250-1450  Protein:  65-75 gm  Fluid:  1.5 L  EDUCATION NEEDS:   Education needs addressed   Joaquin Courts, RD, LDN, CNSC Pager [phone] After Hours Pager [phone]

## 2014-12-05 NOTE — Evaluation (Signed)
Physical Therapy Evaluation Patient Details Name: Misty [name] MRN: [id number] DOB: [birth year] Today's Date: 12/05/2014   History of Present Illness  Patient is a 64 yo female admitted 12/04/14 with abdominal pain, N/V, weight loss.  Patient with regional enteritis, Crohn's flare.  PMH:  Colectomy and ileostomy 1980's, depression, V-tach  Clinical Impression  Patient presents with problems listed below.  Will benefit from acute PT to maximize functional independence prior to discharge.  Patient very weak and deconditioned.  Able to tolerate only minimal activity/mobility.  Recommended HHPT at discharge for patient - patient declined. Concerned about patient's ability to care for herself (and her animals) at home alone.   Would patient benefit from ALF/SNF prior to discharge home (self-care, nutrition, mobility)?     Follow Up Recommendations SNF; or ALF with Home health PT;Supervision for OOB/mobility; (Patient declines HHPT in her home)    Equipment Recommendations  Other (comment) (TBD)    Recommendations for Other Services       Precautions / Restrictions Precautions Precautions: None Precaution Comments: Has ileostomy - leaking Restrictions Weight Bearing Restrictions: No      Mobility  Bed Mobility Overal bed mobility: Modified Independent                Transfers Overall transfer level: Needs assistance Equipment used: None Transfers: Sit to/from Stand Sit to Stand: Min assist         General transfer comment: Assist to steady during transitions  Ambulation/Gait Ambulation/Gait assistance: Min assist Ambulation Distance (Feet): 62 Feet Assistive device: None Gait Pattern/deviations: Step-through pattern;Decreased stride length;Shuffle;Staggering right;Staggering left Gait velocity: Decreased Gait velocity interpretation: Below normal speed for age/gender General Gait Details: Patient with unsteady gait, staggering to both sides.  Patient with DOE and  fatigues quickly (at 40').  Reports legs are weak and "burning" with gait.     Stairs            Wheelchair Mobility    Modified Rankin (Stroke Patients Only)       Balance Overall balance assessment: Needs assistance         Standing balance support: No upper extremity supported Standing balance-Leahy Scale: Fair Standing balance comment: Assist with dynamic activities/gait                             Pertinent Vitals/Pain Pain Assessment: 0-10 Pain Score: 6  Pain Location: Upper abdomen Pain Descriptors / Indicators: Aching;Cramping Pain Intervention(s): Monitored during session;Repositioned    Home Living Family/patient expects to be discharged to:: Private residence Living Arrangements: Alone Available Help at Discharge: Family;Available PRN/intermittently (Patient reports sister and brother can help when needed.) Type of Home: Mobile home Home Access: Stairs to enter Entrance Stairs-Rails: Doctor, general practice of Steps: 4 Home Layout: One level Home Equipment: None Additional Comments: Patient reports that she has 30-35 animals at home ([employer], has house pets, chickens, turkeys, Catering manager.    Prior Function Level of Independence: Independent         Comments: Patient reports she drives     Hand Dominance        Extremity/Trunk Assessment   Upper Extremity Assessment: Generalized weakness           Lower Extremity Assessment: Generalized weakness         Communication   Communication: No difficulties  Cognition Arousal/Alertness: Awake/alert Behavior During Therapy: Flat affect Overall Cognitive Status: Within Functional Limits for tasks assessed  General Comments General comments (skin integrity, edema, etc.): Very thin/frail    Exercises        Assessment/Plan    PT Assessment Patient needs continued PT services  PT Diagnosis Difficulty walking;Abnormality of  gait;Generalized weakness;Acute pain   PT Problem List Decreased strength;Decreased activity tolerance;Decreased balance;Decreased mobility;Cardiopulmonary status limiting activity;Pain  PT Treatment Interventions DME instruction;Gait training;Stair training;Therapeutic activities;Functional mobility training;Therapeutic exercise;Balance training;Patient/family education   PT Goals (Current goals can be found in the Care Plan section) Acute Rehab PT Goals Patient Stated Goal: To return home PT Goal Formulation: With patient Time For Goal Achievement: 12/12/14 Potential to Achieve Goals: Good    Frequency Min 3X/week   Barriers to discharge Decreased caregiver support Patient lives alone    Co-evaluation               End of Session   Activity Tolerance: Patient limited by fatigue;Patient limited by pain Patient left: in bed;with call bell/phone within reach Nurse Communication: Mobility status (Concern for living situation)    Functional Assessment Tool Used: Clinical judgement Functional Limitation: Mobility: Walking and moving around Mobility: Walking and Moving Around Current Status ([id number]): At least 20 percent but less than 40 percent impaired, limited or restricted Mobility: Walking and Moving Around Goal Status [phone]): At least 1 percent but less than 20 percent impaired, limited or restricted    Time: 1746-1802 PT Time Calculation (min) (ACUTE ONLY): 16 min   Charges:   PT Evaluation $Initial PT Evaluation Tier I: 1 Procedure     PT G Codes:   PT G-Codes **NOT FOR INPATIENT CLASS** Functional Assessment Tool Used: Clinical judgement Functional Limitation: Mobility: Walking and moving around Mobility: Walking and Moving Around Current Status (W1027): At least 20 percent but less than 40 percent impaired, limited or restricted Mobility: Walking and Moving Around Goal Status [phone]): At least 1 percent but less than 20 percent impaired, limited or restricted     Vena Austria 12/05/2014, 6:36 PM Durenda Hurt. Renaldo Fiddler, [facility] Acute Rehab Services Pager [phone]

## 2014-12-05 NOTE — ED Notes (Signed)
Pt unable to give urine specimen at this time 

## 2014-12-05 NOTE — ED Notes (Signed)
2 unsuccessful attempts at IV access by this RN 

## 2014-12-05 NOTE — ED Notes (Signed)
CT notified that pt is done drinking contrast

## 2014-12-05 NOTE — H&P (Signed)
Date: 12/05/2014               Patient Name:  Misty [name] MRN: [id number]  DOB: 01-07-1951 Age / Sex: 64 y.o., female   PCP: Valentino Nose, MD         Medical Service: Internal Medicine Teaching Service         Attending Physician: Dr. Levert Feinstein, MD    First Contact: Dr. Loney Loh Pager: [phone]  Second Contact: Dr. Yetta Barre Pager: [phone]       After Hours (After 5p/  First Contact Pager: [phone]  weekends / holidays): Second Contact Pager: [phone]   Chief Complaint: Abdominal pian, nausea, vomiting, weight loss  History of Present Illness: Pt is a 64 yo F with a PMHx of Crohn's disease s/p colectomy and right lower quadrant ileostomy in the 1980s  presenting with 10/10 abdominal pain, nausea and vomiting. States her symptoms started about 2 weeks ago and she believes this is another flare up of her Crohn's disease. According to pt, she has had several Crohn's flare ups in the past and the synptoms were similar. States her abdominal pain is throbbing in nature and present bilaterally in the upper region of the abdomen. States she feels weak, tired, lightheaded since she has been vomiting every few hours at home. Denies any hematemesis. Pt has a colostomy bag in place and denies noticing any blood in it.  States she has not been passing stool in the bag. Pt also reports loosing 30 lbs of weight in the past 3-4 weeks. States she has not followed up with her Gastroenterologist in the past 6 months. She currently uses Butran patches prescribed to her by the pain clinic but has never been on any medication for her Crohn's. Denies HA, fever, chills, CP, SOB, or dysuria. No other complaints.   Pt was last seen at [facility] ED on 7/18 for a flare up of her Crohns. In the ED today she was given 1 L bolus of fluid, Dilaudid, Morphine, and Zofran.    Meds: Current Facility-Administered Medications  Medication Dose Route Frequency Provider Last Rate Last Dose  . 0.9 %  sodium chloride  infusion  10 mL/hr Intravenous Once [name], MD   10 mL/hr at 12/05/14 0450  . 0.9 %  sodium chloride infusion   Intravenous Continuous Courtney Paris, MD      . citalopram (CELEXA) tablet 10 mg  10 mg Oral Daily Marrian Salvage, MD   10 mg at 12/05/14 0915  . enoxaparin (LOVENOX) injection 30 mg  30 mg Subcutaneous Q24H Marrian Salvage, MD   30 mg at 12/05/14 0926  . feeding supplement (BOOST / RESOURCE BREEZE) liquid 1 Container  1 Container Oral TID BM Levert Feinstein, MD      . HYDROmorphone (DILAUDID) injection 1 mg  1 mg Intravenous Q3H PRN Marrian Salvage, MD   1 mg at 12/05/14 0916  . metoprolol succinate (TOPROL-XL) 24 hr tablet 25 mg  25 mg Oral Q breakfast Marrian Salvage, MD   25 mg at 12/05/14 0915  . mirtazapine (REMERON) tablet 30 mg  30 mg Oral QHS Marrian Salvage, MD      . promethazine (PHENERGAN) injection 12.5 mg  12.5 mg Intravenous Q6H PRN Marrian Salvage, MD   12.5 mg at 12/05/14 0916  . zolpidem (AMBIEN) tablet 5 mg  5 mg Oral QHS PRN Marrian Salvage, MD        Allergies:  Allergies as of 12/04/2014 - Review Complete 12/01/2014  Allergen Reaction Noted  . Codeine    . Tramadol Nausea And Vomiting 11/15/2010   Past Medical History  Diagnosis Date  . Crohn's disease 1980's     diagnosis made in 1980s, status post multiple surgeries including ileostomy and total colectomy; November 29 2009 single contrast ileostomy enema performed and normal ileostomy study following total colectomy was noted with no evidence of Crohn's dz;  Marland Kitchen Depression   . Substance abuse     tobacco  . Insomnia   . 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 Colectomy  1980's     status post colectomy with right lower quadrant ileostomy, this was performed in 1980s and we have no details of the procedures,  however this was confirmed by multiple CTs of the  abdomen and pelvis, last CT of the abdomen and pelvis June 18, 2010 showed no evidence of active Crohn's disease or complications  . Laparoscopy  04/13/2010     diagnostic laparoscopy, lysis of adhesions and small bowel resection with ileostomy revision, done by Dr. Dwain Sarna;  postoperative diagnoses =  difficulty with ileostomy and ileostomy stricture,  pathology report April 13, 2010 -->  ileostomy with focal ulceration, granulation tissue and fibrosis  . Colon stricture dilatation    . Bowel resection    . Tubal ligation    . Oophorectomy     Family History  Problem Relation Age of Onset  . Crohn's disease Brother   . Cancer Father     lung  . Heart disease Father     heart attack   History   Social History  . Marital Status: Divorced    Spouse Name: N/A  . Number of Children: N/A  . Years of Education: N/A   Occupational History  .      disability   Social History Main Topics  . Smoking status: Current Every Day Smoker -- 1.00 packs/day for 30 years    Types: Cigarettes  . Smokeless tobacco: Never Used     Comment:  pack a day.    . Alcohol Use: No  . Drug Use: No  . Sexual Activity: Not on file   Other Topics Concern  . Not on file   Social History Narrative    Review of Systems: Review of Systems  Constitutional: Positive for weight loss and malaise/fatigue. Negative for fever, chills and diaphoresis.  HENT: Negative for congestion and ear pain.   Eyes: Negative for blurred vision, pain and discharge.  Respiratory: Negative for cough, hemoptysis, sputum production, shortness of breath and wheezing.   Cardiovascular: Negative for chest pain, orthopnea and leg swelling.  Gastrointestinal: Positive for nausea, vomiting and abdominal pain. Negative for blood in stool.  Genitourinary: Negative for dysuria and urgency.  Musculoskeletal: Negative for myalgias and joint pain.  Skin: Negative for itching and rash.  Neurological: Positive for dizziness and  weakness. Negative for headaches.    Physical Exam: Blood pressure 129/67, pulse 84, temperature 97.8 F (36.6 C), temperature source Oral, resp. rate 17, weight 80 lb 3.2 oz (36.378 kg), last menstrual period 01/26/1990, SpO2 100 %. Physical Exam  Constitutional: She is oriented to person, place, and time. No distress.  Cachectic   HENT:  Head: Normocephalic and atraumatic.  Eyes: EOM are normal. Pupils are equal, round, and reactive to light.  Neck:  Normal range of motion. Neck supple. No tracheal deviation present.  Cardiovascular: Normal rate, regular rhythm and normal heart sounds.   Pulmonary/Chest: No respiratory distress. She has no wheezes. She has no rales.  Abdominal: She exhibits no distension. There is tenderness. There is no rebound and no guarding.  Ileostomy bag in RLQ draining green bile like fluid  Ileostomy bag has stomal leak  +BS  Musculoskeletal: Normal range of motion. She exhibits no edema.  Neurological: She is alert and oriented to person, place, and time. No cranial nerve deficit.  Skin: Skin is warm and dry. She is not diaphoretic.    Lab results: Basic Metabolic Panel:  Recent Labs  16/10/96 2122 12/05/14 0021  NA 134*  --   K 3.3*  --   CL 89*  --   CO2 20*  --   GLUCOSE 163*  --   BUN 31*  --   CREATININE 2.11*  --   CALCIUM 9.8  --   MG  --  2.2   Liver Function Tests:  Recent Labs  12/04/14 2122  AST 24  ALT 20  ALKPHOS 100  BILITOT 1.3*  PROT 9.0*  ALBUMIN 4.7    Recent Labs  12/04/14 2122  LIPASE 55*   No results for input(s): AMMONIA in the last 72 hours. CBC:  Recent Labs  12/04/14 2122  WBC 16.9*  HGB 18.0*  HCT 51.2*  MCV 85.0  PLT 356   Urinalysis:  Recent Labs  12/05/14 0417  COLORURINE YELLOW  LABSPEC 1.017  PHURINE 5.0  GLUCOSEU NEGATIVE  HGBUR TRACE*  BILIRUBINUR MODERATE*  KETONESUR 15*  PROTEINUR 30*  UROBILINOGEN 0.2  NITRITE NEGATIVE  LEUKOCYTESUR SMALL*   Imaging results:  Ct  Abdomen Pelvis Wo Contrast  12/05/2014   CLINICAL DATA:  Subacute onset of generalized abdominal pain, nausea, vomiting and diarrhea. Has lost 30 lbs in the past month. Initial encounter.  EXAM: CT ABDOMEN AND PELVIS WITHOUT CONTRAST  TECHNIQUE: Multidetector CT imaging of the abdomen and pelvis was performed following the standard protocol without IV contrast.  COMPARISON:  CT of the abdomen and pelvis from 06/18/2010  FINDINGS: The visualized lung bases are clear.  The liver and spleen are unremarkable in appearance. The gallbladder is within normal limits. The pancreas and adrenal glands are unremarkable.  A right-sided pelvic kidney is noted. The kidneys are grossly unremarkable in appearance. There is no evidence of hydronephrosis. No renal or ureteral stones are seen. No perinephric stranding is appreciated.  No free fluid is identified. The small bowel is unremarkable in appearance. The stomach is within normal limits. No acute vascular abnormalities are seen. Scattered calcification is noted along the abdominal aorta and its branches.  The patient is status post colectomy. A right lower quadrant ileostomy is grossly unremarkable in appearance.  The bladder is decompressed and not well assessed. The uterus is not well seen. No suspicious adnexal masses are identified. No inguinal lymphadenopathy is seen.  No acute osseous abnormalities are identified. Vacuum phenomenon is noted at L5-S1, with endplate sclerotic change.  IMPRESSION: 1. No acute abnormality seen within the abdomen or pelvis. 2. Right lower quadrant ileostomy is unremarkable in appearance. 3. Scattered calcification along the abdominal aorta and its branches.   Electronically Signed   By: [name] M.D.   On: 12/05/2014 05:43    Other results: EKG:  Sinus rhythm Right atrial enlargement Prolonged QT interval or possible u waves Non-specific abnormality, ST segment, and/or T-wave Confirmed by NANAVATI,  MD, Janey Genta [phone]) on  12/05/2014 12:15:35 AM  Assessment & Plan by Problem: Active Problems:   DEPRESSION, SITUATIONAL   Regional enteritis   Protein-calorie malnutrition   History of ventricular tachycardia   Chronic abdominal pain   Insomnia due to medical condition  Crohn's flare Pt admitted for a 2 week hx of abdominal pain, nausea, and vomiting. She has had several similar episodes in the past with the most recent being a visit to [facility] ED on 7/18. She confirmed not taking any medications for Crohn's. Currently only on Butran patch at home for pain mgmt. Last Gastroenterologist visit was > 6 months ago. She is a febrile but does have leukocytosis (WBC 16.9). Her lipase is elevated (55) and lactic acid elevated at 2.65. Pts symptoms most likely 2/2 Crohn's flare. Not likely gastroenteritis because symptoms are not acute in onset. Pt has a hx of abdominal surgeries in the past, Not likely SBO because CT abdomen with contrast was normal.  -Pt has not seen a gastroenterologist in the past 6 months, if her condition worsens, consider consulting GI -pain mgmt with Dilaudid and Morphine -Zofran and Phenergan prn nausea -NS at 100cc/hr -F/u repeat CBC -F/u repeat lactic acid -Wound care consult for ileostomy bag with stomal leak   AKI Likely 2/2 to dehydration. Pt has not been able to eat and drink much for the past 2 weeks. Cr is 2.11 today with a baseline <1. Hgb 18 indicating hemoconcentration. Also, patient received contrast for CT of abdomen.  --NS at 100cc/hr - clear liquids diet and Boost -F/u repeat BMP  -F/u CBC  Cachexia Most likely 2/2 Crohn's and pt not eating for the past few weeks. She had ketones in urine today.  -clear liquids diet + Boost as above. Advance diet as tolerated.  -F/u dietician recs   Prolonged QTc (578) Pt has a previous episode of previous episode of polymorphic Vtach.  -cont Metoprolol Succinate 25 mg QD  Depression -cont Celexa and Remeron    Insomnia -cont Ambien     Diet: clear liquids  DVT ppx: Lovenox  CODE: FULL   Dispo: Disposition is deferred at this time, awaiting improvement of current medical problems. Anticipated discharge in approximately 2-3 day(s).   The patient does have a current PCP Valentino Nose, MD) and does need an [facility] hospital follow-up appointment after discharge.  The patient does not have transportation limitations that hinder transportation to clinic appointments.  Signed: John Giovanni, MD 12/05/2014, 9:48 AM

## 2014-12-05 NOTE — ED Notes (Signed)
Report attempted 

## 2014-12-06 DIAGNOSIS — E860 Dehydration: Secondary | ICD-10-CM

## 2014-12-06 DIAGNOSIS — N179 Acute kidney failure, unspecified: Secondary | ICD-10-CM

## 2014-12-06 DIAGNOSIS — E46 Unspecified protein-calorie malnutrition: Secondary | ICD-10-CM

## 2014-12-06 DIAGNOSIS — K5090 Crohn's disease, unspecified, without complications: Secondary | ICD-10-CM | POA: Diagnosis not present

## 2014-12-06 DIAGNOSIS — Z9049 Acquired absence of other specified parts of digestive tract: Secondary | ICD-10-CM

## 2014-12-06 DIAGNOSIS — G4700 Insomnia, unspecified: Secondary | ICD-10-CM

## 2014-12-06 DIAGNOSIS — R64 Cachexia: Secondary | ICD-10-CM | POA: Diagnosis not present

## 2014-12-06 DIAGNOSIS — F329 Major depressive disorder, single episode, unspecified: Secondary | ICD-10-CM

## 2014-12-06 DIAGNOSIS — I4581 Long QT syndrome: Secondary | ICD-10-CM

## 2014-12-06 LAB — CBC
HEMATOCRIT: 43.4 % (ref 36.0–46.0)
Hemoglobin: 13.8 g/dL (ref 12.0–15.0)
MCH: 29 pg (ref 26.0–34.0)
MCHC: 31.8 g/dL (ref 30.0–36.0)
MCV: 91.2 fL (ref 78.0–100.0)
Platelets: 148 10*3/uL — ABNORMAL LOW (ref 150–400)
RBC: 4.76 MIL/uL (ref 3.87–5.11)
RDW: 13.5 % (ref 11.5–15.5)
WBC: 6.9 10*3/uL (ref 4.0–10.5)

## 2014-12-06 LAB — URINE CULTURE

## 2014-12-06 LAB — BASIC METABOLIC PANEL
Anion gap: 10 (ref 5–15)
BUN: 22 mg/dL — AB (ref 6–20)
CALCIUM: 8.2 mg/dL — AB (ref 8.9–10.3)
CHLORIDE: 103 mmol/L (ref 101–111)
CO2: 24 mmol/L (ref 22–32)
CREATININE: 0.87 mg/dL (ref 0.44–1.00)
GFR calc Af Amer: >60 mL/min (ref >60)
GFR calc non Af Amer: >60 mL/min (ref >60)
GLUCOSE: 72 mg/dL (ref 65–99)
Potassium: 3.2 mmol/L — ABNORMAL LOW (ref 3.5–5.1)
SODIUM: 137 mmol/L (ref 135–145)

## 2014-12-06 LAB — MAGNESIUM: Magnesium: 1.7 mg/dL (ref 1.7–2.4)

## 2014-12-06 MED ORDER — HYDROXYZINE HCL 50 MG/ML IM SOLN: 25.0000 mg | Freq: Once | INTRAMUSCULAR | Status: DC

## 2014-12-06 MED ORDER — MAGNESIUM SULFATE 2 GM/50ML IV SOLN
2.0000 g | Freq: Once | INTRAVENOUS | Status: AC
  Administered 2014-12-06: 2 g via INTRAVENOUS
  Filled 2014-12-06: qty 50

## 2014-12-06 MED ORDER — MORPHINE SULFATE 4 MG/ML IJ SOLN
3.0000 mg | INTRAMUSCULAR | Status: DC | PRN
  Administered 2014-12-06 – 2014-12-07 (×6): 3 mg via INTRAVENOUS
  Filled 2014-12-06 (×6): qty 1

## 2014-12-06 MED ORDER — HYDROXYZINE HCL 25 MG PO TABS
25.0000 mg | ORAL_TABLET | Freq: Once | ORAL | Status: AC
  Administered 2014-12-06: 25 mg via ORAL
  Filled 2014-12-06: qty 1

## 2014-12-06 MED ORDER — POTASSIUM CHLORIDE CRYS ER 20 MEQ PO TBCR
40.0000 meq | EXTENDED_RELEASE_TABLET | Freq: Once | ORAL | Status: AC
  Administered 2014-12-06: 40 meq via ORAL
  Filled 2014-12-06: qty 2

## 2014-12-06 NOTE — Progress Notes (Signed)
Pt tearful saying dilaudid has not helped and requesting I call the MD to get morphine ordered for her.  She stated she got morphine in the ED and it helped her better.  MD called and has ordered morphine.  Pt describes the pain as like a constant stinging or bee sting.  I asked the patient if this pain is new for her and she stated that she has had this pain for 10 years.  Will continue to monitor. Sherald Barge

## 2014-12-06 NOTE — Progress Notes (Signed)
Physical Therapy Treatment Patient Details Name: Misty [name] MRN: [id number] DOB: 07/09/1950 Today's Date: 12/06/2014    History of Present Illness Patient is a 64 yo female admitted 12/04/14 with abdominal pain, N/V, weight loss.  Patient with regional enteritis, Crohn's flare.  PMH:  Colectomy and ileostomy 1980's, depression, V-tach    PT Comments    Discharge planning will likely prove to be difficult; It is worth considering SNF for rehab to maximize independence and safety with mobility prior to dc home; also for long term we should consider an ALF type option -- however to hear pt talk about her animals, I'm not sure that she will agree to anything but home, and she is declining any sort of HHPT   Follow Up Recommendations  SNF;Supervision for mobility/OOB (ALF with HHPT; she declines HHPT in her home)     Equipment Recommendations  Rolling walker with 5" wheels    Recommendations for Other Services       Precautions / Restrictions Precautions Precautions: None Precaution Comments: Ileostomy    Mobility  Bed Mobility Overal bed mobility: Modified Independent                Transfers Overall transfer level: Needs assistance Equipment used: None Transfers: Sit to/from Stand Sit to Stand: Min guard         General transfer comment: Cues to self-monitor for activity tolerance  Ambulation/Gait Ambulation/Gait assistance: Min guard Ambulation Distance (Feet): 60 Feet Assistive device: None (pushing IV pole) Gait Pattern/deviations: Step-through pattern Gait velocity: Decreased Gait velocity interpretation: Below normal speed for age/gender General Gait Details: Largely unchanged from yesterday, still with considerable fatigue, and abdominal discomfort as well   Stairs            Wheelchair Mobility    Modified Rankin (Stroke Patients Only)       Balance             Standing balance-Leahy Scale: Fair                       Cognition Arousal/Alertness: Awake/alert Behavior During Therapy: WFL for tasks assessed/performed Overall Cognitive Status: Within Functional Limits for tasks assessed                      Exercises      General Comments        Pertinent Vitals/Pain Pain Assessment: 0-10 Pain Score: 7  Pain Location: Upper abdomen Pain Descriptors / Indicators: Aching Pain Intervention(s): Limited activity within patient's tolerance    Home Living                      Prior Function            PT Goals (current goals can now be found in the care plan section) Acute Rehab PT Goals Patient Stated Goal: To return home PT Goal Formulation: With patient Time For Goal Achievement: 12/12/14 Potential to Achieve Goals: Good Progress towards PT goals: Not progressing toward goals - comment (continued fatigue)    Frequency  Min 3X/week    PT Plan Current plan remains appropriate    Co-evaluation             End of Session   Activity Tolerance: Patient limited by fatigue;Patient limited by pain Patient left: in bed;with call bell/phone within reach     Time: 1239-1249 PT Time Calculation (min) (ACUTE ONLY): 10 min  Charges:  $Gait Training: 8-22  mins                    G Codes:      Olen Pel 12/06/2014, 2:42 PM  Van Clines, [place]  Acute Rehabilitation Services Pager [phone] Office [phone]

## 2014-12-06 NOTE — Progress Notes (Signed)
Subjective: Patient seen and examined at bedside today. States her abdominal pain is better. Denies fevers, chills, nausea, or vomiting.   Objective: Vital signs in last 24 hours: Filed Vitals:   12/06/14 0859 12/06/14 0905 12/06/14 0907 12/06/14 1312  BP: 101/53 115/75 99/68 107/58  Pulse: 58 77 78 61  Temp: 97.8 F (36.6 C)   98 F (36.7 C)  TempSrc: Oral   Oral  Resp: Height:      Weight:      SpO2: 96% 96% 95% 96%   Weight change: 9.6 oz (0.272 kg)  Intake/Output Summary (Last 24 hours) at 12/06/14 1320 Last data filed at 12/06/14 1312  Gross per 24 hour  Intake   2968 ml  Output    750 ml  Net   2218 ml   Physical Exam  Constitutional: She is oriented to person, place, and time. No distress.  Cachectic  HENT:  Head: Normocephalic and atraumatic.  Eyes: EOM are normal. Pupils are equal, round, and reactive to light.  Neck: Normal range of motion. Neck supple. No tracheal deviation present.  Cardiovascular: Normal rate, regular rhythm and normal heart sounds.  Pulmonary/Chest: No respiratory distress. She has no wheezes. She has no rales.  Abdominal: She exhibits no distension. There is tenderness. There is no rebound and no guarding.  Ileostomy bag in RLQ draining green bile like fluid  +BS  Musculoskeletal: Normal range of motion. She exhibits no edema.  Neurological: She is alert and oriented to person, place, and time. No cranial nerve deficit.  Skin: Skin is warm and dry. She is not diaphoretic.   Lab Results: Basic Metabolic Panel:  Recent Labs Lab 12/05/14 0021 12/05/14 0905 12/06/14 0500 12/06/14 0730  NA  --  136 137  --   K  --  3.2* 3.2*  --   CL  --  96* 103  --   CO2  --  26 24  --   GLUCOSE  --  93 72  --   BUN  --  32* 22*  --   CREATININE  --  1.21* 0.87  --   CALCIUM  --  8.9 8.2*  --   MG 2.2  --   --  1.7   Liver Function Tests:  Recent Labs Lab 12/01/14 1921 12/04/14 2122  AST 28 24  ALT 19 20  ALKPHOS  105 100  BILITOT 0.9 1.3*  PROT 7.9 9.0*  ALBUMIN 4.1 4.7    Recent Labs Lab 12/01/14 1921 12/04/14 2122  LIPASE 31 55*   CBC:  Recent Labs Lab 12/01/14 1921  12/05/14 0905 12/06/14 0500  WBC 11.8*  < > 12.7* 6.9  NEUTROABS 9.3*  --   --   --   HGB 15.5*  < > 16.4* 13.8  HCT 46.8*  < > 48.2* 43.4  MCV 88.5  < > 86.5 91.2  PLT 284  < > 260 148*  < > = values in this interval not displayed. Urinalysis:  Recent Labs Lab 12/05/14 0417  COLORURINE YELLOW  LABSPEC 1.017  PHURINE 5.0  GLUCOSEU NEGATIVE  HGBUR TRACE*  BILIRUBINUR MODERATE*  KETONESUR 15*  PROTEINUR 30*  UROBILINOGEN 0.2  NITRITE NEGATIVE  LEUKOCYTESUR SMALL*    Micro Results: Recent Results (from the past 240 hour(s))  Urine culture     Status: None   Collection Time: 12/05/14  4:18 AM  Result Value Ref Range Status   Specimen Description URINE, CLEAN CATCH  Final   Special Requests NONE  Final   Culture MULTIPLE SPECIES PRESENT, SUGGEST RECOLLECTION  Final   Report Status 12/06/2014 FINAL  Final   Studies/Results: Ct Abdomen Pelvis Wo Contrast  12/05/2014   CLINICAL DATA:  Subacute onset of generalized abdominal pain, nausea, vomiting and diarrhea. Has lost 30 lbs in the past month. Initial encounter.  EXAM: CT ABDOMEN AND PELVIS WITHOUT CONTRAST  TECHNIQUE: Multidetector CT imaging of the abdomen and pelvis was performed following the standard protocol without IV contrast.  COMPARISON:  CT of the abdomen and pelvis from 06/18/2010  FINDINGS: The visualized lung bases are clear.  The liver and spleen are unremarkable in appearance. The gallbladder is within normal limits. The pancreas and adrenal glands are unremarkable.  A right-sided pelvic kidney is noted. The kidneys are grossly unremarkable in appearance. There is no evidence of hydronephrosis. No renal or ureteral stones are seen. No perinephric stranding is appreciated.  No free fluid is identified. The small bowel is unremarkable in  appearance. The stomach is within normal limits. No acute vascular abnormalities are seen. Scattered calcification is noted along the abdominal aorta and its branches.  The patient is status post colectomy. A right lower quadrant ileostomy is grossly unremarkable in appearance.  The bladder is decompressed and not well assessed. The uterus is not well seen. No suspicious adnexal masses are identified. No inguinal lymphadenopathy is seen.  No acute osseous abnormalities are identified. Vacuum phenomenon is noted at L5-S1, with endplate sclerotic change.  IMPRESSION: 1. No acute abnormality seen within the abdomen or pelvis. 2. Right lower quadrant ileostomy is unremarkable in appearance. 3. Scattered calcification along the abdominal aorta and its branches.   Electronically Signed   By: [name] M.D.   On: 12/05/2014 05:43   Medications: I have reviewed the patient's current medications. Scheduled Meds: . sodium chloride  10 mL/hr Intravenous Once  . citalopram  10 mg Oral Daily  . enoxaparin (LOVENOX) injection  30 mg Subcutaneous Q24H  . feeding supplement  1 Container Oral TID BM  . metoprolol succinate  25 mg Oral Q breakfast  . mirtazapine  30 mg Oral QHS   Continuous Infusions:  PRN Meds:.HYDROmorphone (DILAUDID) injection, promethazine, zolpidem Assessment/Plan: Active Problems:   DEPRESSION, SITUATIONAL   Regional enteritis   Protein-calorie malnutrition   History of ventricular tachycardia   Chronic abdominal pain   Insomnia due to medical condition   Protein-calorie malnutrition, severe  Crohn's flare Pt admitted for a 2 week hx of abdominal pain, nausea, and vomiting. She is a febrile, leukocytosis resolved (WBC 6.9). Pt states her abdominal pain is better today.  -cont pain mgmt with Dilaudid  -cont Zofran and Phenergan prn nausea -NS at 100cc/hr -pt needs to follow up with GI as outpatient   Dehydration Resolved. Cr 0.87 today.   -D/c NS infusion. -clear liquids  diet and Boost -F/u BMP   Cachexia Most likely 2/2 Crohn's and pt not eating for the past few weeks. She had ketones in urine. PT tried to walk pt and she is still weak.  -clear liquids diet + Boost as above. Advance diet as tolerated.  -F/u dietician recs   Hypokalemia K 3.2 this morning. Repleted with oral KCL 40 meq x 2. -f/u BMP  Prolonged QTc (578) Pt has a previous episode of previous episode of polymorphic Vtach.  -cont Metoprolol Succinate 25 mg QD  Depression -cont Celexa and Remeron   Insomnia -cont Ambien   Diet: clear liquids  DVT ppx: Lovenox  CODE: FULL   Dispo: Disposition is deferred at this time, awaiting improvement of current medical problems.  Anticipated discharge in approximately 1-2 days.  The patient does have a current PCP Valentino Nose, MD) and does need an [facility] hospital follow-up appointment after discharge.  The patient does not have transportation limitations that hinder transportation to clinic appointments.  .Services Needed at time of discharge: Y = Yes, Blank = No PT:   OT:   RN:   Equipment:   Other:       John Giovanni, MD 12/06/2014, 1:20 PM

## 2014-12-06 NOTE — Progress Notes (Signed)
Heard patient crying alone. Went into room. She had eaten ice cream and a cheese sandwich, which she says made her stoma edges hurt like pins and needles being stuck in her. She states that it sometimes itches so badly around the stoma that she has shredded some of her clothes from the itching. She says it worsens in the late afternoon and she has noticed that some foods make it worse. She is willing to try anything if it would help. She stated that she would be better off dead. I noticed how red and excoriated she is on her abdomen where her ileostomy bag had leaked. She admitted to anticipating the pain before she eats which impeded her appetite.

## 2014-12-06 NOTE — Progress Notes (Signed)
Pt tearful, asking for pain medication.  I told her that her pain medicine would be due at 9:15p and pt is ok with this.  She states morphine has worked better for her in the past.  I told her the pain medicine ordered is dilaudid and this is what she has been getting all day today.  She is ok with this and agrees to take dilaudid when due. Sherald Barge

## 2014-12-06 NOTE — Plan of Care (Signed)
Problem: Phase I Progression Outcomes Goal: Pain controlled with appropriate interventions Outcome: Not Progressing Patient asked to see if we could get her Morphine 8-10mg  IV ordered for pain. Mentioned that she is a pain clinic patient. Oncoming nurse will decide whether to call or not.

## 2014-12-07 DIAGNOSIS — E875 Hyperkalemia: Secondary | ICD-10-CM

## 2014-12-07 DIAGNOSIS — R64 Cachexia: Secondary | ICD-10-CM | POA: Diagnosis not present

## 2014-12-07 DIAGNOSIS — K5090 Crohn's disease, unspecified, without complications: Secondary | ICD-10-CM | POA: Diagnosis not present

## 2014-12-07 LAB — BASIC METABOLIC PANEL
Anion gap: 6 (ref 5–15)
BUN: 10 mg/dL (ref 6–20)
CALCIUM: 8.5 mg/dL — AB (ref 8.9–10.3)
CHLORIDE: 102 mmol/L (ref 101–111)
CO2: 27 mmol/L (ref 22–32)
Creatinine, Ser: 0.84 mg/dL (ref 0.44–1.00)
GFR calc Af Amer: >60 mL/min (ref >60)
GLUCOSE: 90 mg/dL (ref 65–99)
Potassium: 3.1 mmol/L — ABNORMAL LOW (ref 3.5–5.1)
SODIUM: 135 mmol/L (ref 135–145)

## 2014-12-07 LAB — MAGNESIUM: MAGNESIUM: 1.8 mg/dL (ref 1.7–2.4)

## 2014-12-07 MED ORDER — CITALOPRAM HYDROBROMIDE 20 MG PO TABS: 20.0000 mg | ORAL_TABLET | Freq: Every day | ORAL | Status: DC

## 2014-12-07 MED ORDER — GABAPENTIN 400 MG PO CAPS
800.0000 mg | ORAL_CAPSULE | Freq: Three times a day (TID) | ORAL | Status: DC
  Administered 2014-12-07 (×2): 800 mg via ORAL
  Filled 2014-12-07 (×2): qty 2

## 2014-12-07 MED ORDER — CITALOPRAM HYDROBROMIDE 20 MG PO TABS
20.0000 mg | ORAL_TABLET | Freq: Every day | ORAL | Status: DC
  Administered 2014-12-07: 20 mg via ORAL
  Filled 2014-12-07: qty 1

## 2014-12-07 MED ORDER — MAGNESIUM SULFATE 2 GM/50ML IV SOLN
2.0000 g | Freq: Once | INTRAVENOUS | Status: AC
  Administered 2014-12-07: 2 g via INTRAVENOUS
  Filled 2014-12-07 (×2): qty 50

## 2014-12-07 MED ORDER — GABAPENTIN 800 MG PO TABS: 800.0000 mg | ORAL_TABLET | Freq: Three times a day (TID) | ORAL | Status: AC

## 2014-12-07 MED ORDER — GABAPENTIN 800 MG PO TABS
800.0000 mg | ORAL_TABLET | Freq: Three times a day (TID) | ORAL | Status: DC
  Filled 2014-12-07 (×3): qty 1

## 2014-12-07 MED ORDER — POTASSIUM CHLORIDE CRYS ER 20 MEQ PO TBCR
40.0000 meq | EXTENDED_RELEASE_TABLET | Freq: Three times a day (TID) | ORAL | Status: AC
  Administered 2014-12-07 (×3): 40 meq via ORAL
  Filled 2014-12-07 (×3): qty 2

## 2014-12-07 NOTE — Discharge Instructions (Signed)
Please go to follow up appointment:  Dr. Andrey Campanile on 12/10/14 at 10:15 am  [address] [place] [address] [phone]  Medications:  Please take Celexa 20 mg 1 tablet by mouth everyday.  Please take Neurontin 800 mg 1 tablet by mouth three times day.   Crohn Disease Crohn disease is a long-term (chronic) soreness and redness (inflammation) of the intestines (bowel). It can affect any portion of the digestive tract, from the mouth to the anus. It can also cause problems outside the digestive tract. Crohn disease is closely related to a disease called ulcerative colitis (together, these two diseases are called inflammatory bowel disease).  CAUSES  The cause of Crohn disease is not known. One Nelva Bush is that, in an easily affected person, the immune system is triggered to attack the body's own digestive tissue. Crohn disease runs in families. It seems to be more common in certain geographic areas and amongst certain races. There are no clear-cut dietary causes.  SYMPTOMS  Crohn disease can cause many different symptoms since it can affect many different parts of the body. Symptoms include:  Fatigue.  Weight loss.  Chronic diarrhea, sometime bloody.  Abdominal pain and cramps.  Fever.  Ulcers or canker sores in the mouth or rectum.  Anemia (low red blood cells).  Arthritis, skin problems, and eye problems may occur. Complications of Crohn disease can include:  Series of holes (perforation) of the bowel.  Portions of the intestines sticking to each other (adhesions).  Obstruction of the bowel.  Fistula formation, typically in the rectal area but also in other areas. A fistula is an opening between the bowels and the outside, or between the bowels and another organ.  A painful crack in the mucous membrane of the anus (rectal fissure). DIAGNOSIS  Your caregiver may suspect Crohn disease based on your symptoms and an exam. Blood tests may confirm that there is a problem. You  may be asked to submit a stool specimen for examination. X-rays and CT scans may be necessary. Ultimately, the diagnosis is usually made after a procedure that uses a flexible tube that is inserted via your mouth or your anus. This is done under sedation and is called either an upper endoscopy or colonoscopy. With these tests, the specialist can take tiny tissue samples and remove them from the inside of the bowel (biopsy). Examination of this biopsy tissue under a microscope can reveal Crohn disease as the cause of your symptoms. Due to the many different forms that Crohn disease can take, symptoms may be present for several years before a diagnosis is made. TREATMENT  Medications are often used to decrease inflammation and control the immune system. These include medicines related to aspirin, steroid medications, and newer and stronger medications to slow down the immune system. Some medications may be used as suppositories or enemas. A number of other medications are used or have been studied. Your caregiver will make specific recommendations. HOME CARE INSTRUCTIONS   Symptoms such as diarrhea can be controlled with medications. Avoid foods that have a laxative effect such as fresh fruit, vegetables, and dairy products. During flare-ups, you can rest your bowel by refraining from solid foods. Drink clear liquids frequently during the day. (Electrolyte or rehydrating fluids are best. Your caregiver can help you with suggestions.) Drink often to prevent loss of body fluids (dehydration). When diarrhea has cleared, eat small meals and more frequently. Avoid food additives and stimulants such as caffeine (coffee, tea, or chocolate). Enzyme supplements may  help if you develop intolerance to a sugar in dairy products (lactose). Ask your caregiver or dietitian about specific dietary instructions.  Try to maintain a positive attitude. Learn relaxation techniques such as self-hypnosis, mental imaging, and muscle  relaxation.  If possible, avoid stresses which can aggravate your condition.  Exercise regularly.  Follow your diet.  Always get plenty of rest. SEEK MEDICAL CARE IF:   Your symptoms fail to improve after a week or two of new treatment.  You experience continued weight loss.  You have ongoing cramps or loose bowels.  You develop a new skin rash, skin sores, or eye problems. SEEK IMMEDIATE MEDICAL CARE IF:   You have worsening of your symptoms or develop new symptoms.  You have a fever.  You develop bloody diarrhea.  You develop severe abdominal pain. MAKE SURE YOU:   Understand these instructions.  Will watch your condition.  Will get help right away if you are not doing well or get worse. Document Released: 02/08/2005 Document Revised: 09/15/2013 Document Reviewed: 01/07/2007 [facility] Patient Information 2015 [place], [place]. This information is not intended to replace advice given to you by your health care provider. Make sure you discuss any questions you have with your health care provider.

## 2014-12-07 NOTE — Care Management Note (Signed)
Case Management Note  Patient Details  Name: Misty [name] MRN: [id number] Date of Birth: 05-28-1950  Subjective/Objective:                    Action/Plan: Spoke with patient regarding home health , patient declined at present , states she does not feel like she needs home health . Patient stated she has 30 pets , and the animals are not use to people coming to her home.   Explained PT recommended walker . Patient declined walker also and stated she helps safe ambulating on her own without walker.   Expected Discharge Date:  12/07/14               Expected Discharge Plan:     In-House Referral:     Discharge planning Services  CM Consult  Post Acute Care Choice:  Durable Medical Equipment Choice offered to:     DME Arranged:    DME Agency:     HH Arranged:    HH Agency:     Status of Service:  Completed, signed off  Medicare Important Message Given:    Date Medicare IM Given:    Medicare IM give by:    Date Additional Medicare IM Given:    Additional Medicare Important Message give by:     If discussed at Long Length of Stay Meetings, dates discussed:    Additional Comments:  Kingsley Plan, RN 12/07/2014, 2:15 PM

## 2014-12-07 NOTE — Progress Notes (Signed)
Patient seen and examined. Case d/w residents in detail. I agree with findings and plan as documented in Dr. Gardiner Rhyme note.  Patient feels better today.Still with mild pain at stomal site but tolerating PO. Only mild tenderness near stomal site. C/w pain control prn per outpatient pain clinic. Would increase Celexa to 20 mg for depression. Patient is stable for d/c home today.

## 2014-12-07 NOTE — Progress Notes (Addendum)
Subjective: Pt seen and examined at bedside today. She is still complaining of some pain around the site of the stoma. States the pain is not inside her abdomen but instead in the skin and feels like a "wasp sting." States this pain is not new, she has always had it. According to pt, no medication has fully alleviated her pain in the past and as such she follows up with a pain clinic. As per nursing note, patient was crying yesterday. She has a history of depression and currently on Celexa 10 mg at home. Patient denies having any suicidal ideation. Denies CP, SOB, nausea, vomiting, or diarrhea. States she is trying to eat her meals.   Of note: PT saw patient and recommended SNF or HHPT. Social work and case management also saw the patient. She has declined both SNF and HHPT because she has 30 pets and the animals are not used to people coming to her home. As per case management, patient declined a walker and states she feels safe ambulating on her own.   Objective: Vital signs in last 24 hours: Filed Vitals:   12/06/14 0907 12/06/14 1312 12/06/14 2259 12/07/14 0658  BP: 99/68 107/58 107/60 101/57  Pulse: 78 61 63 66  Temp:  98 F (36.7 C) 98.9 F (37.2 C) 99.2 F (37.3 C)  TempSrc:  Oral Oral Oral  Resp: 18 16 20 18   Height:      Weight:    87 lb 14.4 oz (39.871 kg)  SpO2: 95% 96% 97% 98%   Weight change: 7 lb 1.6 oz (3.221 kg)  Intake/Output Summary (Last 24 hours) at 12/07/14 1518 Last data filed at 12/07/14 1425  Gross per 24 hour  Intake    700 ml  Output    600 ml  Net    100 ml   Physical Exam  Constitutional: She is oriented to person, place, and time. No distress.  Cachectic  HENT:  Head: Normocephalic and atraumatic.  Eyes: EOM are normal. Pupils are equal, round, and reactive to light.  Neck: Normal range of motion. Neck supple. No tracheal deviation present.  Cardiovascular: Normal rate, regular rhythm and normal heart sounds.  Pulmonary/Chest: No  respiratory distress. She has no wheezes. She has no rales.  Abdominal: She exhibits no distension. There is tenderness. There is no rebound and no guarding.  Ileostomy bag in RLQ contains stool +BS  Musculoskeletal: Normal range of motion. She exhibits no edema.  Neurological: She is alert and oriented to person, place, and time. No cranial nerve deficit.  Skin: Skin is warm and dry. She is not diaphoretic.   Lab Results: Basic Metabolic Panel:  Recent Labs Lab 12/06/14 0500 12/06/14 0730 12/07/14 0513  NA 137  --  135  K 3.2*  --  3.1*  CL 103  --  102  CO2 24  --  27  GLUCOSE 72  --  90  BUN 22*  --  10  CREATININE 0.87  --  0.84  CALCIUM 8.2*  --  8.5*  MG  --  1.7 1.8   Liver Function Tests:  Recent Labs Lab 12/01/14 1921 12/04/14 2122  AST 28 24  ALT 19 20  ALKPHOS 105 100  BILITOT 0.9 1.3*  PROT 7.9 9.0*  ALBUMIN 4.1 4.7    Recent Labs Lab 12/01/14 1921 12/04/14 2122  LIPASE 31 55*   CBC:  Recent Labs Lab 12/01/14 1921  12/05/14 0905 12/06/14 0500  WBC 11.8*  < >  12.7* 6.9  NEUTROABS 9.3*  --   --   --   HGB 15.5*  < > 16.4* 13.8  HCT 46.8*  < > 48.2* 43.4  MCV 88.5  < > 86.5 91.2  PLT 284  < > 260 148*  < > = values in this interval not displayed.  Urinalysis:  Recent Labs Lab 12/05/14 0417  COLORURINE YELLOW  LABSPEC 1.017  PHURINE 5.0  GLUCOSEU NEGATIVE  HGBUR TRACE*  BILIRUBINUR MODERATE*  KETONESUR 15*  PROTEINUR 30*  UROBILINOGEN 0.2  NITRITE NEGATIVE  LEUKOCYTESUR SMALL*    Micro Results: Recent Results (from the past 240 hour(s))  Urine culture     Status: None   Collection Time: 12/05/14  4:18 AM  Result Value Ref Range Status   Specimen Description URINE, CLEAN CATCH  Final   Special Requests NONE  Final   Culture MULTIPLE SPECIES PRESENT, SUGGEST RECOLLECTION  Final   Report Status 12/06/2014 FINAL  Final   Studies/Results: No results found. Medications: I have reviewed the patient's current  medications. Scheduled Meds: . sodium chloride  10 mL/hr Intravenous Once  . citalopram  20 mg Oral Daily  . enoxaparin (LOVENOX) injection  30 mg Subcutaneous Q24H  . feeding supplement  1 Container Oral TID BM  . gabapentin  800 mg Oral TID  . metoprolol succinate  25 mg Oral Q breakfast  . mirtazapine  30 mg Oral QHS  . potassium chloride  40 mEq Oral TID   Continuous Infusions:  PRN Meds:.morphine injection, promethazine, zolpidem Assessment/Plan: Active Problems:   DEPRESSION, SITUATIONAL   Regional enteritis   Protein-calorie malnutrition   History of ventricular tachycardia   Chronic abdominal pain   Insomnia due to medical condition   Protein-calorie malnutrition, severe   Crohn's disease, acute   Dehydration  Pt is a 64 yo F with a PMHx of Crohn's disease s/p colectomy and right lower quadrant ileostomy in the 1980s presenting with abdominal pain, nausea and vomiting.   Acute exacerbation of Crohn's colitis Pt admitted for a 2 week hx of abdominal pain, nausea, and vomiting. She is a febrile, leukocytosis resolved. Pt tolerating diet well, ileostomy bag contains stool. Denies nausea or vomiting. States her pain is superficial, in the skin at the site of stoma opening. Last night patient stated Dilaudid was not helping her and she preferred Morphine for pain mgmt. As such, Dilaudid was discontinued and Morphine started. Pt states this pain is a chronic issue for her and no medication has fully alleviated it in the past. She follows up with a pain clinic.  -cont pain mgmt with Morphine prn -Neurontin 800 mg TID  -pt tolerating diet well. There was stool in the ileostomy bag. Possible discharge today. -pt to f/u with PCP on 7/28 and will need an outpatient GI referral    Dehydration Resolved. Cr 0.84 today.Tolerating diet well and drinking Boost supplement   Cachexia Most likely 2/2 Crohn's and pt not eating for the past few weeks. She had ketones in urine upon  admission.  -Advance diet as tolerated.  -Continue Boost supplementation  -Appreciate dietician recs   Hypokalemia K 3.1 this morning. Repleted with oral KCL 40 meq x 3. Mag was 1.8 and repleted with IV Magnesium sulfate 2g.    Electrolytes expected to improve as patient's appetite increases. It was noted that patient's home meds contained Lasix. She was advised to discontinue taking this medication.   Prolonged QTc (578) Pt has a previous episode of previous episode  of polymorphic Vtach.  -cont Metoprolol Succinate 25 mg QD  Depression As per nursing note, patient was crying yesterday. She has a history of depression and currently on Celexa 10 mg at home. Patient denies having any suicidal ideation. -Increased dose of Celexa to 20mg  QD. Patient aware.  -She is to f/u with PCP on 7/28.   Insomnia -cont Ambien   Diet: clear liquids  DVT ppx: Lovenox  CODE: FULL  Dispo: Disposition is deferred at this time, awaiting improvement of current medical problems.  Anticipated discharge in approximately 0 day(s).   The patient does have a current PCP Valentino Nose, MD) and does need an [facility] hospital follow-up appointment after discharge.  The patient does not have transportation limitations that hinder transportation to clinic appointments.  .Services Needed at time of discharge: Y = Yes, Blank = No PT:   OT:   RN:   Equipment:   Other:       John Giovanni, MD 12/07/2014, 3:18 PM

## 2014-12-07 NOTE — Consult Note (Signed)
WOC ostomy consult note Stoma type/location: RLQ, ileostomy After discussion with the patient her leakage was from the fact that she did not have money to purchase pouches and she was "making do" with what she had in the home. She is independent with her ostomy care and has had her ileostomy since the age of 34.  I have informed her that Medicare should cover her ostomy supplies and I have provided her with 3 pouches and wafers and 3 barrier rings. I have also provided her with an [employer] catelog that accepts Medicare assignment to assist her with finding the products she routinely uses.  Stomal assessment/size: did not measure, pouch intact  Peristomal assessment: did not assess today, pouch intact and patient reports skin irritated from her leaking pouch Treatment options for stomal/peristomal skin: will provide the patient with ostomy powder and instruct her on the crusting technique for the denuded skin  Output brown, liquid Ostomy pouching: 2pc. 4" wafer and pouch placed by staff, probably because that's all that they had on the floor.  Education provided:  See above   WOC will remain available to patient while inpatient Please reconsult if needed Kewanna Kasprzak Marlena Clipper, [employer] [phone]

## 2014-12-07 NOTE — Clinical Social Work Note (Signed)
CSW received referral for SNF.  Case discussed with patient and case manager, patient does not want to go to a SNF.  Patient is willing to speak to case manager to discuss home health, CSW notified case manager.  CSW to sign off please re-consult if social work needs arise.  Misty [name]. Calissa Swenor, MSW, [employer] [phone]

## 2014-12-07 NOTE — Discharge Summary (Signed)
Name: Misty [name] MRN: [id number] DOB: 08/13/1950 64 y.o. PCP: Valentino Nose, MD  Date of Admission: 12/04/2014 11:36 PM Date of Discharge: 12/07/2014 Attending Physician: Levert Feinstein, MD  Discharge Diagnosis: 1.  Active Problems:   DEPRESSION, SITUATIONAL   Regional enteritis   Protein-calorie malnutrition   History of ventricular tachycardia   Chronic abdominal pain   Insomnia due to medical condition   Protein-calorie malnutrition, severe   Crohn's disease, acute   Dehydration  Discharge Medications:   Medication List    STOP taking these medications        furosemide 20 MG tablet  Commonly known as:  LASIX      TAKE these medications        alendronate 10 MG tablet  Commonly known as:  FOSAMAX  TAKE 1 TABLET BY MOUTH DAILY BEFORE BREAKFAST. TAKE WITH FULL GLASS OF WATER ON AN EMPTY STOMACH     aspirin 325 MG tablet  Take 325 mg by mouth daily.     citalopram 20 MG tablet  Commonly known as:  CELEXA  Take 1 tablet (20 mg total) by mouth daily.     gabapentin 800 MG tablet  Commonly known as:  NEURONTIN  Take 1 tablet (800 mg total) by mouth 3 (three) times daily.     metoprolol succinate 25 MG 24 hr tablet  Commonly known as:  TOPROL-XL  Take 1 tablet (25 mg total) by mouth daily.     mirtazapine 30 MG tablet  Commonly known as:  REMERON  Take 30 mg by mouth at bedtime.     NUCYNTA 100 MG Tabs  Generic drug:  Tapentadol HCl  Take 100 mg by mouth 5 (five) times daily.     tiZANidine 4 MG tablet  Commonly known as:  ZANAFLEX  Take 4 mg by mouth 4 (four) times daily.     zolpidem 10 MG tablet  Commonly known as:  AMBIEN  Take 1 tablet (10 mg total) by mouth at bedtime as needed. for sleep        Disposition and follow-up:   Ms.Misty [name] was discharged from [facility] in Stable condition.  At the hospital follow up visit please address:  1.  Acute exacerbation of Crohn's colitis Sill having abdominal  pain? Any fever or chills? Any blood in stool? Pt needs outpatient GI referral.   Of note: PT saw patient and recommended SNF or HHPT. Social work and case management also saw the patient. She had declined both SNF and HHPT because she has 30 pets and the animals are not used to people coming to her home. As per case management, patient declined a walker and stated she feel safe ambulating on her own.  Cachexia How is her appetite?  Hypokalemia Pt had low K and Mag levels 2/2 poor oral intake and electrolytes were repleted. Please f/u BMP.   2.  Labs / imaging needed at time of follow-up: BMP  3.  Pending labs/ test needing follow-up: None  Follow-up Appointments:     Follow-up Information    Follow up with Misty Peat, DO. Go on 12/10/2014.   Specialty:  Internal Medicine   Why:  Appointment at 10:15 am   Contact information:   [address] [place] [address] [phone]       Discharge Instructions:   Consultations:    Procedures Performed:  Ct Abdomen Pelvis Wo Contrast  12/05/2014   CLINICAL DATA:  Subacute onset of  generalized abdominal pain, nausea, vomiting and diarrhea. Has lost 30 lbs in the past month. Initial encounter.  EXAM: CT ABDOMEN AND PELVIS WITHOUT CONTRAST  TECHNIQUE: Multidetector CT imaging of the abdomen and pelvis was performed following the standard protocol without IV contrast.  COMPARISON:  CT of the abdomen and pelvis from 06/18/2010  FINDINGS: The visualized lung bases are clear.  The liver and spleen are unremarkable in appearance. The gallbladder is within normal limits. The pancreas and adrenal glands are unremarkable.  A right-sided pelvic kidney is noted. The kidneys are grossly unremarkable in appearance. There is no evidence of hydronephrosis. No renal or ureteral stones are seen. No perinephric stranding is appreciated.  No free fluid is identified. The small bowel is unremarkable in appearance. The stomach is within normal limits. No acute  vascular abnormalities are seen. Scattered calcification is noted along the abdominal aorta and its branches.  The patient is status post colectomy. A right lower quadrant ileostomy is grossly unremarkable in appearance.  The bladder is decompressed and not well assessed. The uterus is not well seen. No suspicious adnexal masses are identified. No inguinal lymphadenopathy is seen.  No acute osseous abnormalities are identified. Vacuum phenomenon is noted at L5-S1, with endplate sclerotic change.  IMPRESSION: 1. No acute abnormality seen within the abdomen or pelvis. 2. Right lower quadrant ileostomy is unremarkable in appearance. 3. Scattered calcification along the abdominal aorta and its branches.   Electronically Signed   By: [name] M.D.   On: 12/05/2014 05:43    Admission HPI: Pt is a 64 yo F with a PMHx of Crohn's disease s/p colectomy and right lower quadrant ileostomy in the 1980s presenting with 10/10 abdominal pain, nausea and vomiting. States her symptoms started about 2 weeks ago and she believes this is another flare up of her Crohn's disease. According to pt, she has had several Crohn's flare ups in the past and the synptoms were similar. States her abdominal pain is throbbing in nature and present bilaterally in the upper region of the abdomen. States she feels weak, tired, lightheaded since she has been vomiting every few hours at home. Denies any hematemesis. Pt has a colostomy bag in place and denies noticing any blood in it. States she has not been passing stool in the bag. Pt also reports loosing 30 lbs of weight in the past 3-4 weeks. States she has not followed up with her Gastroenterologist in the past 6 months. She currently uses Butran patches prescribed to her by the pain clinic but has never been on any medication for her Crohn's. Denies HA, fever, chills, CP, SOB, or dysuria. No other complaints.   Pt was last seen at [facility] ED on 7/18 for a flare up of her Crohns. In the  ED today she was given 1 L bolus of fluid, Dilaudid, Morphine, and Zofran.   Hospital Course by problem list: Active Problems:   DEPRESSION, SITUATIONAL   Regional enteritis   Protein-calorie malnutrition   History of ventricular tachycardia   Chronic abdominal pain   Insomnia due to medical condition   Protein-calorie malnutrition, severe   Crohn's disease, acute   Dehydration   1. Acute exacerbation of Crohn's colitis Pt was admitted for a 2 week hx of abdominal pain, nausea, and vomiting. She has had several similar episodes in the past with the most recent being a visit to [facility] ED on 7/18. She confirmed not taking any medications for Crohn's. Currently only on Butran patch at home  for pain mgmt. Last Gastroenterologist visit was > 6 months ago. Upon admission, she was afebrile but did have leukocytosis (WBC 16.9). Her lipase was elevated (55) and lactic acid elevated at 2.65. She was given Dilaudid and Morphine prn pain mgmt and Zofran and Phenergan prn nausea. Started on NS at 100cc/hr. Her ileostomy bag had a stomal leak,and as such, wound care was consulted. On 7/24, she continued to be afebrile, leukocytosis resolved, and aas per patient her abdominal pain improved. On 7/15, she was tolerating diet well, ileostomy bag contained stool. Denied nausea or vomiting. Pt stated her pain was superficial, in the skin at the site of stoma opening. Stated this pain was a chronic issue for her and no medication has fully alleviated it in the past. She follows up with a pain clinic. Pt discharged with Neurontin 800 mg TID and is to follow up with PCP Dr. Andrey Campanile on 7/28.  Dehydration Pt has not been able to eat and drink much for 2 weeks prior to date of admisssion. Cr was 2.11 with a baseline <1. Hgb 18 indicating hemoconcentration. Also, patient received contrast for CT of abdomen in the emergency department. We continued NS at 100cc/hr and started her on a clear liquids diet with Boost  supplementation. SCr 0.84 on day of discharge.   Cachexia Most likely 2/2 Crohn's and pt not eating for the past few weeks. She had ketones in urine upon admission. She was started on a clear liquids diet with Boost supplementation. Diet advanced as tolerated. Dietician on board.    Hypokalemia K 3.2 on 7/24. Repleted with oral KCL 40 meq x 2. K 3.1 the following day. Repleted again with oral KCL 40 meq x 3. Mag was 1.8 and repleted with IV Magnesium sulfate 2g. Electrolytes expected to improve as patient's appetite increases. It was noted that patient's home meds contained Lasix. She was advised to discontinue taking this medication. Pt to f/u with PCP on 7/28.   Prolonged QTc (578) Pt has a previous episode of previous episode of polymorphic Vtach.  -cont Metoprolol Succinate 25 mg QD  Depression As per nursing note, patient was crying on 7/24. She has a history of depression and currently on Celexa 10 mg at home. Patient denied having any suicidal ideation. We      increased dose of Celexa to 20mg  QD. Patient aware. She is to f/u with PCP on 7/28.   Insomnia -cont Ambien    Discharge Vitals:   BP 101/57 mmHg  Pulse 66  Temp(Src) 99.2 F (37.3 C) (Oral)  Resp 18  Ht 5\' 5"  (1.651 m)  Wt 87 lb 14.4 oz (39.871 kg)  BMI 14.63 kg/m2  SpO2 98%  LMP 01/26/1990  Discharge Labs:  Results for orders placed or performed during the hospital encounter of 12/04/14 (from the past 24 hour(s))  Basic metabolic panel     Status: Abnormal   Collection Time: 12/07/14  5:13 AM  Result Value Ref Range   Sodium 135 135 - 145 mmol/L   Potassium 3.1 (L) 3.5 - 5.1 mmol/L   Chloride 102 101 - 111 mmol/L   CO2 27 22 - 32 mmol/L   Glucose, Bld 90 65 - 99 mg/dL   BUN 10 6 - 20 mg/dL   Creatinine, Ser 1.61 0.44 - 1.00 mg/dL   Calcium 8.5 (L) 8.9 - 10.3 mg/dL   GFR calc non Af Amer >60 >60 mL/min   GFR calc Af Amer >60 >60 mL/min   Anion gap  6 5 - 15  Magnesium     Status: None   Collection  Time: 12/07/14  5:13 AM  Result Value Ref Range   Magnesium 1.8 1.7 - 2.4 mg/dL    Signed: John Giovanni, MD 12/07/2014, 11:54 AM    Services Ordered on Discharge: None Equipment Ordered on Discharge: None

## 2014-12-07 NOTE — Progress Notes (Signed)
Discussed discharge summary with patient. Reviewed all medications with patient. Patient ready for discharge.  

## 2014-12-08 ENCOUNTER — Emergency Department (HOSPITAL_COMMUNITY)
Admission: EM | Admit: 2014-12-08 | Discharge: 2014-12-09 | Disposition: A | Discharge status: Home / Self Care | Payer: Medicare Other | Attending: Emergency Medicine | Admitting: Emergency Medicine

## 2014-12-08 ENCOUNTER — Encounter (HOSPITAL_COMMUNITY): Payer: Self-pay | Admitting: Emergency Medicine

## 2014-12-08 DIAGNOSIS — F329 Major depressive disorder, single episode, unspecified: Secondary | ICD-10-CM | POA: Insufficient documentation

## 2014-12-08 DIAGNOSIS — Z79899 Other long term (current) drug therapy: Secondary | ICD-10-CM | POA: Insufficient documentation

## 2014-12-08 DIAGNOSIS — Z8719 Personal history of other diseases of the digestive system: Secondary | ICD-10-CM | POA: Diagnosis not present

## 2014-12-08 DIAGNOSIS — R112 Nausea with vomiting, unspecified: Secondary | ICD-10-CM | POA: Insufficient documentation

## 2014-12-08 DIAGNOSIS — G4701 Insomnia due to medical condition: Secondary | ICD-10-CM | POA: Diagnosis not present

## 2014-12-08 DIAGNOSIS — R1084 Generalized abdominal pain: Secondary | ICD-10-CM | POA: Insufficient documentation

## 2014-12-08 DIAGNOSIS — Z9851 Tubal ligation status: Secondary | ICD-10-CM | POA: Insufficient documentation

## 2014-12-08 DIAGNOSIS — G8929 Other chronic pain: Secondary | ICD-10-CM

## 2014-12-08 DIAGNOSIS — Z8679 Personal history of other diseases of the circulatory system: Secondary | ICD-10-CM | POA: Insufficient documentation

## 2014-12-08 DIAGNOSIS — Z720 Tobacco use: Secondary | ICD-10-CM | POA: Diagnosis not present

## 2014-12-08 DIAGNOSIS — Z7982 Long term (current) use of aspirin: Secondary | ICD-10-CM | POA: Insufficient documentation

## 2014-12-08 DIAGNOSIS — R109 Unspecified abdominal pain: Secondary | ICD-10-CM

## 2014-12-08 LAB — I-STAT CHEM 8, ED
BUN: 12 mg/dL (ref 6–20)
CHLORIDE: 102 mmol/L (ref 101–111)
CREATININE: 0.7 mg/dL (ref 0.44–1.00)
Calcium, Ion: 1.17 mmol/L (ref 1.13–1.30)
Glucose, Bld: 148 mg/dL — ABNORMAL HIGH (ref 65–99)
HCT: 50 % — ABNORMAL HIGH (ref 36.0–46.0)
HEMOGLOBIN: 17 g/dL — AB (ref 12.0–15.0)
Potassium: 3.6 mmol/L (ref 3.5–5.1)
Sodium: 140 mmol/L (ref 135–145)
TCO2: 22 mmol/L (ref 0–100)

## 2014-12-08 MED ORDER — LORAZEPAM 1 MG PO TABS
1.0000 mg | ORAL_TABLET | Freq: Once | ORAL | Status: AC
  Administered 2014-12-08: 1 mg via ORAL
  Filled 2014-12-08: qty 1

## 2014-12-08 NOTE — ED Provider Notes (Signed)
CSN: [id number]     Arrival date & time 12/08/14  2251 History   First MD Initiated Contact with Patient 12/08/14 2302     Chief Complaint  Patient presents with  . Abdominal Pain    The patient started having abdominal pain this afternoon.  The patient was seen here yesterday for the same thing and was discharge.    Patient is a 64 y.o. female presenting with abdominal pain. The history is provided by the patient.  Abdominal Pain Pain location:  Generalized Pain quality: aching   Pain radiates to:  Does not radiate Pain severity:  Severe Onset quality:  Gradual Duration: several hours. Timing:  Constant Progression:  Worsening Chronicity:  Chronic Relieved by:  Nothing Exacerbated by: pizza. Associated symptoms: no chest pain, no dysuria, no fever, no hematemesis, no hematochezia, no shortness of breath and no vomiting   Risk factors: multiple surgeries   Pt reports diffuse abd tenderness for past several hours after eating pizza She reports vomiting (nonbloody) She reports this pain is similar to her chronic abdominal pain She reports she just released from hospital for abdominal pain She reports she is followed by pain management for her ostomy pain   Past Medical History  Diagnosis Date  . Crohn's disease 1980's     diagnosis made in 1980s, status post multiple surgeries including ileostomy and total colectomy; November 29 2009 single contrast ileostomy enema performed and normal ileostomy study following total colectomy was noted with no evidence of Crohn's dz;  Marland Kitchen Depression   . Substance abuse     tobacco  . Insomnia   . 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 Colectomy  1980's     status post colectomy with right lower quadrant ileostomy, this was performed in 1980s and we have no details of the procedures,  however this was confirmed by  multiple CTs of the abdomen and pelvis, last CT of the abdomen and pelvis June 18, 2010 showed no evidence of active Crohn's disease or complications  . Laparoscopy  04/13/2010     diagnostic laparoscopy, lysis of adhesions and small bowel resection with ileostomy revision, done by Dr. Dwain Sarna;  postoperative diagnoses =  difficulty with ileostomy and ileostomy stricture,  pathology report April 13, 2010 -->  ileostomy with focal ulceration, granulation tissue and fibrosis  . Colon stricture dilatation    . Bowel resection    . Tubal ligation    . Oophorectomy     Family History  Problem Relation Age of Onset  . Crohn's disease Brother   . Cancer Father     lung  . Heart disease Father     heart attack   History  Substance Use Topics  . Smoking status: Current Every Day Smoker -- 1.00 packs/day for 30 years    Types: Cigarettes  . Smokeless tobacco: Never Used     Comment:  pack a day.    . Alcohol Use: No   OB History    No data available     Review of Systems  Constitutional: Negative for fever.  Respiratory: Negative for shortness of breath.   Cardiovascular: Negative for chest pain.  Gastrointestinal: Positive for abdominal pain. Negative for vomiting, hematochezia and hematemesis.  Genitourinary: Negative for dysuria.  All other systems reviewed and are negative.     Allergies  Codeine and Tramadol  Home  Medications   Prior to Admission medications   Medication Sig Start Date End Date Taking? Authorizing Provider  alendronate (FOSAMAX) 10 MG tablet TAKE 1 TABLET BY MOUTH DAILY BEFORE BREAKFAST. TAKE WITH FULL GLASS OF WATER ON AN EMPTY STOMACH 01/23/14   Lorenda Hatchet, MD  aspirin 325 MG tablet Take 325 mg by mouth daily.     Historical Provider, MD  citalopram (CELEXA) 20 MG tablet Take 1 tablet (20 mg total) by mouth daily. 12/07/14   John Giovanni, MD  gabapentin (NEURONTIN) 800 MG tablet Take 1 tablet (800 mg total) by mouth 3 (three) times daily.  12/07/14   John Giovanni, MD  metoprolol succinate (TOPROL-XL) 25 MG 24 hr tablet Take 1 tablet (25 mg total) by mouth daily. 07/29/14   Lorenda Hatchet, MD  mirtazapine (REMERON) 30 MG tablet Take 30 mg by mouth at bedtime. 11/18/14   Historical Provider, MD  NUCYNTA 100 MG TABS Take 100 mg by mouth 5 (five) times daily. 10/31/14   Historical Provider, MD  tiZANidine (ZANAFLEX) 4 MG tablet Take 4 mg by mouth 4 (four) times daily. 11/24/14   Historical Provider, MD  zolpidem (AMBIEN) 10 MG tablet Take 1 tablet (10 mg total) by mouth at bedtime as needed. for sleep 11/20/14   Burns Spain, MD   BP 110/90 mmHg  Pulse 79  Temp(Src) 98.2 F (36.8 C) (Oral)  Resp 18  SpO2 100%  LMP 01/26/1990 Physical Exam CONSTITUTIONAL: elderly, frail, anxious HEAD: Normocephalic/atraumatic EYES: EOMI/PERRL, no icterus ENMT: Mucous membranes moist NECK: supple no meningeal signs CV: S1/S2 noted, no murmurs/rubs/gallops noted LUNGS: Lungs are clear to auscultation bilaterally, no apparent distress ABDOMEN: soft, diffuse moderate tenderness.  Ostomy noted brownish stool noted, no bloody stool noted GU:no cva tenderness NEURO: Pt is awake/alert/appropriate, moves all extremitiesx4.  No facial droop.   EXTREMITIES: pulses normal/equal, full ROM SKIN: warm, color normal PSYCH: anxious  ED Course  Procedures  11:21 PM Pt with h/o chronic abd pain, has h/o crohns She was just discharged from hospital She had recent CT scan that was negative However she was recently admitted for pain/AKI Will check electrolytes Pt is very anxious will start with ativan 12:07 AM Pt still actively vomiting Dilaudid/phenergen IM ordered 1:34 AM Pt appears improved She is feeling closer to her daily abd pain Will try PO challenge  Pt sleeping on repeat assessment She feels she is at her chronic pain level Stable for d/c home Discussed need to call PCP later today BP 145/83 mmHg  Pulse 72  Temp(Src) 98.9 F  (37.2 C) (Oral)  Resp 23  SpO2 96%  LMP 01/26/1990  Labs Review Labs Reviewed  I-STAT CHEM 8, ED - Abnormal; Notable for the following:    Glucose, Bld 148 (*)    Hemoglobin 17.0 (*)    HCT 50.0 (*)    All other components within normal limits      EKG Interpretation   Date/Time:  Tuesday December 08 2014 23:06:58 EDT Ventricular Rate:  69 PR Interval:  133 QRS Duration: 87 QT Interval:  424 QTC Calculation: 454 R Axis:   -31 Text Interpretation:  Sinus rhythm Left axis deviation Nonspecific T  abnrm, anterolateral leads artifact noted No significant change since last  tracing Confirmed by Bebe Shaggy  MD, Kamelia Lampkins ([id number]) on 12/08/2014 11:17:44 PM     Medications  LORazepam (ATIVAN) tablet 1 mg (1 mg Oral Given 12/08/14 2349)  promethazine (PHENERGAN) injection 25 mg (25 mg Intramuscular Given 12/09/14 0035)  HYDROmorphone (DILAUDID) injection 1 mg (1 mg Intramuscular Given 12/09/14 0035)  oxyCODONE-acetaminophen (PERCOCET/ROXICET) 5-325 MG per tablet 1 tablet (1 tablet Oral Given 12/09/14 0219)    MDM   Final diagnoses:  Chronic abdominal pain  Non-intractable vomiting with nausea, vomiting of unspecified type    Nursing notes including past medical history and social history reviewed and considered in documentation Previous records reviewed and considered Labs/vital reviewed myself and considered during evaluation     Zadie Rhine, MD 12/09/14 5409

## 2014-12-08 NOTE — ED Notes (Signed)
The patient started having abdominal pain this afternoon.  The patient was seen here yesterday for the same thing and was discharge.  The patient said she was eating popsicles and then she tried to eat pizza and then she started having abdominal pain.  She rates her pain 10/10.

## 2014-12-09 ENCOUNTER — Telehealth: Payer: Self-pay | Admitting: Internal Medicine

## 2014-12-09 MED ORDER — OXYCODONE-ACETAMINOPHEN 5-325 MG PO TABS
1.0000 | ORAL_TABLET | Freq: Once | ORAL | Status: AC
  Administered 2014-12-09: 1 via ORAL
  Filled 2014-12-09: qty 1

## 2014-12-09 MED ORDER — HYDROMORPHONE HCL 1 MG/ML IJ SOLN
1.0000 mg | Freq: Once | INTRAMUSCULAR | Status: AC
  Administered 2014-12-09: 1 mg via INTRAMUSCULAR
  Filled 2014-12-09: qty 1

## 2014-12-09 MED ORDER — PROMETHAZINE HCL 25 MG/ML IJ SOLN
25.0000 mg | Freq: Once | INTRAMUSCULAR | Status: AC
  Administered 2014-12-09: 25 mg via INTRAMUSCULAR
  Filled 2014-12-09: qty 1

## 2014-12-09 NOTE — Telephone Encounter (Signed)
Call to patient to confirm appointment for 12/10/14 at 10:15 lmtcb °

## 2014-12-09 NOTE — ED Notes (Signed)
i went to discharge this pt she asked for anoither shot.  Dr Bebe Shaggy declined.  She also needs to change her colostomy bag.  Towels and wash clothes provided

## 2014-12-09 NOTE — Discharge Instructions (Signed)
°  SEEK IMMEDIATE MEDICAL ATTENTION IF: °The pain does not go away or becomes severe, particularly over the next 8-12 hours.  °A temperature above 100.4F develops.  °Repeated vomiting occurs (multiple episodes).  ° °Blood is being passed in stools or vomit (bright red or black tarry stools).  °Return also if you develop chest pain, difficulty breathing, dizziness or fainting, or become confused, poorly responsive, or inconsolable. ° °

## 2014-12-10 ENCOUNTER — Encounter: Payer: Self-pay | Admitting: Internal Medicine

## 2014-12-10 ENCOUNTER — Ambulatory Visit (INDEPENDENT_AMBULATORY_CARE_PROVIDER_SITE_OTHER): Payer: Medicare Other | Admitting: Internal Medicine

## 2014-12-10 VITALS — BP 146/103 | HR 102 | Temp 98.9°F | Ht 65.0 in | Wt 83.7 lb

## 2014-12-10 DIAGNOSIS — R109 Unspecified abdominal pain: Secondary | ICD-10-CM

## 2014-12-10 DIAGNOSIS — G8929 Other chronic pain: Secondary | ICD-10-CM

## 2014-12-10 DIAGNOSIS — K5090 Crohn's disease, unspecified, without complications: Secondary | ICD-10-CM

## 2014-12-10 NOTE — Progress Notes (Signed)
Subjective:    Patient ID: Misty [name], female    DOB: 11-07-1950, 64 y.o.   MRN: [id number]  HPI Comments: Misty [name] is a 64 year old woman with Crohn's recently admitted with flare.  Please see problem based charting for assessment and plan.     Past Medical History  Diagnosis Date  . Crohn's disease 1980's     diagnosis made in 1980s, status post multiple surgeries including ileostomy and total colectomy; November 29 2009 single contrast ileostomy enema performed and normal ileostomy study following total colectomy was noted with no evidence of Crohn's dz;  Marland Kitchen Depression   . Substance abuse     tobacco  . Insomnia   . 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 Insomnia due to medical condition 11/26/2013   Current Outpatient Prescriptions on File Prior to Visit  Medication Sig Dispense Refill  . alendronate (FOSAMAX) 10 MG tablet TAKE 1 TABLET BY MOUTH DAILY BEFORE BREAKFAST. TAKE WITH FULL GLASS OF WATER ON AN EMPTY STOMACH 30 tablet 5  . aspirin 325 MG tablet Take 325 mg by mouth daily.     . citalopram (CELEXA) 20 MG tablet Take 1 tablet (20 mg total) by mouth daily. (Patient not taking: Reported on 12/09/2014) 30 tablet 2  . gabapentin (NEURONTIN) 800 MG tablet Take 1 tablet (800 mg total) by mouth 3 (three) times daily. 90 tablet 2  . metoprolol succinate (TOPROL-XL) 25 MG 24 hr tablet Take 1 tablet (25 mg total) by mouth daily. 90 tablet 0  . mirtazapine (REMERON) 30 MG tablet Take 30 mg by mouth at bedtime.  6  . NUCYNTA 100 MG TABS Take 100 mg by mouth 5 (five) times daily.  0  . tiZANidine (ZANAFLEX) 4 MG tablet Take 4 mg by mouth 4 (four) times daily.  6  . zolpidem (AMBIEN) 10 MG tablet Take 1 tablet (10 mg total) by mouth at bedtime as needed. for sleep 90 tablet 1   No current facility-administered medications on file prior to visit.    Review of Systems  Constitutional: Positive for appetite change. Negative for fever and chills.     Drinking mostly liquids, popsicles in past few weeks since she ran out of pain meds.  Solids cause more abd pain.  Respiratory: Negative for shortness of breath.   Cardiovascular: Negative for chest pain.  Gastrointestinal: Positive for nausea and abdominal pain. Negative for constipation and blood in stool.       Liquid ostomy output since she has been limited to liquids only the past few weeks.  Genitourinary: Negative for difficulty urinating.       Filed Vitals:   12/10/14 1039  BP: 146/103  Pulse: 102  Temp: 98.9 F (37.2 C)  TempSrc: Oral  Height:  (1.651 m)  Weight: 83 lb 11.2 oz (37.966 kg)  SpO2: 100%    Objective:   Physical Exam  Constitutional: She is oriented to person, place, and time. No distress.  Very thin  HENT:  Head: Normocephalic and atraumatic.  Mouth/Throat: Oropharynx is clear and moist. No oropharyngeal exudate.  Eyes: EOM are normal. Pupils are equal, round, and reactive to light.  Neck: Neck supple.  Cardiovascular: Normal rate, regular rhythm and normal heart sounds.  Exam reveals no gallop and no friction rub.   No murmur heard. Pulmonary/Chest: Effort normal and breath sounds normal. No respiratory distress. She has no wheezes. She has no rales.  Abdominal: Soft. Bowel sounds  are normal. She exhibits no distension and no mass. There is tenderness. There is no rebound and no guarding.  Diffusely mild TTP; brown liquid ostomy output; stoma pink  Musculoskeletal: Normal range of motion. She exhibits no edema or tenderness.  Neurological: She is alert and oriented to person, place, and time. No cranial nerve deficit.  Skin: Skin is warm. She is not diaphoretic.  Psychiatric: She has a normal mood and affect. Her behavior is normal.  Vitals reviewed.         Assessment & Plan:  Please see problem based charting for assessment and plan.

## 2014-12-11 ENCOUNTER — Other Ambulatory Visit: Payer: Self-pay | Admitting: Internal Medicine

## 2014-12-11 NOTE — Progress Notes (Signed)
Medicine attending: Medical history, presenting problems, physical findings, and medications, reviewed with Dr [name] on the day of the patient visit and I concur with her evaluation and management plan  and I concur with her evaluation and management plan.

## 2014-12-11 NOTE — Telephone Encounter (Signed)
Patient requesting her nausea medication to be filled @ [facility] in [place].

## 2014-12-11 NOTE — Telephone Encounter (Signed)
Pt called with c/o nausea, onset 2 weeks.  She is taking small sips of water and crackers. In the past phenergan has helped her.  Pt seen in yesterday but forgot to ask at that time. [facility] in [place].

## 2014-12-11 NOTE — Assessment & Plan Note (Addendum)
Chronic abdominal pain worse in past few weeks since she misplaced pain med rx and has been out of the med.  Eating mostly liquids to avoid pain.  Recent CT abdomen 6 days ago unremarkable.  She says pain has improved since discharge but still significant (7/10 down from 10/10).  She follows with pain clinic for Nucynta and gabapentin.  She says she cannot find the rx for Nucynta that she was given last month and she wants to know if I can write her an rx for Nucynta. I explain that I am unable to do so and she is understanding of this.  She has an upcoming appt with pain management next week (08/03) and can get a refill then.  She is able to stay hydrated at home.   - we called over to [facility] during the visit and arranged an appt with for next Friday 08/05 (this was first available) because she has not been seen for her Crohn's in quite some time and is clearly having pain - continue gabapentin - to pain man next week for Nucynta refill - to ED with severe N/V, worsening abd pain

## 2014-12-14 ENCOUNTER — Other Ambulatory Visit: Payer: Self-pay | Admitting: Internal Medicine

## 2014-12-14 DIAGNOSIS — R112 Nausea with vomiting, unspecified: Secondary | ICD-10-CM

## 2014-12-14 MED ORDER — PROMETHAZINE HCL 12.5 MG PO TABS: 12.5000 mg | ORAL_TABLET | Freq: Three times a day (TID) | ORAL | Status: AC | PRN

## 2014-12-18 ENCOUNTER — Ambulatory Visit (INDEPENDENT_AMBULATORY_CARE_PROVIDER_SITE_OTHER): Payer: Medicare Other | Admitting: Gastroenterology

## 2014-12-18 ENCOUNTER — Encounter: Payer: Self-pay | Admitting: *Deleted

## 2014-12-18 VITALS — BP 120/70 | HR 74 | Ht 65.0 in | Wt 80.6 lb

## 2014-12-18 DIAGNOSIS — E43 Unspecified severe protein-calorie malnutrition: Secondary | ICD-10-CM

## 2014-12-18 DIAGNOSIS — R109 Unspecified abdominal pain: Secondary | ICD-10-CM | POA: Diagnosis not present

## 2014-12-18 DIAGNOSIS — K50919 Crohn's disease, unspecified, with unspecified complications: Secondary | ICD-10-CM

## 2014-12-18 DIAGNOSIS — G8929 Other chronic pain: Secondary | ICD-10-CM

## 2014-12-18 NOTE — Progress Notes (Signed)
12/18/2014 Misty [name] [id number] 05-Jan-1951   History of Present Illness:  This is a 64 year old female with a history of Crohn's disease, status post remote colectomy/ileostomy (in her 98's). She has been seen here in the past for complaints of pain around her stoma.  This is a chronic issue and thought to be nerve related.  Follows at pain clinic, but continues to have ongoing pain.  Patient had a flexible sigmoidoscopy by Dr. Christella Hartigan in October 2011 for evaluation of pain at ileostomy site. 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 Biopsies of ileum, as well as neoterminal ileum were normal.  Recent CT scan of the abdomen and pelvis without contrast was unremarkable for any issues including active Crohn's disease.  Has not been on any medications for her Crohn's disease for several years.  Her pain occurs after eating when bowel contents ext through the stoma so she does not eat or drink much at all because of the pain.  She has been admitted to the hospital on several occasions for dehydration and acute renal failure; was admitted recently when CT scan was performed.  She was told to follow-up with GI, which she says why she is here today but did not really expect [place] to do anything for her as we had said that there was nothing that we could offer her in the past.  Apparently she was supposed to have the ostomy site relocated in 2012 but had a irregular heartbeat so it was never performed.  She has lost 20 pounds since she was last seen here almost 3 years ago and is extremely thin and chronically ill-appearing.  I asked her if she ever had home health arranged to come and give her IV nutrition or IVF's to prevent hospital visits and get her some nourishment, but she said she is not ready for that because she has bad veins and she has several pets at home and cannot really have people coming to her house all the time.  Her  albumin is actually normal at 4.7.  She admits to occasional abdominal cramping, but nothing persistent.  Ostomy output is at baseline. No blood in stool.   Current Medications, Allergies, Past Medical History, Past Surgical History, Family History and Social History were reviewed in [employer] record.   Physical Exam: BP 120/70 mmHg  Pulse 74  Ht  (1.651 m)  Wt 80 lb 9.6 oz (36.56 kg)  BMI 13.41 kg/m2  LMP 01/26/1990 General: Cachectic appearing white female in no acute distress Head: Normocephalic and atraumatic Eyes:  Sclerae anicteric, conjunctiva pink  Ears: Normal auditory acuity Lungs: Clear throughout to auscultation Heart: Regular rate and rhythm Abdomen: Soft, non-distended.  Opaque ostomy collection bag in right lower quadrant with small amount of yellowish/tan colored drainage/contents.  BS present and normal.   Musculoskeletal: Symmetrical with no gross deformities  Extremities: No edema  Neurological: Alert oriented x 4, grossly non-focal Psychological:  Alert and cooperative.  Depressed mood and tearful.  Assessment and Recommendations: 1. Remote colectomy/ileostomy for Crohn's disease. No active disease at time of last lower endoscopy/ileoscopy in 2011 and nothing seen on recent CT scan.   2. Chronic, postprandial stomal pain.  This has been present since at lease 2011.  Pain is superficial, stinging in nature and involving only the stoma. Patient denies any significant abdominal pain ostomy output changes, physical abnormalities of the ostomy itself. This does  not sound like an intra-abdominal process such as active Crohn's disease. Unfortunately cause of rhis localized pain is unclear, but thought to be nerve related.  Follows with pain management.  Discussed with Dr. Marina Goodell.  Nothing ot offer for patient from GI standpoint.  Discussed referral back to surgery to reconsider moving the ileostomy location, however, patient declined for now and  said she "would like to think about it".  3.  Severe protein calorie malnutrition

## 2014-12-19 NOTE — Progress Notes (Signed)
i agree with the above note, plan 

## 2015-01-15 ENCOUNTER — Ambulatory Visit (INDEPENDENT_AMBULATORY_CARE_PROVIDER_SITE_OTHER): Payer: Medicare Other | Admitting: Internal Medicine

## 2015-01-15 ENCOUNTER — Encounter: Payer: Self-pay | Admitting: Internal Medicine

## 2015-01-15 VITALS — BP 108/65 | HR 70 | Temp 98.5°F | Ht 65.0 in | Wt 97.2 lb

## 2015-01-15 DIAGNOSIS — R1031 Right lower quadrant pain: Secondary | ICD-10-CM | POA: Diagnosis not present

## 2015-01-15 DIAGNOSIS — Z8679 Personal history of other diseases of the circulatory system: Secondary | ICD-10-CM | POA: Diagnosis not present

## 2015-01-15 DIAGNOSIS — F17210 Nicotine dependence, cigarettes, uncomplicated: Secondary | ICD-10-CM | POA: Diagnosis not present

## 2015-01-15 DIAGNOSIS — G8929 Other chronic pain: Secondary | ICD-10-CM

## 2015-01-15 DIAGNOSIS — R109 Unspecified abdominal pain: Principal | ICD-10-CM

## 2015-01-15 NOTE — Assessment & Plan Note (Signed)
Patient describes her pain as been superficial and only around the stoma, she has had this pain for a very long time, but says over the past 15 years the pain has been getting worse. She has tried several things for it, including Gabapentin which she is presently taking at  [facility], she has tried lyrica in the past, Also tried amityptiline, all have not been effective. She applies OTC bactine, which contains Lidocaine, but this provides relief for a few minutes, she is presently going top a pain clinic and taking Oxycodone- 10 Q6H, this helps with the pain. But today she is requesting a refferal to neurology for nerve block. Her weight has increased since discharge, she is normal bowel movements, no vomiting, no blood in stools, no dizziness.   Plan- Unsure how a nerve block will interfere with the function of the stoma, therefore recommended that she see her Gastroenterologist to discuss this option. If he agrees it is a good idea, her pain clinic can do this, but if they cannot we can put in the refferal for nerve block - Orthopedics, or ortho Spine. She does not need to come in. - For now cont wit gabapentin

## 2015-01-15 NOTE — Assessment & Plan Note (Signed)
Bloo dpressure was initially low- 99/54, not tachycardic, no hx of fluid loss or reason to be dehydrated, she is on metoprolol. She denied dizziness or weakness. Rechecked- Bp- 108/65.  Plan- Cont current med.

## 2015-01-15 NOTE — Patient Instructions (Signed)
Please talk to your gastroenterologist about the nerve block if they think its a good idea. If it is then ask your pain clinic if they will do it, if they will not we can put in a referral to a back surgeon/orthopedist for back injections.

## 2015-01-15 NOTE — Progress Notes (Signed)
Patient ID: Misty [name], female   DOB: 1950-12-16, 64 y.o.   MRN: [id number]   Subjective:   Patient ID: Misty [name] female   DOB: [birth year] 64 y.o.   MRN: [id number]  HPI: Misty [name] is a 64 y.o. with PMH listed below. Presented with c/o of abdominal pain, only around the stomal site. Please see problem based charting for more details.    Past Medical History  Diagnosis Date  . Crohn's disease 1980's     diagnosis made in 1980s, status post multiple surgeries including ileostomy and total colectomy; November 29 2009 single contrast ileostomy enema performed and normal ileostomy study following total colectomy was noted with no evidence of Crohn's dz;  Marland Kitchen Depression   . Substance abuse     tobacco  . Insomnia   . 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 Protein calorie malnutrition    Current Outpatient Prescriptions  Medication Sig Dispense Refill  . alendronate (FOSAMAX) 10 MG tablet TAKE 1 TABLET BY MOUTH DAILY BEFORE BREAKFAST. TAKE WITH FULL GLASS OF WATER ON AN EMPTY STOMACH 30 tablet 5  . aspirin 325 MG tablet Take 325 mg by mouth daily.     . citalopram (CELEXA) 20 MG tablet Take 1 tablet (20 mg total) by mouth daily. 30 tablet 2  . gabapentin (NEURONTIN) 800 MG tablet Take 1 tablet (800 mg total) by mouth 3 (three) times daily. (Patient taking differently: Take 800 mg by mouth. Take one six times a day) 90 tablet 2  . metoprolol succinate (TOPROL-XL) 25 MG 24 hr tablet Take 1 tablet (25 mg total) by mouth daily. 90 tablet 0  . mirtazapine (REMERON) 30 MG tablet Take 30 mg by mouth at bedtime.  6  . promethazine (PHENERGAN) 12.5 MG tablet Take 1 tablet (12.5 mg total) by mouth every 8 (eight) hours as needed for nausea or vomiting. 30 tablet 0  . tiZANidine (ZANAFLEX) 4 MG tablet Take 4 mg by mouth 4 (four) times daily.  6  . zolpidem (AMBIEN) 10 MG tablet Take 1 tablet (10 mg total) by  mouth at bedtime as needed. for sleep 90 tablet 1   No current facility-administered medications for this visit.   Family History  Problem Relation Age of Onset  . Crohn's disease Brother   . Cancer Father     lung  . Heart disease Father     heart attack   Social History   Social History  . Marital Status: Divorced    Spouse Name: N/A  . Number of Children: N/A  . Years of Education: N/A   Occupational History  .      disability   Social History Main Topics  . Smoking status: Current Every Day Smoker -- 1.00 packs/day for 30 years    Types: Cigarettes  . Smokeless tobacco: Never Used     Comment:  pack a day.  . Alcohol Use: No  . Drug Use: No  . Sexual Activity: Not Asked   Other Topics Concern  . None   Social History Narrative   Review of Systems: CONSTITUTIONAL- No Fever, appetite has improve since discharge. SKIN- No Rash, colour changes or itching. HEAD- No Headache or dizziness. RESPIRATORY- No Cough or SOB. CARDIAC- No chest pain. GI- No vomiting, diarrhea, abd pain. URINARY- No Frequency, dysuria. NEUROLOGIC- Pain around stomal site  Objective:  Physical Exam: Filed Vitals:   01/15/15 1111  01/15/15 1131  BP: 99/54 108/65  Pulse: 70 70  Temp: 98.5 F (36.9 C)   TempSrc: Oral   Height: 5\' 5"  (1.651 m)   Weight: 97 lb 3.2 oz (44.09 kg)   SpO2: 96%    GENERAL- alert, co-operative, appears as stated age, not in any distress. HEENT- Atraumatic, normocephalic, PERRL CARDIAC- RRR, no murmurs, rubs or gallops. RESP- Moving equal volumes of air, and clear to auscultation bilaterally, no wheezes or crackles. ABDOMEN- Soft, nontender, stoma on right side of the abdomen lat to umbilicus, no surounding erthyema,  bowel sounds present. NEURO- No obvious Cr N abnormality, Gait- Normal. EXTREMITIES- pulse 2+, symmetric, no pedal edema. SKIN- Warm, dry, No rash or lesion. PSYCH- Normal mood and affect, appropriate thought content and speech.  Assessment  & Plan:   The patient's case and plan of care was discussed with attending physician, Dr. Criselda Peaches.  Please see problem based charting for assessment and plan.

## 2015-01-21 NOTE — Progress Notes (Signed)
Internal Medicine Clinic Attending  Case discussed with Dr. Emokpae soon after the resident saw the patient.  We reviewed the resident's history and exam and pertinent patient test results.  I agree with the assessment, diagnosis, and plan of care documented in the resident's note. 

## 2015-01-22 ENCOUNTER — Other Ambulatory Visit: Payer: Self-pay | Admitting: Internal Medicine

## 2015-01-22 ENCOUNTER — Telehealth: Payer: Self-pay | Admitting: *Deleted

## 2015-01-22 NOTE — Telephone Encounter (Signed)
Agree that the prescription should not be filled at this time. Needs to be seen in clinic.

## 2015-01-22 NOTE — Telephone Encounter (Signed)
Per pharm, last fill was July for 3 months, should have enough, tried to call pt, no answer, lm for rtc

## 2015-01-22 NOTE — Telephone Encounter (Signed)
Pt was given a 3 month supply of zolpidem 11/20/2014, this should last til 02/20/2015 but pt is out and has been "for a few days", she states when she was sick in July she took a few extra to be able to sleep during the day and at night. She is informed that the [place] cannot be refilled until October, that she should only take all her medicines as prescribed. She was offered an appt and refused.

## 2015-01-22 NOTE — Telephone Encounter (Signed)
Pt requesting ambien to be filled @ [facility].

## 2015-02-18 ENCOUNTER — Other Ambulatory Visit: Payer: Self-pay | Admitting: Internal Medicine

## 2015-02-18 NOTE — Telephone Encounter (Signed)
Pt informed refills remain at [facility].  [facility] can transfer rx if pt desires.

## 2015-02-18 NOTE — Telephone Encounter (Signed)
Pt called requesting Ambien to be filled @ [facility]. °

## 2015-02-19 ENCOUNTER — Telehealth: Payer: Self-pay | Admitting: Internal Medicine

## 2015-02-19 NOTE — Telephone Encounter (Signed)
Please call pt back regarding Rx. °

## 2015-02-19 NOTE — Telephone Encounter (Signed)
This [facility] was [facility], they have no record [facility] [employer], it was originally called to [facility]- [facility] no refills on 7/8, it was #[facility] w/ 1 refill Have corrected the script, it is now at [facility] correctly

## 2015-04-06 ENCOUNTER — Encounter: Payer: Self-pay | Admitting: Student

## 2015-04-13 ENCOUNTER — Encounter: Payer: Self-pay | Admitting: Internal Medicine

## 2015-04-13 ENCOUNTER — Emergency Department (HOSPITAL_COMMUNITY): Payer: Medicare Other

## 2015-04-13 ENCOUNTER — Inpatient Hospital Stay (HOSPITAL_COMMUNITY)
Admission: EM | Admit: 2015-04-13 | Discharge: 2015-04-17 | DRG: 551 | Disposition: A | Discharge status: Skilled Nursing Facility | Payer: Medicare Other | Attending: Internal Medicine | Admitting: Internal Medicine

## 2015-04-13 ENCOUNTER — Encounter (HOSPITAL_COMMUNITY): Payer: Self-pay

## 2015-04-13 DIAGNOSIS — J209 Acute bronchitis, unspecified: Secondary | ICD-10-CM | POA: Diagnosis present

## 2015-04-13 DIAGNOSIS — F1120 Opioid dependence, uncomplicated: Secondary | ICD-10-CM | POA: Diagnosis present

## 2015-04-13 DIAGNOSIS — F329 Major depressive disorder, single episode, unspecified: Secondary | ICD-10-CM | POA: Diagnosis present

## 2015-04-13 DIAGNOSIS — Z933 Colostomy status: Secondary | ICD-10-CM | POA: Diagnosis not present

## 2015-04-13 DIAGNOSIS — E43 Unspecified severe protein-calorie malnutrition: Secondary | ICD-10-CM | POA: Diagnosis present

## 2015-04-13 DIAGNOSIS — Z932 Ileostomy status: Secondary | ICD-10-CM

## 2015-04-13 DIAGNOSIS — Y92410 Unspecified street and highway as the place of occurrence of the external cause: Secondary | ICD-10-CM | POA: Diagnosis not present

## 2015-04-13 DIAGNOSIS — D72829 Elevated white blood cell count, unspecified: Secondary | ICD-10-CM | POA: Insufficient documentation

## 2015-04-13 DIAGNOSIS — J449 Chronic obstructive pulmonary disease, unspecified: Secondary | ICD-10-CM | POA: Insufficient documentation

## 2015-04-13 DIAGNOSIS — J440 Chronic obstructive pulmonary disease with acute lower respiratory infection: Secondary | ICD-10-CM

## 2015-04-13 DIAGNOSIS — S22000A Wedge compression fracture of unspecified thoracic vertebra, initial encounter for closed fracture: Secondary | ICD-10-CM | POA: Diagnosis present

## 2015-04-13 DIAGNOSIS — F17200 Nicotine dependence, unspecified, uncomplicated: Secondary | ICD-10-CM | POA: Diagnosis present

## 2015-04-13 DIAGNOSIS — Z79899 Other long term (current) drug therapy: Secondary | ICD-10-CM

## 2015-04-13 DIAGNOSIS — K5090 Crohn's disease, unspecified, without complications: Secondary | ICD-10-CM | POA: Diagnosis present

## 2015-04-13 DIAGNOSIS — M810 Age-related osteoporosis without current pathological fracture: Secondary | ICD-10-CM | POA: Diagnosis present

## 2015-04-13 DIAGNOSIS — S22070A Wedge compression fracture of T9-T10 vertebra, initial encounter for closed fracture: Secondary | ICD-10-CM | POA: Diagnosis not present

## 2015-04-13 DIAGNOSIS — J069 Acute upper respiratory infection, unspecified: Secondary | ICD-10-CM | POA: Diagnosis not present

## 2015-04-13 DIAGNOSIS — S22078A Other fracture of T9-T10 vertebra, initial encounter for closed fracture: Secondary | ICD-10-CM | POA: Diagnosis present

## 2015-04-13 DIAGNOSIS — K529 Noninfective gastroenteritis and colitis, unspecified: Secondary | ICD-10-CM

## 2015-04-13 DIAGNOSIS — R0902 Hypoxemia: Secondary | ICD-10-CM | POA: Diagnosis present

## 2015-04-13 DIAGNOSIS — Z23 Encounter for immunization: Secondary | ICD-10-CM

## 2015-04-13 DIAGNOSIS — A419 Sepsis, unspecified organism: Secondary | ICD-10-CM

## 2015-04-13 DIAGNOSIS — F17210 Nicotine dependence, cigarettes, uncomplicated: Secondary | ICD-10-CM | POA: Diagnosis not present

## 2015-04-13 DIAGNOSIS — R509 Fever, unspecified: Secondary | ICD-10-CM

## 2015-04-13 DIAGNOSIS — J439 Emphysema, unspecified: Secondary | ICD-10-CM

## 2015-04-13 DIAGNOSIS — J189 Pneumonia, unspecified organism: Secondary | ICD-10-CM

## 2015-04-13 HISTORY — DX: Noninfective gastroenteritis and colitis, unspecified: K52.9

## 2015-04-13 HISTORY — DX: Chronic obstructive pulmonary disease with (acute) lower respiratory infection: J44.0

## 2015-04-13 HISTORY — DX: Opioid use, unspecified, uncomplicated: F11.90

## 2015-04-13 HISTORY — DX: Ileostomy status: Z93.2

## 2015-04-13 HISTORY — DX: Chronic obstructive pulmonary disease, unspecified: J44.9

## 2015-04-13 LAB — CBC WITH DIFFERENTIAL/PLATELET
BASOS ABS: 0 10*3/uL (ref 0.0–0.1)
BASOS PCT: 0 %
EOS ABS: 0.1 10*3/uL (ref 0.0–0.7)
Eosinophils Relative: 1 %
HEMATOCRIT: 41.3 % (ref 36.0–46.0)
HEMOGLOBIN: 13.3 g/dL (ref 12.0–15.0)
Lymphocytes Relative: 6 %
Lymphs Abs: 1 10*3/uL (ref 0.7–4.0)
MCH: 30 pg (ref 26.0–34.0)
MCHC: 32.2 g/dL (ref 30.0–36.0)
MCV: 93 fL (ref 78.0–100.0)
MONOS PCT: 5 %
Monocytes Absolute: 0.8 10*3/uL (ref 0.1–1.0)
NEUTROS ABS: 14.6 10*3/uL — AB (ref 1.7–7.7)
NEUTROS PCT: 88 %
Platelets: 212 10*3/uL (ref 150–400)
RBC: 4.44 MIL/uL (ref 3.87–5.11)
RDW: 12.6 % (ref 11.5–15.5)
WBC: 16.5 10*3/uL — AB (ref 4.0–10.5)

## 2015-04-13 LAB — URINALYSIS, ROUTINE W REFLEX MICROSCOPIC
Bilirubin Urine: NEGATIVE
Glucose, UA: NEGATIVE mg/dL
Ketones, ur: NEGATIVE mg/dL
LEUKOCYTES UA: NEGATIVE
NITRITE: NEGATIVE
PH: 5 (ref 5.0–8.0)
Protein, ur: NEGATIVE mg/dL
SPECIFIC GRAVITY, URINE: 1.012 (ref 1.005–1.030)

## 2015-04-13 LAB — COMPREHENSIVE METABOLIC PANEL
ALK PHOS: 120 U/L (ref 38–126)
ALT: 19 U/L (ref 14–54)
ANION GAP: 9 (ref 5–15)
AST: 24 U/L (ref 15–41)
Albumin: 3.3 g/dL — ABNORMAL LOW (ref 3.5–5.0)
BUN: 13 mg/dL (ref 6–20)
CALCIUM: 9.2 mg/dL (ref 8.9–10.3)
CO2: 25 mmol/L (ref 22–32)
Chloride: 100 mmol/L — ABNORMAL LOW (ref 101–111)
Creatinine, Ser: 1.14 mg/dL — ABNORMAL HIGH (ref 0.44–1.00)
GFR, EST AFRICAN AMERICAN: 58 mL/min — AB (ref >60)
GFR, EST NON AFRICAN AMERICAN: 50 mL/min — AB (ref >60)
Glucose, Bld: 110 mg/dL — ABNORMAL HIGH (ref 65–99)
Potassium: 4.7 mmol/L (ref 3.5–5.1)
SODIUM: 134 mmol/L — AB (ref 135–145)
Total Bilirubin: 0.3 mg/dL (ref 0.3–1.2)
Total Protein: 7 g/dL (ref 6.5–8.1)

## 2015-04-13 LAB — I-STAT CG4 LACTIC ACID, ED
LACTIC ACID, VENOUS: 1.26 mmol/L (ref 0.5–2.0)
Lactic Acid, Venous: 0.69 mmol/L (ref 0.5–2.0)

## 2015-04-13 LAB — URINE MICROSCOPIC-ADD ON

## 2015-04-13 LAB — RAPID URINE DRUG SCREEN, HOSP PERFORMED
AMPHETAMINES: NOT DETECTED
BENZODIAZEPINES: NOT DETECTED
Barbiturates: NOT DETECTED
Cocaine: NOT DETECTED
OPIATES: POSITIVE — AB
Tetrahydrocannabinol: POSITIVE — AB

## 2015-04-13 LAB — PROCALCITONIN: PROCALCITONIN: 0.23 ng/mL

## 2015-04-13 LAB — ETHANOL

## 2015-04-13 LAB — PROTIME-INR
INR: 1.14 (ref 0.00–1.49)
PROTHROMBIN TIME: 14.8 s (ref 11.6–15.2)

## 2015-04-13 LAB — SAMPLE TO BLOOD BANK

## 2015-04-13 LAB — STREP PNEUMONIAE URINARY ANTIGEN: STREP PNEUMO URINARY ANTIGEN: NEGATIVE

## 2015-04-13 MED ORDER — IPRATROPIUM-ALBUTEROL 0.5-2.5 (3) MG/3ML IN SOLN
3.0000 mL | Freq: Four times a day (QID) | RESPIRATORY_TRACT | Status: DC
  Administered 2015-04-14 (×4): 3 mL via RESPIRATORY_TRACT
  Filled 2015-04-13 (×4): qty 3

## 2015-04-13 MED ORDER — SODIUM CHLORIDE 0.9 % IV SOLN
INTRAVENOUS | Status: AC
  Administered 2015-04-13 – 2015-04-14 (×2): via INTRAVENOUS

## 2015-04-13 MED ORDER — GABAPENTIN 400 MG PO CAPS
800.0000 mg | ORAL_CAPSULE | Freq: Three times a day (TID) | ORAL | Status: DC
  Administered 2015-04-13 – 2015-04-17 (×15): 800 mg via ORAL
  Filled 2015-04-13 (×13): qty 2

## 2015-04-13 MED ORDER — ENOXAPARIN SODIUM 40 MG/0.4ML ~~LOC~~ SOLN
40.0000 mg | Freq: Every day | SUBCUTANEOUS | Status: DC
  Administered 2015-04-14 – 2015-04-17 (×4): 40 mg via SUBCUTANEOUS
  Filled 2015-04-13 (×5): qty 0.4

## 2015-04-13 MED ORDER — DM-GUAIFENESIN ER 30-600 MG PO TB12
1.0000 | ORAL_TABLET | Freq: Two times a day (BID) | ORAL | Status: DC
  Administered 2015-04-14 – 2015-04-17 (×8): 1 via ORAL
  Filled 2015-04-13 (×8): qty 1

## 2015-04-13 MED ORDER — SENNOSIDES-DOCUSATE SODIUM 8.6-50 MG PO TABS
1.0000 | ORAL_TABLET | Freq: Two times a day (BID) | ORAL | Status: DC
  Administered 2015-04-14 (×2): 1 via ORAL
  Filled 2015-04-13 (×4): qty 1

## 2015-04-13 MED ORDER — ALENDRONATE SODIUM 10 MG PO TABS: 10.0000 mg | ORAL_TABLET | Freq: Every day | ORAL | Status: DC

## 2015-04-13 MED ORDER — FENTANYL CITRATE (PF) 100 MCG/2ML IJ SOLN
50.0000 ug | INTRAMUSCULAR | Status: DC | PRN
  Administered 2015-04-13 (×2): 50 ug via INTRAVENOUS
  Filled 2015-04-13 (×2): qty 2

## 2015-04-13 MED ORDER — ENOXAPARIN SODIUM 40 MG/0.4ML ~~LOC~~ SOLN: 40.0000 mg | SUBCUTANEOUS | Status: DC

## 2015-04-13 MED ORDER — SODIUM CHLORIDE 0.9 % IV BOLUS (SEPSIS)
1000.0000 mL | Freq: Once | INTRAVENOUS | Status: AC
  Administered 2015-04-13: 1000 mL via INTRAVENOUS

## 2015-04-13 MED ORDER — OXYCODONE HCL 5 MG PO TABS
15.0000 mg | ORAL_TABLET | Freq: Four times a day (QID) | ORAL | Status: DC | PRN
  Administered 2015-04-13: 15 mg via ORAL
  Filled 2015-04-13: qty 3

## 2015-04-13 MED ORDER — IPRATROPIUM-ALBUTEROL 0.5-2.5 (3) MG/3ML IN SOLN
3.0000 mL | Freq: Once | RESPIRATORY_TRACT | Status: AC
  Administered 2015-04-13: 3 mL via RESPIRATORY_TRACT
  Filled 2015-04-13: qty 3

## 2015-04-13 MED ORDER — MORPHINE SULFATE (PF) 2 MG/ML IV SOLN
1.0000 mg | INTRAVENOUS | Status: DC | PRN
  Administered 2015-04-14 – 2015-04-15 (×4): 1 mg via INTRAVENOUS
  Filled 2015-04-13 (×4): qty 1

## 2015-04-13 MED ORDER — LIDOCAINE 5 % EX PTCH
1.0000 | MEDICATED_PATCH | CUTANEOUS | Status: DC
  Administered 2015-04-13 – 2015-04-16 (×4): 1 via TRANSDERMAL
  Filled 2015-04-13 (×5): qty 1

## 2015-04-13 MED ORDER — LEVOFLOXACIN IN D5W 750 MG/150ML IV SOLN
750.0000 mg | Freq: Once | INTRAVENOUS | Status: AC
  Administered 2015-04-13: 750 mg via INTRAVENOUS
  Filled 2015-04-13: qty 150

## 2015-04-13 MED ORDER — OXYCODONE HCL 5 MG PO TABS
10.0000 mg | ORAL_TABLET | ORAL | Status: DC | PRN
  Administered 2015-04-14 – 2015-04-15 (×3): 10 mg via ORAL
  Filled 2015-04-13 (×3): qty 2

## 2015-04-13 MED ORDER — NICOTINE 7 MG/24HR TD PT24
7.0000 mg | MEDICATED_PATCH | Freq: Every day | TRANSDERMAL | Status: DC
  Administered 2015-04-14 – 2015-04-17 (×5): 7 mg via TRANSDERMAL
  Filled 2015-04-13 (×5): qty 1

## 2015-04-13 MED ORDER — ACETAMINOPHEN 325 MG PO TABS
650.0000 mg | ORAL_TABLET | ORAL | Status: DC
  Administered 2015-04-13 – 2015-04-15 (×11): 650 mg via ORAL
  Filled 2015-04-13 (×10): qty 2

## 2015-04-13 MED ORDER — LEVOFLOXACIN IN D5W 750 MG/150ML IV SOLN: 750.0000 mg | INTRAVENOUS | Status: DC

## 2015-04-13 MED ORDER — HYDROMORPHONE HCL 1 MG/ML IJ SOLN
1.0000 mg | Freq: Once | INTRAMUSCULAR | Status: AC
  Administered 2015-04-13: 1 mg via INTRAVENOUS
  Filled 2015-04-13: qty 1

## 2015-04-13 MED ORDER — METHOCARBAMOL 500 MG PO TABS: 500.0000 mg | ORAL_TABLET | Freq: Three times a day (TID) | ORAL | Status: DC | PRN

## 2015-04-13 MED ORDER — CALCIUM CARBONATE-VITAMIN D 500-200 MG-UNIT PO TABS
1.0000 | ORAL_TABLET | Freq: Two times a day (BID) | ORAL | Status: DC
  Administered 2015-04-14 – 2015-04-17 (×7): 1 via ORAL
  Filled 2015-04-13 (×8): qty 1

## 2015-04-13 MED ORDER — ONDANSETRON HCL 4 MG/2ML IJ SOLN
4.0000 mg | INTRAMUSCULAR | Status: DC | PRN
  Administered 2015-04-13: 4 mg via INTRAVENOUS
  Filled 2015-04-13: qty 2

## 2015-04-13 NOTE — Progress Notes (Signed)
Report obtained and nurse notified that patient needs stepped down that stats at 93 on 6L keep head of bed up to 35% to keep stats up. And every hour fentanyl ordered for pain. Charge nurse notified
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
## 2015-04-13 NOTE — ED Notes (Signed)
This RN and second RN have attempted blood draw and IV stick X5. Unable to obtain. MD aware.

## 2015-04-13 NOTE — ED Notes (Signed)
Pt O2 level at 85% on 4L, of O2. Increased to 6L with O2 up to 92%

## 2015-04-13 NOTE — Progress Notes (Signed)
Called by [facility] to evaluate pt for appropriateness for med-surg admission.  On assessment RR 34, sats 93% on 6L and getting an albuterol neb tx.  Pt is taking shallow respirations limited by pain from trauma.  She denies having a prior pulmonary hx though admits to smoking.  She is only able to carry a broken conversation.  Discussed with Dr. Delane Ginger concerns about the appropriateness for this pt to go to the floor.  Dr. Delane Ginger states she feels comfortable with this pt going to a med-surg floor and states she has a prior pulmonary hx (undocumented). She agrees to let pt be monitored more closely on SDU as patient safety is a priority.

## 2015-04-13 NOTE — ED Notes (Signed)
Attempted report 
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
## 2015-04-13 NOTE — ED Notes (Signed)
Admitting MD at bedside.

## 2015-04-13 NOTE — ED Notes (Signed)
Admitting ED aware of O2 levels.

## 2015-04-13 NOTE — ED Notes (Signed)
Dr.Gill at the bedside.
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
## 2015-04-13 NOTE — Progress Notes (Signed)

## 2015-04-13 NOTE — ED Notes (Addendum)
Gave report to [employer]. RN called back saying that [facility] could not accept that patient after pt. Had left floor before phone call. Rapid response informed ED that PRN 1hr fentanyl was no appropriate for that unit.
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
## 2015-04-13 NOTE — ED Notes (Signed)
Spoke to Dr. Delane Ginger concerning pt bed status. Will follow up.
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
## 2015-04-14 ENCOUNTER — Encounter (HOSPITAL_COMMUNITY): Payer: Self-pay | Admitting: Internal Medicine

## 2015-04-14 DIAGNOSIS — Y92410 Unspecified street and highway as the place of occurrence of the external cause: Secondary | ICD-10-CM

## 2015-04-14 DIAGNOSIS — K529 Noninfective gastroenteritis and colitis, unspecified: Secondary | ICD-10-CM

## 2015-04-14 DIAGNOSIS — J440 Chronic obstructive pulmonary disease with acute lower respiratory infection: Secondary | ICD-10-CM

## 2015-04-14 DIAGNOSIS — R0902 Hypoxemia: Secondary | ICD-10-CM

## 2015-04-14 DIAGNOSIS — J069 Acute upper respiratory infection, unspecified: Secondary | ICD-10-CM

## 2015-04-14 DIAGNOSIS — E43 Unspecified severe protein-calorie malnutrition: Secondary | ICD-10-CM | POA: Diagnosis present

## 2015-04-14 DIAGNOSIS — J449 Chronic obstructive pulmonary disease, unspecified: Secondary | ICD-10-CM

## 2015-04-14 DIAGNOSIS — G629 Polyneuropathy, unspecified: Secondary | ICD-10-CM

## 2015-04-14 DIAGNOSIS — F17210 Nicotine dependence, cigarettes, uncomplicated: Secondary | ICD-10-CM

## 2015-04-14 DIAGNOSIS — J209 Acute bronchitis, unspecified: Secondary | ICD-10-CM

## 2015-04-14 DIAGNOSIS — K5090 Crohn's disease, unspecified, without complications: Secondary | ICD-10-CM

## 2015-04-14 DIAGNOSIS — D72829 Elevated white blood cell count, unspecified: Secondary | ICD-10-CM | POA: Insufficient documentation

## 2015-04-14 DIAGNOSIS — Z932 Ileostomy status: Secondary | ICD-10-CM

## 2015-04-14 DIAGNOSIS — F329 Major depressive disorder, single episode, unspecified: Secondary | ICD-10-CM

## 2015-04-14 DIAGNOSIS — S22070A Wedge compression fracture of T9-T10 vertebra, initial encounter for closed fracture: Secondary | ICD-10-CM

## 2015-04-14 HISTORY — DX: Noninfective gastroenteritis and colitis, unspecified: K52.9

## 2015-04-14 HISTORY — DX: Chronic obstructive pulmonary disease with (acute) lower respiratory infection: J44.0

## 2015-04-14 HISTORY — DX: Ileostomy status: Z93.2

## 2015-04-14 HISTORY — DX: Acute bronchitis, unspecified: J20.9

## 2015-04-14 LAB — COMPREHENSIVE METABOLIC PANEL
ALBUMIN: 2.4 g/dL — AB (ref 3.5–5.0)
ALK PHOS: 91 U/L (ref 38–126)
ALT: 13 U/L — AB (ref 14–54)
ANION GAP: 6 (ref 5–15)
AST: 15 U/L (ref 15–41)
BILIRUBIN TOTAL: 0.8 mg/dL (ref 0.3–1.2)
BUN: 10 mg/dL (ref 6–20)
CALCIUM: 8 mg/dL — AB (ref 8.9–10.3)
CO2: 24 mmol/L (ref 22–32)
CREATININE: 0.96 mg/dL (ref 0.44–1.00)
Chloride: 106 mmol/L (ref 101–111)
GFR calc Af Amer: >60 mL/min (ref >60)
GFR calc non Af Amer: >60 mL/min (ref >60)
GLUCOSE: 97 mg/dL (ref 65–99)
Potassium: 3.8 mmol/L (ref 3.5–5.1)
SODIUM: 136 mmol/L (ref 135–145)
Total Protein: 5.3 g/dL — ABNORMAL LOW (ref 6.5–8.1)

## 2015-04-14 LAB — CBC WITH DIFFERENTIAL/PLATELET
BASOS ABS: 0 10*3/uL (ref 0.0–0.1)
Basophils Relative: 0 %
EOS ABS: 0.4 10*3/uL (ref 0.0–0.7)
EOS PCT: 3 %
HCT: 38.5 % (ref 36.0–46.0)
Hemoglobin: 11.9 g/dL — ABNORMAL LOW (ref 12.0–15.0)
Lymphocytes Relative: 11 %
Lymphs Abs: 1.3 10*3/uL (ref 0.7–4.0)
MCH: 29.1 pg (ref 26.0–34.0)
MCHC: 30.9 g/dL (ref 30.0–36.0)
MCV: 94.1 fL (ref 78.0–100.0)
Monocytes Absolute: 0.6 10*3/uL (ref 0.1–1.0)
Monocytes Relative: 5 %
NEUTROS PCT: 81 %
Neutro Abs: 9.6 10*3/uL — ABNORMAL HIGH (ref 1.7–7.7)
PLATELETS: 155 10*3/uL (ref 150–400)
RBC: 4.09 MIL/uL (ref 3.87–5.11)
RDW: 12.7 % (ref 11.5–15.5)
WBC: 11.8 10*3/uL — AB (ref 4.0–10.5)

## 2015-04-14 LAB — URINE CULTURE: Culture: NO GROWTH

## 2015-04-14 LAB — LEGIONELLA PNEUMOPHILA SEROGP 1 UR AG: L. PNEUMOPHILA SEROGP 1 UR AG: NEGATIVE

## 2015-04-14 LAB — HIV ANTIBODY (ROUTINE TESTING W REFLEX): HIV Screen 4th Generation wRfx: NONREACTIVE

## 2015-04-14 LAB — MRSA PCR SCREENING: MRSA by PCR: NEGATIVE

## 2015-04-14 MED ORDER — IPRATROPIUM-ALBUTEROL 0.5-2.5 (3) MG/3ML IN SOLN: 3.0000 mL | RESPIRATORY_TRACT | Status: DC | PRN

## 2015-04-14 MED ORDER — IPRATROPIUM-ALBUTEROL 0.5-2.5 (3) MG/3ML IN SOLN
3.0000 mL | Freq: Four times a day (QID) | RESPIRATORY_TRACT | Status: DC
  Administered 2015-04-15: 3 mL via RESPIRATORY_TRACT
  Filled 2015-04-14: qty 3

## 2015-04-14 MED ORDER — CALCITONIN (SALMON) 200 UNIT/ACT NA SOLN
1.0000 | Freq: Every day | NASAL | Status: DC
  Administered 2015-04-15 – 2015-04-17 (×3): 1 via NASAL
  Filled 2015-04-14 (×2): qty 3.7

## 2015-04-14 MED ORDER — ADULT MULTIVITAMIN W/MINERALS CH
1.0000 | ORAL_TABLET | Freq: Every day | ORAL | Status: DC
  Administered 2015-04-14 – 2015-04-17 (×4): 1 via ORAL
  Filled 2015-04-14 (×3): qty 1

## 2015-04-14 MED ORDER — KETOROLAC TROMETHAMINE 30 MG/ML IJ SOLN
30.0000 mg | Freq: Four times a day (QID) | INTRAMUSCULAR | Status: AC | PRN
  Administered 2015-04-14: 30 mg via INTRAVENOUS
  Filled 2015-04-14: qty 1

## 2015-04-14 MED ORDER — KETOROLAC TROMETHAMINE 30 MG/ML IJ SOLN
30.0000 mg | Freq: Once | INTRAMUSCULAR | Status: AC
  Administered 2015-04-14: 30 mg via INTRAVENOUS
  Filled 2015-04-14: qty 1

## 2015-04-14 MED ORDER — SODIUM CHLORIDE 0.9 % IV SOLN
INTRAVENOUS | Status: AC
  Administered 2015-04-14: 1000 mL via INTRAVENOUS
  Administered 2015-04-14: 100 mL via INTRAVENOUS

## 2015-04-14 MED ORDER — CITALOPRAM HYDROBROMIDE 40 MG PO TABS
20.0000 mg | ORAL_TABLET | Freq: Every day | ORAL | Status: DC
  Administered 2015-04-14 – 2015-04-17 (×4): 20 mg via ORAL
  Filled 2015-04-14 (×4): qty 1

## 2015-04-14 MED ORDER — CALCITONIN (SALMON) 200 UNIT/ACT NA SOLN
1.0000 | Freq: Every day | NASAL | Status: DC
  Administered 2015-04-14: 1 via NASAL
  Filled 2015-04-14: qty 3.7

## 2015-04-14 MED ORDER — SODIUM CHLORIDE 0.9 % IV SOLN: INTRAVENOUS | Status: DC | PRN

## 2015-04-14 MED ORDER — BOOST / RESOURCE BREEZE PO LIQD
1.0000 | Freq: Three times a day (TID) | ORAL | Status: DC
  Administered 2015-04-14 – 2015-04-17 (×4): 1 via ORAL

## 2015-04-14 MED ORDER — MIRTAZAPINE 30 MG PO TABS
30.0000 mg | ORAL_TABLET | Freq: Every day | ORAL | Status: DC
  Administered 2015-04-14 – 2015-04-16 (×4): 30 mg via ORAL
  Filled 2015-04-14 (×5): qty 1

## 2015-04-14 MED ORDER — PNEUMOCOCCAL VAC POLYVALENT 25 MCG/0.5ML IJ INJ
0.5000 mL | INJECTION | INTRAMUSCULAR | Status: AC
  Administered 2015-04-15: 0.5 mL via INTRAMUSCULAR
  Filled 2015-04-14: qty 0.5
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
## 2015-04-15 ENCOUNTER — Inpatient Hospital Stay (HOSPITAL_COMMUNITY): Payer: Medicare Other

## 2015-04-15 DIAGNOSIS — Z79891 Long term (current) use of opiate analgesic: Secondary | ICD-10-CM

## 2015-04-15 DIAGNOSIS — S22078A Other fracture of T9-T10 vertebra, initial encounter for closed fracture: Secondary | ICD-10-CM | POA: Diagnosis not present

## 2015-04-15 MED ORDER — ACETAMINOPHEN 325 MG PO TABS: 650.0000 mg | ORAL_TABLET | Freq: Four times a day (QID) | ORAL | Status: DC | PRN

## 2015-04-15 MED ORDER — IPRATROPIUM-ALBUTEROL 0.5-2.5 (3) MG/3ML IN SOLN
3.0000 mL | Freq: Three times a day (TID) | RESPIRATORY_TRACT | Status: DC
  Administered 2015-04-15 – 2015-04-17 (×5): 3 mL via RESPIRATORY_TRACT
  Filled 2015-04-15 (×6): qty 3

## 2015-04-15 MED ORDER — OXYCODONE HCL 5 MG PO TABS
15.0000 mg | ORAL_TABLET | Freq: Four times a day (QID) | ORAL | Status: DC
  Administered 2015-04-15 – 2015-04-16 (×4): 15 mg via ORAL
  Filled 2015-04-15 (×4): qty 3

## 2015-04-15 MED ORDER — MORPHINE SULFATE (PF) 2 MG/ML IV SOLN
1.0000 mg | INTRAVENOUS | Status: DC | PRN
  Administered 2015-04-16: 1 mg via INTRAVENOUS
  Filled 2015-04-15: qty 1

## 2015-04-15 NOTE — Consult Note (Addendum)
WOC ostomy consult note Stoma type/location:  Consult requested for ostomy assistance.  Ileostomy has been in place prior to admission and patient states she is independent with pouch application and emptying when at home.  She does not have any supplies with her during this admission.  She uses a convex 2 piece system; we do not have any convex 2 piece pouches available in the formulary at [facility]. She also uses a barrier ring and belt. Checked free samples in the WOC office and left her with 2 flexible one piece convex pouches with belt loops from [employer] and 2 barrier rings; patient states she will change when needed. Current pouch is intact with good seal.  Supplies at bedside and patient denies further questions or need for assistance. Please re-consult if further assistance is needed.  Thank-you,  Cammie Mcgee MSN, RN, CWOCN, [place], CNS [phone]
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
## 2015-04-15 NOTE — Progress Notes (Signed)
  Date: 04/15/2015  Patient name: Misty [name]  Medical record number: [id number]  Date of birth: 11/12/1950   This patient has been seen and the plan of care was discussed with the house staff. Please see their note for complete details. I concur with their findings with the following additions/corrections: Ms Jacobi was in bed and could not get comfortable. Our main discussion focused around pain control and dispo plans. Her desire is to use morphine IV until the moment she is D/C'd which is not realistic bc that will not tell us if our PO regimen is effective. 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 Yesterday, she got 2 doses of 1 mg IV morphine and 2 doses of oral oxycodone 10 mg. Her home opioid regimen had been oxycodone 10 mg and getting 120 for 30 days but increased to 15 mg #120 pills in Nov. So her inpatient use is actually less than her home regimen. Drs Ala Dach and Senaida Ores to cont to work with pt to get her converted to PO pain regimen. I am concerned about her ability to function safely at home - possible if she has 24 hr assistance but pt brought up that her family thinks she is an addict so will need to verify that she has assist at home.  Goals Convert to PO pain regimen Cont PT and OT Work towards safe D/C plan - either home or SNF Likely D/C on the 2nd or 3rd.  Burns Spain, MD 04/15/2015, 2:29 PM
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
## 2015-04-16 LAB — GLUCOSE, CAPILLARY: Glucose-Capillary: 92 mg/dL (ref 65–99)

## 2015-04-16 LAB — CBC
HEMATOCRIT: 37.3 % (ref 36.0–46.0)
HEMOGLOBIN: 11.4 g/dL — AB (ref 12.0–15.0)
MCH: 29 pg (ref 26.0–34.0)
MCHC: 30.6 g/dL (ref 30.0–36.0)
MCV: 94.9 fL (ref 78.0–100.0)
PLATELETS: 229 10*3/uL (ref 150–400)
RBC: 3.93 MIL/uL (ref 3.87–5.11)
RDW: 12.7 % (ref 11.5–15.5)
WBC: 9.3 10*3/uL (ref 4.0–10.5)

## 2015-04-16 MED ORDER — OXYCODONE HCL 5 MG PO TABS
15.0000 mg | ORAL_TABLET | Freq: Four times a day (QID) | ORAL | Status: DC | PRN
  Administered 2015-04-16 – 2015-04-17 (×3): 15 mg via ORAL
  Filled 2015-04-16 (×3): qty 3

## 2015-04-16 MED ORDER — OXYCODONE HCL 5 MG PO TABS: 5.0000 mg | ORAL_TABLET | ORAL | Status: DC | PRN

## 2015-04-16 NOTE — Clinical Social Work Placement (Signed)
   CLINICAL SOCIAL WORK PLACEMENT  NOTE  Date:  04/16/2015  Patient Details  Name: Misty [name] MRN: [id number] Date of Birth: 06-Dec-1950  Clinical Social Work is seeking post-discharge placement for this patient at the Skilled  Nursing Facility level of care (*CSW will initial, date and re-position this form in  chart as items are completed):  Yes   Patient/family provided with [facility] Clinical Social Work Department's list of facilities offering this level of care within the geographic area requested by the patient (or if unable, by the patient's family).  Yes   Patient/family informed of their freedom to choose among providers that offer the needed level of care, that participate in Medicare, Medicaid or managed care program needed by the patient, have an available bed and are willing to accept the patient.  Yes   Patient/family informed of [facility]'s ownership interest in [facility] and [facility], as well as of the fact that they are under no obligation to receive care at these facilities.  PASRR submitted to EDS on 04/16/15     PASRR number received on 04/16/15     Existing PASRR number confirmed on       FL2 transmitted to all facilities in geographic area requested by pt/family on       FL2 transmitted to all facilities within larger geographic area on       Patient informed that his/her managed care company has contracts with or will negotiate with certain facilities, including the following:            Patient/family informed of bed offers received.  Patient chooses bed at       Physician recommends and patient chooses bed at      Patient to be transferred to   on  .  Patient to be transferred to facility by       Patient family notified on   of transfer.  Name of family member notified:        PHYSICIAN       Additional Comment:    _______________________________________________ Venita Lick, LCSW 04/16/2015, 1:01 PM

## 2015-04-16 NOTE — Care Management Important Message (Signed)
Important Message  Patient Details  Name: Misty [name] MRN: [id number] Date of Birth: 05/22/1950   Medicare Important Message Given:  Yes    Hardy Harcum P Winfred Redel 04/16/2015, 1:50 PM

## 2015-04-16 NOTE — Progress Notes (Addendum)
Blood culture drawn on 04/13/15 aerobic bottle had grown gram + cocci in clusters.  Information given to Dr Ala Dach.
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
## 2015-04-16 NOTE — Progress Notes (Signed)
Pt's d/c/SNF placement discussed with MD.  CSW will f/u with pt in the am re: disposition.  Pollyann Savoy LCSW 2W Coverage [phone]

## 2015-04-16 NOTE — Progress Notes (Signed)
  Date: 04/16/2015  Patient name: Misty [name]  Medical record number: [id number]  Date of birth: [birth year]   This patient has been seen and the plan of care was discussed with the house staff. Please see their note for complete details. I concur with their findings with the following additions/corrections: Ms Sieverding was sitting in recliner when we entered and was breathing comfortably and did not appear to be in pain. As the interview cont, she became tearful and c/o 7-10 out of 10 pain. She is now on oxycodone 15 TID scheduled with 5 mg PRN break through pain but did not receive any. She got one dose of morphine early this AM. Her pain seems to be controlled on this current dose and she is not over sedated. Of note, a pill bottle was seen in her open purse but the label could not been seen without manipulating her purse. She was quite emotional yesterday about the assumption that we thought she was a drug seeker so I did not feel it wise to ask her permission to search her purse. She does have a cough, no productive, and would be at high risk for PNA as she has pain with coughing. We instructed her in use of IS. Likely D/C today to SNF if she is willing.   Burns Spain, MD 04/16/2015, 1:06 PM
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
## 2015-04-16 NOTE — Discharge Instructions (Signed)
Misty [name], it was a pleasure taking care of you in the hospital. It appears that your shortness of breath is related to the pain in your back, and you are unable to take a full breath. We are continuing you on your home pain regimen of 15 mg oxycodone every six hours as needed.   Because you are on opioids for pain, you may be at risk of taking too much, which can decrease your breathing. This can be dangerous. Therefore, we are prescribing naloxone, which can reverse the effects of the opioids in case of an overdose.  YOU SHOULD NOT DRIVE OR OPERATE MACHINERY ON PAIN MEDICATION. DOING SO COULD PUT YOU AND OTHERS AT RISK.  As we discussed, your recovery time in the rehab center will extend past a couple weeks. I have made arrangements with your sister to have your animals taken care of while you are recovering.
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
## 2015-04-17 LAB — CULTURE, BLOOD (ROUTINE X 2)

## 2015-04-17 LAB — GLUCOSE, CAPILLARY: Glucose-Capillary: 94 mg/dL (ref 65–99)

## 2015-04-17 MED ORDER — CITALOPRAM HYDROBROMIDE 20 MG PO TABS: 20.0000 mg | ORAL_TABLET | Freq: Every day | ORAL | Status: DC

## 2015-04-17 MED ORDER — LIDOCAINE 5 % EX PTCH: 1.0000 | MEDICATED_PATCH | CUTANEOUS | Status: AC

## 2015-04-17 MED ORDER — NICOTINE 7 MG/24HR TD PT24: 7.0000 mg | MEDICATED_PATCH | Freq: Every day | TRANSDERMAL | Status: AC

## 2015-04-17 MED ORDER — MIRTAZAPINE 30 MG PO TABS: 30.0000 mg | ORAL_TABLET | Freq: Every day | ORAL | Status: AC

## 2015-04-17 MED ORDER — SENNOSIDES-DOCUSATE SODIUM 8.6-50 MG PO TABS: 1.0000 | ORAL_TABLET | Freq: Every evening | ORAL | Status: AC | PRN

## 2015-04-17 MED ORDER — OXYCODONE HCL 15 MG PO TABS: 15.0000 mg | ORAL_TABLET | Freq: Four times a day (QID) | ORAL | Status: AC | PRN

## 2015-04-17 MED ORDER — IPRATROPIUM-ALBUTEROL 0.5-2.5 (3) MG/3ML IN SOLN: 3.0000 mL | RESPIRATORY_TRACT | Status: DC | PRN

## 2015-04-17 MED ORDER — ACETAMINOPHEN 325 MG PO TABS: 650.0000 mg | ORAL_TABLET | Freq: Four times a day (QID) | ORAL | Status: AC | PRN

## 2015-04-17 MED ORDER — CALCIUM CARBONATE-VITAMIN D 500-200 MG-UNIT PO TABS: 1.0000 | ORAL_TABLET | Freq: Two times a day (BID) | ORAL | Status: DC

## 2015-04-17 MED ORDER — DM-GUAIFENESIN ER 30-600 MG PO TB12: 1.0000 | ORAL_TABLET | Freq: Two times a day (BID) | ORAL | Status: AC

## 2015-04-17 NOTE — Progress Notes (Signed)
Pt discharged to [facility] via [facility] / stretcher and accompanied by two attendants.  Report given to Lupita Leash, RN prior to discharge.  Sister informed by patient at time of discharge.

## 2015-04-17 NOTE — Progress Notes (Signed)
Patient medicated within the past hour with 15 mg. Of Oxycodone as ordered.  Approximately 20 minutes later, she called the dest, stating that the medication I had given her "was not working" and was sobbing.  I observed over the next 5 minutes that she was eating and talking / laughing on phone with sister.  Distractions appear to ease her discomfort.

## 2015-04-17 NOTE — Clinical Social Work Placement (Signed)
   CLINICAL SOCIAL WORK PLACEMENT  NOTE  Date:  04/17/2015  Patient Details  Name: Misty [name] MRN: [id number] Date of Birth: [birth year]  Clinical Social Work is seeking post-discharge placement for this patient at the [place] level of care (*CSW will initial, date and re-position this form in  chart as items are completed):  Yes   Patient/family provided with [place] Work Department's list of facilities offering this level of care within the geographic area requested by the patient (or if unable, by the patient's family).  Yes   Patient/family informed of their freedom to choose among providers that offer the needed level of care, that participate in Medicare, Medicaid or managed care program needed by the patient, have an available bed and are willing to accept the patient.  Yes   Patient/family informed of [facility]'s ownership interest in [facility] and [facility], as well as of the fact that they are under no obligation to receive care at these facilities.  PASRR submitted to EDS on 04/16/15     PASRR number received on 04/16/15     Existing PASRR number confirmed on       FL2 transmitted to all facilities in geographic area requested by pt/family on 04/16/15     FL2 transmitted to all facilities within larger geographic area on       Patient informed that his/her managed care company has contracts with or will negotiate with certain facilities, including the following:        Yes   Patient/family informed of bed offers received.  Patient chooses bed at [facility]     Physician recommends and patient chooses bed at      Patient to be transferred to [facility] on 04/17/15.  Patient to be transferred to facility by Ambulance [facility])     Patient family notified on 04/17/15 of transfer.  Name of family member notified:  Sister [place]       PHYSICIAN Please prepare  priority discharge summary, including medications, Please prepare prescriptions, Please sign FL2     Additional Comment:  Ok per MD for d/c today to SNF for short term rehab. CSW met with patient- she was extremely tearful stating "I don't want to go!"  CSW discussed PT and MD recommendations for rehab and benefits of same.  Patient was able to acknowledge this but remains very worried about her "animals".  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  She admits that she is weak but still wanted to go home.  After an extended discussion- patient is agreeable to SNF short term. CSW provided support and encouragement; she was reminded that the harder she works with therapy the sooner she can return home.  Nursing notified to call report; DC summary sent to facility for review.  No further CSW needs identified. CSW signing off.     _______________________________________________ Williemae Area, LCSW 04/17/2015, 2:54 PM
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
## 2015-04-18 LAB — CULTURE, BLOOD (ROUTINE X 2): CULTURE: NO GROWTH

## 2015-04-19 ENCOUNTER — Encounter: Payer: Self-pay | Admitting: Adult Health

## 2015-04-19 ENCOUNTER — Non-Acute Institutional Stay (SKILLED_NURSING_FACILITY): Payer: Medicare Other | Admitting: Adult Health

## 2015-04-19 DIAGNOSIS — S22000S Wedge compression fracture of unspecified thoracic vertebra, sequela: Secondary | ICD-10-CM

## 2015-04-19 DIAGNOSIS — E43 Unspecified severe protein-calorie malnutrition: Secondary | ICD-10-CM

## 2015-04-19 DIAGNOSIS — Z720 Tobacco use: Secondary | ICD-10-CM

## 2015-04-19 DIAGNOSIS — G894 Chronic pain syndrome: Secondary | ICD-10-CM

## 2015-04-19 DIAGNOSIS — M810 Age-related osteoporosis without current pathological fracture: Secondary | ICD-10-CM | POA: Diagnosis not present

## 2015-04-19 DIAGNOSIS — F1190 Opioid use, unspecified, uncomplicated: Secondary | ICD-10-CM | POA: Diagnosis not present

## 2015-04-19 DIAGNOSIS — K6389 Other specified diseases of intestine: Secondary | ICD-10-CM | POA: Diagnosis not present

## 2015-04-19 DIAGNOSIS — K529 Noninfective gastroenteritis and colitis, unspecified: Secondary | ICD-10-CM

## 2015-04-19 DIAGNOSIS — F17200 Nicotine dependence, unspecified, uncomplicated: Secondary | ICD-10-CM

## 2015-04-19 DIAGNOSIS — J431 Panlobular emphysema: Secondary | ICD-10-CM

## 2015-04-19 MED ORDER — CALCITONIN (SALMON) 200 UNIT/ACT NA SOLN: 1.0000 | Freq: Every day | NASAL | Status: AC

## 2015-04-19 NOTE — Progress Notes (Signed)
This encounter was created in error - please disregard.
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
## 2015-04-20 ENCOUNTER — Non-Acute Institutional Stay (SKILLED_NURSING_FACILITY): Payer: Medicare Other | Admitting: Internal Medicine

## 2015-04-20 ENCOUNTER — Encounter: Payer: Self-pay | Admitting: Internal Medicine

## 2015-04-20 DIAGNOSIS — S22000S Wedge compression fracture of unspecified thoracic vertebra, sequela: Secondary | ICD-10-CM

## 2015-04-20 DIAGNOSIS — F17200 Nicotine dependence, unspecified, uncomplicated: Secondary | ICD-10-CM

## 2015-04-20 DIAGNOSIS — E43 Unspecified severe protein-calorie malnutrition: Secondary | ICD-10-CM

## 2015-04-20 DIAGNOSIS — G894 Chronic pain syndrome: Secondary | ICD-10-CM | POA: Diagnosis not present

## 2015-04-20 DIAGNOSIS — J431 Panlobular emphysema: Secondary | ICD-10-CM

## 2015-04-20 DIAGNOSIS — Z720 Tobacco use: Secondary | ICD-10-CM | POA: Diagnosis not present

## 2015-04-20 DIAGNOSIS — K529 Noninfective gastroenteritis and colitis, unspecified: Secondary | ICD-10-CM

## 2015-04-20 DIAGNOSIS — K6389 Other specified diseases of intestine: Secondary | ICD-10-CM | POA: Diagnosis not present

## 2015-04-20 DIAGNOSIS — Z932 Ileostomy status: Secondary | ICD-10-CM

## 2015-04-20 DIAGNOSIS — M810 Age-related osteoporosis without current pathological fracture: Secondary | ICD-10-CM | POA: Diagnosis not present

## 2015-04-20 NOTE — Progress Notes (Signed)
This encounter was created in error - please disregard.
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
## 2015-04-26 ENCOUNTER — Telehealth: Payer: Self-pay | Admitting: Internal Medicine

## 2015-04-26 NOTE — Telephone Encounter (Signed)
Mia from [facility] pharmacy requesting PA on Duoneb and Lidocaine patch.

## 2015-04-28 ENCOUNTER — Other Ambulatory Visit: Payer: Self-pay

## 2015-04-28 DIAGNOSIS — J439 Emphysema, unspecified: Secondary | ICD-10-CM

## 2015-04-28 DIAGNOSIS — S22000S Wedge compression fracture of unspecified thoracic vertebra, sequela: Secondary | ICD-10-CM

## 2015-04-28 MED ORDER — IPRATROPIUM-ALBUTEROL 0.5-2.5 (3) MG/3ML IN SOLN: 3.0000 mL | RESPIRATORY_TRACT | Status: AC | PRN

## 2015-04-28 MED ORDER — DICLOFENAC EPOLAMINE 1.3 % TD PTCH: 1.0000 | MEDICATED_PATCH | Freq: Two times a day (BID) | TRANSDERMAL | Status: AC

## 2015-04-28 NOTE — Telephone Encounter (Signed)
Sent in rx for flector patches and for Duoneb treatments. What does PA stand for?

## 2015-04-28 NOTE — Telephone Encounter (Signed)
PA request came for pt rx for lidocaine patch and duo-neb.   Pt insurance does not cover lidocaine patch, there are no patches on their formulary, however, they suggest doing a rx for flector patches and trying another PA (due to type of pain).    Duo-neb can be billed under medicare part B but new RX with ICD-10 code needs to be sent to pharmacy.

## 2015-04-29 NOTE — Telephone Encounter (Signed)
Sorry, prior authorization- insurance requirement

## 2015-04-30 ENCOUNTER — Non-Acute Institutional Stay (SKILLED_NURSING_FACILITY): Payer: Medicare Other | Admitting: Adult Health

## 2015-04-30 DIAGNOSIS — I5032 Chronic diastolic (congestive) heart failure: Secondary | ICD-10-CM | POA: Diagnosis not present

## 2015-04-30 DIAGNOSIS — J431 Panlobular emphysema: Secondary | ICD-10-CM | POA: Diagnosis not present

## 2015-04-30 DIAGNOSIS — K6389 Other specified diseases of intestine: Secondary | ICD-10-CM | POA: Diagnosis not present

## 2015-04-30 DIAGNOSIS — K529 Noninfective gastroenteritis and colitis, unspecified: Secondary | ICD-10-CM

## 2015-05-11 ENCOUNTER — Other Ambulatory Visit: Payer: Self-pay | Admitting: Internal Medicine
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
## 2015-05-12 ENCOUNTER — Other Ambulatory Visit: Payer: Self-pay | Admitting: Internal Medicine

## 2015-05-12 NOTE — Telephone Encounter (Signed)
It appears she is currently residing in a nursing home.  Her d/c summary from last hospitalization and her medication list at the nursing home do not list Ambien.    2 questions - -  Is she still residing at [facility]?  If so, she does not need refills from [place]  Was she supposed to stop her Remus Loffler given her respiratory failure?  She will need an appointment with PCP to address this.    No refill as of yet until we know more information.

## 2015-05-15 ENCOUNTER — Encounter: Payer: Self-pay | Admitting: Adult Health

## 2015-05-15 NOTE — Progress Notes (Signed)
Patient ID: Misty [name], female   DOB: 1951-04-04, 64 y.o.   MRN: [id number]   [facility]      Allergies  Allergen Reactions  . Codeine Other (See Comments)    Very bad headaches   . Tramadol Nausea And Vomiting    Chief Complaint  Patient presents with  . Discharge Note    HPI:  She is being discharged to home. She has declined all home health and has declined home oxygen. We had a prolonged discussion regarding the importance of utilizing these services. She told me that she has too many cats and dogs in house and will not allow anyone in her house. Her 02 sat are 87% on room air and she is aware of this. She will need her prescriptions and will need to follow up with her pcp. She will not need dme.    Past Medical History  Diagnosis Date  . Crohn's disease (HCC) 1980's     diagnosis made in 1980s, status post multiple surgeries including ileostomy and total colectomy; November 29 2009 single contrast ileostomy enema performed and normal ileostomy study following total colectomy was noted with no evidence of Crohn's dz;  Marland Kitchen Depression   . Substance abuse     tobacco  . Insomnia   . Polymorphic ventricular tachycardia (HCC)     drug-induced QT prolongation  . 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 Protein calorie malnutrition (HCC)   . Crohn's disease (HCC)   . COPD (chronic obstructive pulmonary disease) (HCC)   . Chronic, continuous use of opioids   . Bronchitis, chronic obstructive w acute bronchitis (HCC) 04/14/2015  . IBD (inflammatory bowel disease) 04/14/2015  . Ileostomy in place [facility]) 04/14/2015    Past Surgical History  Procedure Laterality Date  . Colectomy  1980's     status post colectomy with right lower quadrant ileostomy, this was performed in 1980s and we have no details of the procedures,  however this was confirmed by multiple CTs of the abdomen and pelvis, last CT of the abdomen and pelvis June 18, 2010 showed no evidence of  active Crohn's disease or complications  . Laparoscopy  04/13/2010     diagnostic laparoscopy, lysis of adhesions and small bowel resection with ileostomy revision, done by Dr. Dwain Sarna;  postoperative diagnoses =  difficulty with ileostomy and ileostomy stricture,  pathology report April 13, 2010 -->  ileostomy with focal ulceration, granulation tissue and fibrosis  . Colon stricture dilatation    . Bowel resection    . Tubal ligation    . Oophorectomy    . Colostomy      VITAL SIGNS BP 94/48 mmHg  Pulse 60  Ht  (1.651 m)  Wt 112 lb (50.803 kg)  BMI 18.64 kg/m2  SpO2 90%  LMP 01/26/1990  Patient's Medications  ACETAMINOPHEN (TYLENOL) 325 MG TABLET    Take 2 tablets (650 mg total) by mouth every 6 (six) hours as needed for moderate pain.  CALCIUM-VITAMIN D (OSCAL WITH D) 500-200 MG-UNIT TABLET    Take 1 tablet by mouth 2 (two) times daily.  CITALOPRAM (CELEXA) 20 MG TABLET    Take 1 tablet (20 mg total) by mouth daily.  DEXTROMETHORPHAN-GUAIFENESIN (MUCINEX DM) 30-600 MG 12HR TABLET    Take 1 tablet by mouth 2 (two) times daily.  GABAPENTIN (NEURONTIN) 800 MG TABLET    Take 800 mg by mouth 4 (four) times daily - after meals and at bedtime.  IPRATROPIUM-ALBUTEROL (DUONEB) 0.5-2.5 (3)  MG/3ML SOLN    Take 3 mLs by nebulization every 4 (four) hours as needed.  LIDOCAINE (LIDODERM) 5 %    Place 1 patch onto the skin daily. Remove & Discard patch within 12 hours or as directed by MD  METHOCARBAMOL (ROBAXIN) 750 MG TABLET    Take 750 mg by mouth 2 (two) times daily.  MIRTAZAPINE (REMERON) 30 MG TABLET    Take 1 tablet (30 mg total) by mouth at bedtime.  NICOTINE (NICODERM CQ - DOSED IN MG/24 HR) 7 MG/24HR PATCH    Place 1 patch (7 mg total) onto the skin daily.  OXYCODONE (ROXICODONE) 15 MG IMMEDIATE RELEASE TABLET    Take 1 tablet (15 mg total) by mouth every 6 (six) hours as needed for severe pain.  SENNA-DOCUSATE (SENOKOT-S) 8.6-50 MG TABLET    Take 1-2 tablets by mouth at bedtime  as needed for mild constipation.    New Prescriptions   No medications on file  Previous Medications  Modified Medications   No medications on file  Discontinued Medications   No medications on file     SIGNIFICANT DIAGNOSTIC EXAMS   04-13-15: pelvic x-ray: Negative  04-13-15: chest x-ray: 1. Left lower lobe airspace disease likely represents pneumonia. 2. Emphysema. 3. Thoracic compression fractures are likely remote. 4. No acute trauma.   04-13-15: ct of thoracic spine:1. Acute compression fracture of T10 with mild height loss. 2. Left lower lobe airspace disease with extensive airway debris. Trauma history suggests large volume aspiration; incidental pneumonia would have the same appearance. Follow-up to re- inflation is recommended.  04-13-15: ct of head and cervical spine: 1. No acute intracranial pathology. 2. No acute osseous injury of the cervical spine.   04-15-15: chest x-ray: Increasing retrocardiac density consistent with atelectasis or pneumonia. There is underlying COPD.  04-19-15: chest x-ray; 1. Patchy right base infiltrate; 1. patchy left base infiltrate/effusion    LABS REVIEWED:   04-13-15: wbc 16.5; hgb 13.3; hct 41.3; mcv 93.0; plt 212; glucose 110; bun 13; creat 1.14; k+ 4.7; na++134; liver normal albumin 3.3; blood culture: neg; urine culture: no growth: HIV: nr; urine rapid drug screed: +THC  04-16-15: wbc 9.3; hgb 11.4; hct 37.3; mcv 97.9; plt 229    Review of Systems  Constitutional: Negative for malaise/fatigue.  Respiratory: Positive for cough and shortness of breath.        Is on 02 4L   Cardiovascular: Negative for chest pain, palpitations and leg swelling.  Gastrointestinal: Negative for heartburn and abdominal pain.       Has ileostomy   Musculoskeletal: Positive for myalgias and back pain.  Skin: Negative.   Psychiatric/Behavioral: The patient is not nervous/anxious.       Physical Exam  Constitutional: No distress.  Frail     Eyes: Conjunctivae are normal.  Neck: Neck supple. No JVD present. No thyromegaly present.  Cardiovascular: Normal rate, regular rhythm and intact distal pulses.   Respiratory: No respiratory distress. She has no wheezes.  Has increased respiratory effort present Has rhonchi present bilateral lower lobes   GI: Soft. Bowel sounds are normal. She exhibits no distension. There is no tenderness.  Ileostomy present   Musculoskeletal: She exhibits no edema.  Able to move all extremities   Lymphadenopathy:    She has no cervical adenopathy.  Neurological: She is alert.  Skin: Skin is warm and dry. She is not diaphoretic.  Psychiatric: She has a normal mood and affect.    ASSESSMENT/ PLAN:  Will discharge to  home without any services at this time. Her prescriptions have been written for a 30 day supply of her medications with #30 oxycodone 15 mg tabs. She has a follow up with Dr. Karma Greaser on 06-02-15 at 3:15 pm.     Time spent with patient  45  minutes >50% time spent counseling; reviewing medical record; tests; labs; and developing future plan of care   Synthia Innocent NP [facility]  Contact [phone] Monday through Friday 8am- 5pm  After hours call [phone]

## 2015-05-18 ENCOUNTER — Telehealth: Payer: Self-pay | Admitting: Internal Medicine

## 2015-05-18 NOTE — Telephone Encounter (Signed)
Talked with pt - aware pharmacy has requested refill - still pending.

## 2015-05-18 NOTE — Telephone Encounter (Signed)
Pt requesting Ambien to be filled. °

## 2015-05-19 ENCOUNTER — Telehealth: Payer: Self-pay | Admitting: Internal Medicine

## 2015-05-19 NOTE — Telephone Encounter (Signed)
Please call pt back.

## 2015-05-20 ENCOUNTER — Encounter: Payer: Self-pay | Admitting: Pulmonary Disease

## 2015-05-20 ENCOUNTER — Ambulatory Visit (INDEPENDENT_AMBULATORY_CARE_PROVIDER_SITE_OTHER): Payer: Medicare Other | Admitting: Pulmonary Disease

## 2015-05-20 VITALS — BP 118/73 | HR 92 | Temp 97.6°F | Ht 65.0 in | Wt 108.0 lb

## 2015-05-20 DIAGNOSIS — G8929 Other chronic pain: Secondary | ICD-10-CM | POA: Diagnosis not present

## 2015-05-20 DIAGNOSIS — G4701 Insomnia due to medical condition: Secondary | ICD-10-CM

## 2015-05-20 MED ORDER — ZOLPIDEM TARTRATE 5 MG PO TABS: 5.0000 mg | ORAL_TABLET | Freq: Every evening | ORAL | Status: DC | PRN

## 2015-05-20 MED ORDER — ZOLPIDEM TARTRATE 5 MG PO TABS: 5.0000 mg | ORAL_TABLET | Freq: Every evening | ORAL | Status: AC | PRN

## 2015-05-20 NOTE — Addendum Note (Signed)
Addended by: Doneen Poisson D on: 05/20/2015 06:54 PM   Modules accepted: Orders
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
## 2015-05-20 NOTE — Telephone Encounter (Signed)
Agree with plan.  Her medications need to be reconciled thoroughly after discharge from [facility].
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
## 2015-05-20 NOTE — Patient Instructions (Signed)
Things you can do to improve your sleep:  Sleep as long as necessary to feel rested (usually seven to eight hours for adults) and then get out of bed Maintain a regular sleep schedule, particularly a regular wake-up time in the morning Try not to force sleep Avoid caffeinated beverages after lunch Avoid alcohol near bedtime (eg, late afternoon and evening) Avoid smoking or other nicotine intake, particularly during the evening Adjust the bedroom environment as needed to decrease stimuli (eg, reduce ambient light, turn off the television or radio) Avoid prolonged use of light-emitting screens (laptops, tablets, smartphones, ebooks) before bedtime Exercise regularly for at least 20 minutes, preferably more than four to five hours prior to bedtime Avoid daytime naps, especially if they are longer than 20 to 30 minutes or occur late in the day

## 2015-05-24 NOTE — Telephone Encounter (Signed)
Saw Dr Isabella Bowens on 05/20/15 and Ambien was refilled.

## 2015-06-02 ENCOUNTER — Ambulatory Visit (INDEPENDENT_AMBULATORY_CARE_PROVIDER_SITE_OTHER): Payer: Medicare Other | Admitting: Internal Medicine

## 2015-06-02 VITALS — BP 106/60 | HR 101 | Temp 98.0°F | Ht 65.0 in | Wt 120.6 lb

## 2015-06-02 DIAGNOSIS — F17210 Nicotine dependence, cigarettes, uncomplicated: Secondary | ICD-10-CM

## 2015-06-02 DIAGNOSIS — F17200 Nicotine dependence, unspecified, uncomplicated: Secondary | ICD-10-CM

## 2015-06-02 DIAGNOSIS — G4701 Insomnia due to medical condition: Secondary | ICD-10-CM

## 2015-06-02 MED ORDER — CITALOPRAM HYDROBROMIDE 40 MG PO TABS: 40.0000 mg | ORAL_TABLET | Freq: Every day | ORAL | Status: AC

## 2015-06-02 NOTE — Patient Instructions (Signed)
Thank you for coming in today  -I am increasing your Celexa from 20 mg to 40 mg daily -Please work on not having any screen time at night an hour before you go to bed. Also try to keep your dogs from sleeping with you.  -I would like to see you back in 1 month

## 2015-06-02 NOTE — Progress Notes (Signed)
Patient ID: Misty [name], female   DOB: Nov 04, 1950, 65 y.o.   MRN: [id number]   Subjective:   Patient ID: Misty [name] female   DOB: [birth year] 65 y.o.   MRN: [id number]  HPI: Misty [name] is a 65 y.o. woman with history of chronic diastolic heart failure, COPD, Crohn's disease status post total colectomy and end ileostomy in 1980s, osteoporosis, depression, tobacco use presenting for follow-up of her insomnia.  For details of today's visit and the status of her chronic medical issues please refer to the assessment and plan.  Past Medical History  Diagnosis Date  . Crohn's disease (HCC) 1980's     diagnosis made in 1980s, status post multiple surgeries including ileostomy and total colectomy; November 29 2009 single contrast ileostomy enema performed and normal ileostomy study following total colectomy was noted with no evidence of Crohn's dz;  Marland Kitchen Depression   . Substance abuse     tobacco  . Insomnia   . Polymorphic ventricular tachycardia (HCC)     drug-induced QT prolongation  . 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 Protein calorie malnutrition (HCC)   . Crohn's disease (HCC)   . COPD (chronic obstructive pulmonary disease) (HCC)   . Chronic, continuous use of opioids   . Bronchitis, chronic obstructive w acute bronchitis (HCC) 04/14/2015  . IBD (inflammatory bowel disease) 04/14/2015  . Ileostomy in place [facility]) 04/14/2015   Current Outpatient Prescriptions  Medication Sig Dispense Refill  . acetaminophen (TYLENOL) 325 MG tablet Take 2 tablets (650 mg total) by mouth every 6 (six) hours as needed for moderate pain. 30 tablet 0  . alendronate (FOSAMAX) 10 MG tablet TAKE 1 TABLET BY MOUTH DAILY BEFORE BREAKFAST. TAKE WITH FULL GLASS OF WATER ON AN EMPTY STOMACH 30 tablet 5  . aspirin 325 MG tablet Take 325 mg by mouth daily.     . calcitonin, salmon, (MIACALCIN) 200 UNIT/ACT nasal spray Place 1 spray into alternate nostrils daily. 3.7 mL 12  . calcium-vitamin D (OSCAL  WITH D) 500-200 MG-UNIT tablet Take 1 tablet by mouth 2 (two) times daily. 60 tablet 0  . citalopram (CELEXA) 40 MG tablet Take 1 tablet (40 mg total) by mouth daily. 30 tablet 2  . dextromethorphan-guaiFENesin (MUCINEX DM) 30-600 MG 12hr tablet Take 1 tablet by mouth 2 (two) times daily. 30 tablet 0  . diclofenac (FLECTOR) 1.3 % PTCH Place 1 patch onto the skin 2 (two) times daily. 30 patch 3  . gabapentin (NEURONTIN) 800 MG tablet Take 1 tablet (800 mg total) by mouth 3 (three) times daily. (Patient taking differently: Take 800 mg by mouth. Take one six times a day) 90 tablet 2  . gabapentin (NEURONTIN) 800 MG tablet Take 800 mg by mouth 4 (four) times daily - after meals and at bedtime.    Marland Kitchen ipratropium-albuterol (DUONEB) 0.5-2.5 (3) MG/3ML SOLN Take 3 mLs by nebulization every 4 (four) hours as needed. 360 mL 3  . lidocaine (LIDODERM) 5 % Place 1 patch onto the skin daily. Remove & Discard patch within 12 hours or as directed by MD 30 patch 0  . methocarbamol (ROBAXIN) 750 MG tablet Take 750 mg by mouth 2 (two) times daily.    . metoprolol succinate (TOPROL-XL) 25 MG 24 hr tablet Take 1 tablet (25 mg total) by mouth daily. 90 tablet 0  . mirtazapine (REMERON) 15 MG tablet Take 1 tablet (15 mg total) by mouth at bedtime. 31 tablet 5  . mirtazapine (REMERON) 30 MG tablet Take 30  mg by mouth at bedtime.  6  . mirtazapine (REMERON) 30 MG tablet Take 1 tablet (30 mg total) by mouth at bedtime. 30 tablet 0  . nicotine (NICODERM CQ - DOSED IN MG/24 HR) 7 mg/24hr patch Place 1 patch (7 mg total) onto the skin daily. 28 patch 0  . oxyCODONE (ROXICODONE) 15 MG immediate release tablet Take 1 tablet (15 mg total) by mouth every 6 (six) hours as needed for severe pain. 120 tablet 0  . promethazine (PHENERGAN) 12.5 MG tablet Take 1 tablet (12.5 mg total) by mouth every 8 (eight) hours as needed for nausea or vomiting. 30 tablet 0  . senna-docusate (SENOKOT-S) 8.6-50 MG tablet Take 1-2 tablets by mouth at  bedtime as needed for mild constipation. 30 tablet 0  . tiZANidine (ZANAFLEX) 4 MG tablet Take 4 mg by mouth 4 (four) times daily.  6  . zolpidem (AMBIEN) 5 MG tablet Take 1 tablet (5 mg total) by mouth at bedtime as needed for sleep. 30 tablet 0   No current facility-administered medications for this visit.   Family History  Problem Relation Age of Onset  . Crohn's disease Brother   . Cancer Father     lung  . Heart disease Father     heart attack   Social History   Social History  . Marital Status: Unknown    Spouse Name: N/A  . Number of Children: N/A  . Years of Education: N/A   Occupational History  .      disability   Social History Main Topics  . Smoking status: Current Every Day Smoker -- 1.00 packs/day for 30 years    Types: Cigarettes  . Smokeless tobacco: Not on file     Comment:  pack a day.  . Alcohol Use: No  . Drug Use: No  . Sexual Activity: Not on file   Other Topics Concern  . Not on file   Social History Narrative   ** Merged History Encounter **       Review of Systems: Constitutional: no fevers/chills Respiratory: no shortness of breath Cardiovascular: no chest pain Gastrointestinal: +chrinc abdominal pain Neurological: no paresthesias, no weakness  Objective:  Physical Exam: Filed Vitals:   06/02/15 1530  BP: 106/60  Pulse: 101  Temp: 98 F (36.7 C)  TempSrc: Oral  Height: 5\' 5"  (1.651 m)  Weight: 120 lb 9.6 oz (54.704 kg)  SpO2: 92%   GENERAL- thin, frail, appears older than stated age, somnolent CARDIAC- RRR, no murmurs, rubs or gallops. RESP- Moving equal volumes of air, and clear to auscultation bilaterally, no wheezes or crackles. ABDOMEN- Soft, nontender, bowel sounds present. SKIN- Warm, dry, No rash or lesion. PSYCH- Depressed mood and affect, appropriate thought content and speech.  Assessment & Plan:   Case discussed with Dr. Josem Kaufmann. Please refer to Problem based charting for further details of today's visit.

## 2015-06-04 ENCOUNTER — Encounter: Payer: Self-pay | Admitting: Internal Medicine

## 2015-06-04 NOTE — Assessment & Plan Note (Signed)
Patient reports she is trying to quit. She reports she is down to 3/4 ppd from previous 2 ppd. Declined any smoking cessation counseling or medications and would like to continue on her own.  Continue to encourage smoking cessation
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
## 2015-06-07 NOTE — Progress Notes (Signed)
Case discussed with Dr. Boswell at time of visit.  We reviewed the resident's history and exam and pertinent patient test results.  I agree with the assessment, diagnosis, and plan of care documented in the resident's note. 

## 2015-06-10 ENCOUNTER — Other Ambulatory Visit: Payer: Self-pay | Admitting: Internal Medicine

## 2015-11-17 ENCOUNTER — Other Ambulatory Visit: Payer: Self-pay | Admitting: Internal Medicine

## 2015-11-22 NOTE — Telephone Encounter (Signed)
Received faxed refill request from pharmacy-will resend info to pcp for review.  Please advise.Misty [name], Misty Guagliardo Cassady7/10/201710:50 AM

## 2015-11-23 NOTE — Telephone Encounter (Signed)
Please confirm patient is taking and schedule follow up. Last Rx was 05/2015 for 30 days and she was suppose to follow up in 1 month.

## 2015-11-24 ENCOUNTER — Telehealth: Payer: Self-pay

## 2015-11-24 NOTE — Telephone Encounter (Signed)
No answer

## 2015-11-24 NOTE — Telephone Encounter (Signed)
Please call pt back regarding med.  

## 2015-11-25 NOTE — Telephone Encounter (Signed)
LVM to return call.

## 2016-02-13 DEATH — deceased

## 2016-05-23 ENCOUNTER — Encounter: Payer: Self-pay | Admitting: Internal Medicine

## 2017-11-11 ENCOUNTER — Encounter: Payer: Self-pay | Admitting: *Deleted

## 2017-11-28 ENCOUNTER — Encounter: Payer: Self-pay | Admitting: Internal Medicine
